# Patient Record
Sex: Male | Born: 1955 | Race: Black or African American | Hispanic: No | Marital: Married | Smoking: Former smoker
Health system: Southern US, Community
[De-identification: ages and names within clinical notes are randomized; demographics above are authoritative.]

## PROBLEM LIST (undated history)

## (undated) DIAGNOSIS — I2510 Atherosclerotic heart disease of native coronary artery without angina pectoris: Secondary | ICD-10-CM

## (undated) DIAGNOSIS — E7800 Pure hypercholesterolemia, unspecified: Secondary | ICD-10-CM

## (undated) DIAGNOSIS — K219 Gastro-esophageal reflux disease without esophagitis: Secondary | ICD-10-CM

## (undated) DIAGNOSIS — L02213 Cutaneous abscess of chest wall: Secondary | ICD-10-CM

## (undated) HISTORY — DX: Cutaneous abscess of chest wall: L02.213

---

## 2005-05-17 HISTORY — PX: COLONOSCOPY: SHX174

## 2010-05-17 DIAGNOSIS — L02213 Cutaneous abscess of chest wall: Secondary | ICD-10-CM

## 2010-05-17 HISTORY — DX: Cutaneous abscess of chest wall: L02.213

## 2010-11-11 ENCOUNTER — Encounter (INDEPENDENT_AMBULATORY_CARE_PROVIDER_SITE_OTHER): Payer: Self-pay | Admitting: General Surgery

## 2010-11-12 ENCOUNTER — Ambulatory Visit (INDEPENDENT_AMBULATORY_CARE_PROVIDER_SITE_OTHER): Payer: BC Managed Care – PPO | Admitting: General Surgery

## 2010-11-12 ENCOUNTER — Encounter (INDEPENDENT_AMBULATORY_CARE_PROVIDER_SITE_OTHER): Payer: Self-pay | Admitting: General Surgery

## 2010-11-12 VITALS — Temp 97.1°F

## 2010-11-12 DIAGNOSIS — L02213 Cutaneous abscess of chest wall: Secondary | ICD-10-CM

## 2010-11-12 DIAGNOSIS — L02219 Cutaneous abscess of trunk, unspecified: Secondary | ICD-10-CM

## 2010-11-12 NOTE — Patient Instructions (Signed)
Return if knot reforms at site.

## 2010-11-12 NOTE — Progress Notes (Signed)
Subjective:     Patient ID: Jeffrey [name], male   DOB: 12/12/1955, 55 y.o.   MRN: [id number]    Temp(Src) 97.1 F (36.2 C) (Temporal)    HPI He is here for followup status post incision and drainage of his chest wall abscess. He states the wound has healed.  Review of Systems     Objective:   Physical Exam Chest: Well healed small left chest wall scar with no nodules.   Assessment:     Left chest wall abscess status post incision and drainage. Wound is healed. This most likely was an infected sebaceous cyst.   Plan:  Return to see me if a nodule reoccurs.

## 2015-11-14 DIAGNOSIS — Z125 Encounter for screening for malignant neoplasm of prostate: Secondary | ICD-10-CM | POA: Diagnosis not present

## 2015-11-14 DIAGNOSIS — Z1389 Encounter for screening for other disorder: Secondary | ICD-10-CM | POA: Diagnosis not present

## 2015-11-14 DIAGNOSIS — Z0000 Encounter for general adult medical examination without abnormal findings: Secondary | ICD-10-CM | POA: Diagnosis not present

## 2015-12-11 ENCOUNTER — Other Ambulatory Visit: Payer: Self-pay | Admitting: Gastroenterology

## 2015-12-25 DIAGNOSIS — E782 Mixed hyperlipidemia: Secondary | ICD-10-CM | POA: Diagnosis not present

## 2016-01-21 DIAGNOSIS — R079 Chest pain, unspecified: Secondary | ICD-10-CM | POA: Diagnosis not present

## 2016-01-21 DIAGNOSIS — K219 Gastro-esophageal reflux disease without esophagitis: Secondary | ICD-10-CM | POA: Diagnosis not present

## 2016-02-03 ENCOUNTER — Encounter (HOSPITAL_COMMUNITY): Payer: Self-pay | Admitting: *Deleted

## 2016-02-10 ENCOUNTER — Encounter (HOSPITAL_COMMUNITY): Payer: Self-pay | Admitting: *Deleted

## 2016-02-10 ENCOUNTER — Encounter (HOSPITAL_COMMUNITY)
Admission: RE | Disposition: A | Discharge status: Home / Self Care | Payer: Self-pay | Source: Ambulatory Visit | Attending: Gastroenterology

## 2016-02-10 ENCOUNTER — Ambulatory Visit (HOSPITAL_COMMUNITY): Payer: BLUE CROSS/BLUE SHIELD | Admitting: Anesthesiology

## 2016-02-10 ENCOUNTER — Ambulatory Visit (HOSPITAL_COMMUNITY)
Admission: RE | Admit: 2016-02-10 | Discharge: 2016-02-10 | Disposition: A | Discharge status: Home / Self Care | Payer: BLUE CROSS/BLUE SHIELD | Source: Ambulatory Visit | Attending: Gastroenterology | Admitting: Gastroenterology

## 2016-02-10 DIAGNOSIS — K219 Gastro-esophageal reflux disease without esophagitis: Secondary | ICD-10-CM | POA: Diagnosis not present

## 2016-02-10 DIAGNOSIS — E7800 Pure hypercholesterolemia, unspecified: Secondary | ICD-10-CM | POA: Diagnosis not present

## 2016-02-10 DIAGNOSIS — D120 Benign neoplasm of cecum: Secondary | ICD-10-CM | POA: Diagnosis not present

## 2016-02-10 DIAGNOSIS — K635 Polyp of colon: Secondary | ICD-10-CM | POA: Insufficient documentation

## 2016-02-10 DIAGNOSIS — Z6834 Body mass index (BMI) 34.0-34.9, adult: Secondary | ICD-10-CM | POA: Diagnosis not present

## 2016-02-10 DIAGNOSIS — E669 Obesity, unspecified: Secondary | ICD-10-CM | POA: Diagnosis not present

## 2016-02-10 DIAGNOSIS — Z1211 Encounter for screening for malignant neoplasm of colon: Secondary | ICD-10-CM | POA: Insufficient documentation

## 2016-02-10 DIAGNOSIS — E783 Hyperchylomicronemia: Secondary | ICD-10-CM | POA: Insufficient documentation

## 2016-02-10 DIAGNOSIS — D122 Benign neoplasm of ascending colon: Secondary | ICD-10-CM | POA: Diagnosis not present

## 2016-02-10 DIAGNOSIS — R9431 Abnormal electrocardiogram [ECG] [EKG]: Secondary | ICD-10-CM | POA: Insufficient documentation

## 2016-02-10 HISTORY — PX: COLONOSCOPY WITH PROPOFOL: SHX5780

## 2016-02-10 HISTORY — DX: Gastro-esophageal reflux disease without esophagitis: K21.9

## 2016-02-10 HISTORY — DX: Pure hypercholesterolemia, unspecified: E78.00

## 2016-02-10 SURGERY — COLONOSCOPY WITH PROPOFOL
Anesthesia: Monitor Anesthesia Care

## 2016-02-10 MED ORDER — LIDOCAINE 2% (20 MG/ML) 5 ML SYRINGE
INTRAMUSCULAR | Status: AC
  Filled 2016-02-10: qty 5

## 2016-02-10 MED ORDER — LIDOCAINE 2% (20 MG/ML) 5 ML SYRINGE
INTRAMUSCULAR | Status: DC | PRN
  Administered 2016-02-10: 50 mg via INTRAVENOUS

## 2016-02-10 MED ORDER — PROPOFOL 10 MG/ML IV BOLUS
INTRAVENOUS | Status: AC
  Filled 2016-02-10: qty 60

## 2016-02-10 MED ORDER — PROPOFOL 500 MG/50ML IV EMUL
INTRAVENOUS | Status: DC | PRN
  Administered 2016-02-10: 125 ug/kg/min via INTRAVENOUS

## 2016-02-10 MED ORDER — LACTATED RINGERS IV SOLN
INTRAVENOUS | Status: DC
  Administered 2016-02-10: 09:00:00 via INTRAVENOUS

## 2016-02-10 MED ORDER — SODIUM CHLORIDE 0.9 % IV SOLN: INTRAVENOUS | Status: DC

## 2016-02-10 MED ORDER — PROPOFOL 10 MG/ML IV BOLUS
INTRAVENOUS | Status: DC | PRN
  Administered 2016-02-10: 20 mg via INTRAVENOUS
  Administered 2016-02-10: 40 mg via INTRAVENOUS

## 2016-02-10 SURGICAL SUPPLY — 21 items

## 2016-02-10 NOTE — Transfer of Care (Signed)
Immediate Anesthesia Transfer of Care Note  Patient: Jeffrey [name]  Procedure(s) Performed: Procedure(s): COLONOSCOPY WITH PROPOFOL (N/A)  Patient Location: PACU and Endoscopy Unit  Anesthesia Type:MAC  Level of Consciousness: sedated and patient cooperative  Airway & Oxygen Therapy: Patient Spontanous Breathing and Patient connected to face mask oxygen  Post-op Assessment: Report given to RN and Post -op Vital signs reviewed and stable  Post vital signs: Reviewed and stable  Last Vitals:  Vitals:   02/10/16 0820  BP: 126/73  Pulse: 61  Resp: (!) 21  Temp: 36.4 C    Last Pain:  Vitals:   02/10/16 0905  TempSrc: Oral         Complications: No apparent anesthesia complications

## 2016-02-10 NOTE — Anesthesia Postprocedure Evaluation (Signed)
Anesthesia Post Note  Patient: Jeffrey [name]  Procedure(s) Performed: Procedure(s) (LRB): COLONOSCOPY WITH PROPOFOL (N/A)  Patient location during evaluation: PACU Anesthesia Type: MAC Level of consciousness: awake and alert Pain management: pain level controlled Vital Signs Assessment: post-procedure vital signs reviewed and stable Respiratory status: spontaneous breathing, nonlabored ventilation, respiratory function stable and patient connected to nasal cannula oxygen Cardiovascular status: stable and blood pressure returned to baseline Anesthetic complications: no    Last Vitals:  Vitals:   02/10/16 1000 02/10/16 1010  BP: (!) 145/92 124/79  Pulse: (!) 57 (!) 55  Resp: 18 16  Temp:      Last Pain:  Vitals:   02/10/16 0948  TempSrc: Oral                 [name]

## 2016-02-10 NOTE — Op Note (Signed)
[facility] Patient Name: Jeffrey [name] Procedure Date: 02/10/2016 MRN: [id number] Attending MD: Garlan Fair , MD Date of Birth: 11-19-1955 CSN: [id number] Age: 60 Admit Type: Outpatient Procedure:                Colonoscopy Indications:              Screening for colorectal malignant neoplasm Providers:                Garlan Fair, MD, Laverta Baltimore RN, RN, Alfonso Patten, Technician, Dione Booze, CRNA Referring MD:              Medicines:                Propofol per Anesthesia Complications:            No immediate complications. Estimated Blood Loss:     Estimated blood loss: none. Procedure:                Pre-Anesthesia Assessment:                           - Prior to the procedure, a History and Physical                            was performed, and patient medications and                            allergies were reviewed. The patient's tolerance of                            previous anesthesia was also reviewed. The risks                            and benefits of the procedure and the sedation                            options and risks were discussed with the patient.                            All questions were answered, and informed consent                            was obtained. Prior Anticoagulants: The patient has                            taken aspirin, last dose was 7 days prior to                            procedure. ASA Grade Assessment: II - A patient                            with mild systemic disease. After reviewing the  risks and benefits, the patient was deemed in                            satisfactory condition to undergo the procedure.                           After obtaining informed consent, the colonoscope                            was passed under direct vision. Throughout the                            procedure, the patient's blood pressure, pulse, and                     oxygen saturations were monitored continuously. The                            [id number] scope was introduced through                            the anus and advanced to the the cecum, identified                            by appendiceal orifice and ileocecal valve. The                            colonoscopy was somewhat difficult due to                            significant looping. The patient tolerated the                            procedure well. The quality of the bowel                            preparation was good. The terminal ileum, the                            ileocecal valve, the appendiceal orifice and the                            rectum were photographed. Scope In: 9:10:58 AM Scope Out: 9:42:56 AM Scope Withdrawal Time: 0 hours 17 minutes 13 seconds  Total Procedure Duration: 0 hours 31 minutes 58 seconds  Findings:      The perianal and digital rectal examinations were normal.      A 7 mm polyp was found in the cecum. The polyp was sessile. The polyp       was removed with a cold snare. Resection and retrieval were complete.      A 7 mm polyp was found in the cecum. The polyp was sessile. The polyp       was removed with a hot snare. Resection and retrieval were complete.      A 4 mm polyp was found in the ascending colon. The  polyp was sessile.       The polyp was removed with a cold snare. Resection and retrieval were       complete.      The exam was otherwise without abnormality. Impression:               - One 7 mm polyp in the cecum, removed with a cold                            snare. Resected and retrieved.                           - One 7 mm polyp in the cecum, removed with a hot                            snare. Resected and retrieved.                           - One 4 mm polyp in the ascending colon, removed                            with a cold snare. Resected and retrieved.                           - The examination was  otherwise normal. Moderate Sedation:      N/A- Per Anesthesia Care Recommendation:           - Patient has a contact number available for                            emergencies. The signs and symptoms of potential                            delayed complications were discussed with the                            patient. Return to normal activities tomorrow.                            Written discharge instructions were provided to the                            patient.                           - Repeat colonoscopy date to be determined after                            pending pathology results are reviewed for                            surveillance.                           - Resume previous diet.                           -  Continue present medications. Procedure Code(s):        --- Professional ---                           [phone], Colonoscopy, flexible; with removal of                            tumor(s), polyp(s), or other lesion(s) by snare                            technique Diagnosis Code(s):        --- Professional ---                           Z12.11, Encounter for screening for malignant                            neoplasm of colon                           D12.0, Benign neoplasm of cecum                           D12.2, Benign neoplasm of ascending colon CPT copyright 2016 American [employer] Association. All rights reserved. The codes documented in this report are preliminary and upon coder review may  be revised to meet current compliance requirements. Earle Gell, MD Garlan Fair, MD 02/10/2016 9:51:06 AM This report has been signed electronically. Number of Addenda: 0

## 2016-02-10 NOTE — Anesthesia Preprocedure Evaluation (Signed)
Anesthesia Evaluation  Patient identified by MRN, date of birth, ID band Patient awake    Reviewed: Allergy & Precautions, NPO status , Patient's Chart, lab work & pertinent test results  Airway Mallampati: II  TM Distance: >3 FB Neck ROM: Full    Dental no notable dental hx.    Pulmonary neg pulmonary ROS,    Pulmonary exam normal breath sounds clear to auscultation       Cardiovascular negative cardio ROS Normal cardiovascular exam Rhythm:Regular Rate:Normal     Neuro/Psych negative neurological ROS  negative psych ROS   GI/Hepatic negative GI ROS, Neg liver ROS, GERD  Medicated,  Endo/Other  negative endocrine ROS  Renal/GU negative Renal ROS  negative genitourinary   Musculoskeletal negative musculoskeletal ROS (+)   Abdominal (+) + obese,   Peds negative pediatric ROS (+)  Hematology negative hematology ROS (+)   Anesthesia Other Findings   Reproductive/Obstetrics negative OB ROS                             Anesthesia Physical Anesthesia Plan  ASA: II  Anesthesia Plan: MAC   Post-op Pain Management:    Induction: Intravenous  Airway Management Planned: Natural Airway  Additional Equipment:   Intra-op Plan:   Post-operative Plan:   Informed Consent: I have reviewed the patients History and Physical, chart, labs and discussed the procedure including the risks, benefits and alternatives for the proposed anesthesia with the patient or authorized representative who has indicated his/her understanding and acceptance.   Dental advisory given  Plan Discussed with: CRNA  Anesthesia Plan Comments:         Anesthesia Quick Evaluation

## 2016-02-10 NOTE — Discharge Instructions (Signed)

## 2016-02-10 NOTE — H&P (Signed)
Procedure: Screening colonoscopy. Normal baseline screening colonoscopy was performed on 09/29/2005  History: The patient is a 60 year old male born 02/08/1956. He is scheduled to undergo a repeat screening colonoscopy today.  Medication allergies: None  Past medical history: Hypercholesterolemia. Gastroesophageal reflux.  Family history: Negative for colon cancer  Exam: The patient is alert and lying comfortably on the endoscopy stretcher. Abdomen is soft and nontender to palpation. Lungs are clear to auscultation. Cardiac exam reveals a regular rhythm.  Plan: Proceed with screening colonoscopy

## 2016-02-10 NOTE — Anesthesia Procedure Notes (Signed)
Procedure Name: MAC Date/Time: 02/10/2016 9:06 AM Performed by: Dione Booze Pre-anesthesia Checklist: Patient identified, Emergency Drugs available, Suction available and Patient being monitored Patient Re-evaluated:Patient Re-evaluated prior to inductionOxygen Delivery Method: Simple face mask Placement Confirmation: positive ETCO2

## 2016-02-11 ENCOUNTER — Encounter: Payer: Self-pay | Admitting: Interventional Cardiology

## 2016-02-11 ENCOUNTER — Ambulatory Visit (INDEPENDENT_AMBULATORY_CARE_PROVIDER_SITE_OTHER): Payer: BLUE CROSS/BLUE SHIELD | Admitting: Interventional Cardiology

## 2016-02-11 VITALS — BP 152/90 | HR 66 | Ht 71.0 in | Wt 257.0 lb

## 2016-02-11 DIAGNOSIS — R0789 Other chest pain: Secondary | ICD-10-CM | POA: Diagnosis not present

## 2016-02-11 DIAGNOSIS — R079 Chest pain, unspecified: Secondary | ICD-10-CM | POA: Insufficient documentation

## 2016-02-11 DIAGNOSIS — R9431 Abnormal electrocardiogram [ECG] [EKG]: Secondary | ICD-10-CM | POA: Diagnosis not present

## 2016-02-11 DIAGNOSIS — E785 Hyperlipidemia, unspecified: Secondary | ICD-10-CM

## 2016-02-11 NOTE — Progress Notes (Addendum)
Cardiology Office Note    Date:  02/11/2016   ID:  Jeffrey [name], DOB [birth year], MRN [id number]  PCP:  Wenda Low, MD  Cardiologist: Sinclair Grooms, MD   Chief Complaint  Patient presents with  . Coronary Artery Disease  . Abnormal ECG    History of Present Illness:  Jeffrey [name] is a 60 y.o. male here for chest pain and abnormal EKG evaluation  60 year old who is an avid golfer. Over the past 3 months is noted recurring postprandial midsternal discomfort. One episode lasted nearly 3 hours. He feels that the discomfort improves with Tums. He only has it at night. He never has it playing golf. He is a good golfer and doesn't do that much walking when he plays because he is "in the [place]". No prior history of heart disease. He denies leg fatigue and discomfort with activity  He spoke to his physician, Dr. Deforest Hoyles about the discomfort. An EKG was done and was different than his prior tracing 17 years previous. Mrs. Kellner also feels that there is a correlation between when he began simvastatin and the onset of the chest discomfort.  Past Medical History:  Diagnosis Date  . Chest wall abscess 2012  . Elevated cholesterol   . GERD (gastroesophageal reflux disease)     Past Surgical History:  Procedure Laterality Date  . colonscopy      Current Medications: Outpatient Medications Prior to Visit  Medication Sig Dispense Refill  . aspirin 81 MG tablet Take 81 mg by mouth daily.      . famotidine (PEPCID) 10 MG tablet Take 10 mg by mouth daily as needed for heartburn or indigestion.    . simvastatin (ZOCOR) 10 MG tablet Take 10 mg by mouth every morning.     No facility-administered medications prior to visit.      Allergies:   Review of patient's allergies indicates no known allergies.   Social History   Social History  . Marital status: Married    Spouse name: N/A  . Number of children: N/A  . Years of education: N/A   Social History Main Topics  . Smoking  status: Passive Smoke Exposure - Never Smoker    Types: Cigars  . Smokeless tobacco: Never Used     Comment: occ cigars  . Alcohol use Yes     Comment: socially  . Drug use: No  . Sexual activity: Not Asked   Other Topics Concern  . None   Social History Narrative  . None     Family History:  The patient's family history includes Hypertension in his sister; Stroke in his brother.   ROS:   Please see the history of present illness.    Possible prior history of reflux or response to an acid therapy. History of sciatica. Erectile dysfunction. Hyperlipidemia.  All other systems reviewed and are negative.   PHYSICAL EXAM:   VS:  BP (!) 152/90   Pulse 66   Ht 5\' 11"  (1.803 m)   Wt 257 lb (116.6 kg)   BMI 35.84 kg/m    GEN: Well nourished, well developed, in no acute distress  HEENT: normal  Neck: no JVD, carotid bruits, or masses Cardiac: RRR; no murmurs, rubs, or gallops,no edema  Respiratory:  clear to auscultation bilaterally, normal work of breathing GI: soft, nontender, nondistended, + BS MS: no deformity or atrophy  Skin: warm and dry, no rash Neuro:  Alert and Oriented x 3, Strength and sensation are intact Psych: euthymic  mood, full affect  Wt Readings from Last 3 Encounters:  02/11/16 257 lb (116.6 kg)  02/10/16 245 lb (111.1 kg)      Studies/Labs Reviewed:   EKG:  EKG  Normal sinus rhythm, left axis deviation, ischemic T-wave abnormality in II, III, aVF, and V5 through V6. Small Q waves are noted.  Recent Labs: No results found for requested labs within last 8760 hours.   Lipid Panel No results found for: CHOL, TRIG, HDL, CHOLHDL, VLDL, LDLCALC, LDLDIRECT  Additional studies/ records that were reviewed today include:  Reviewed the EKG from [facility] from 2000, it was totally normal. The above changes are new.    ASSESSMENT:    1. Abnormal ECG   2. Chest discomfort   3. Hyperlipidemia      PLAN:  In order of problems listed above:  1. Could  represent ischemic injury to the heart. No ongoing discomfort. Definite change in appearance compared to 17 years ago. Ischemia is a concern. 2. The discomfort is only postprandial. It is not exertion related. Under the circumstances however, I am concerned about coronary artery disease. He needs a stress myocardial perfusion study done to exclude ischemia. If abnormal he would need to have coronary angiography. 3. Continue statin therapy. LDL target is 70. 81 mg aspirin daily is recommended.   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 Ejection fraction 43%. No significant areas of ischemia. Because of low EF and perfusion abnormality of the study is at least moderate risk.  No chest discomfort since the initial office visit noted above. Abnormality on nuclear perfusion imaging is consistent with the abnormality noted on EKG which suggested inferolateral infarction.  The patient was counseled to undergo left heart catheterization, coronary angiography, and possible percutaneous coronary intervention with stent implantation. The procedural risks and benefits were discussed in detail. The risks discussed included death, stroke, myocardial infarction, life-threatening bleeding, limb ischemia, kidney injury, allergy, and possible emergency cardiac surgery. The risk of these significant complications were estimated to occur less than 1% of the time. After discussion, the patient has agreed to proceed.   Medication Adjustments/Labs and Tests Ordered: Current medicines are reviewed at length with the patient today.  Concerns regarding medicines are outlined above.  Medication changes, Labs and Tests ordered today are listed in the Patient Instructions below. Patient Instructions  Medication Instructions:  Your physician recommends that you continue on your current medications as directed. Please refer to the Current Medication list given to  you today.  Please resume Aspirin 81mg  daily  Labwork: None ordered  Testing/Procedures: Your physician has requested that you have en exercise stress myoview. For further information please visit [email or web address]. Please follow instruction sheet, as given. (Please schedule asap)    Follow-Up: Your physician recommends that you schedule a follow-up appointment as needed pending test results   Any Other Special Instructions Will Be Listed Below (If Applicable).     If you need a refill on your cardiac medications before your next appointment, please call your pharmacy.      Signed, Sinclair Grooms, MD  02/11/2016 10:28 AM    [address], [place], [place]  [address] Phone: [phone]; Fax: [phone]

## 2016-02-11 NOTE — Patient Instructions (Signed)
Medication Instructions:  Your physician recommends that you continue on your current medications as directed. Please refer to the Current Medication list given to you today.  Please resume Aspirin 81mg  daily  Labwork: None ordered  Testing/Procedures: Your physician has requested that you have en exercise stress myoview. For further information please visit [email or web address]. Please follow instruction sheet, as given. (Please schedule asap)    Follow-Up: Your physician recommends that you schedule a follow-up appointment as needed pending test results   Any Other Special Instructions Will Be Listed Below (If Applicable).     If you need a refill on your cardiac medications before your next appointment, please call your pharmacy.

## 2016-02-12 DIAGNOSIS — Z23 Encounter for immunization: Secondary | ICD-10-CM | POA: Diagnosis not present

## 2016-02-12 DIAGNOSIS — K219 Gastro-esophageal reflux disease without esophagitis: Secondary | ICD-10-CM | POA: Diagnosis not present

## 2016-02-16 ENCOUNTER — Telehealth (HOSPITAL_COMMUNITY): Payer: Self-pay | Admitting: *Deleted

## 2016-02-16 NOTE — Telephone Encounter (Signed)
Left message on voicemail per DPR in reference to upcoming appointment scheduled on  02/19/16 with detailed instructions given per Myocardial Perfusion Study Information Sheet for the test. LM to arrive 15 minutes early, and that it is imperative to arrive on time for appointment to keep from having the test rescheduled. If you need to cancel or reschedule your appointment, please call the office within 24 hours of your appointment. Failure to do so may result in a cancellation of your appointment, and a $50 no show fee. Phone number given for call back for any questions.   [place], Lucindia Lemley Jacqueline    

## 2016-02-19 ENCOUNTER — Other Ambulatory Visit: Payer: Self-pay | Admitting: Interventional Cardiology

## 2016-02-19 ENCOUNTER — Encounter: Payer: Self-pay | Admitting: *Deleted

## 2016-02-19 ENCOUNTER — Other Ambulatory Visit: Payer: Self-pay | Admitting: *Deleted

## 2016-02-19 ENCOUNTER — Ambulatory Visit (HOSPITAL_COMMUNITY): Payer: BLUE CROSS/BLUE SHIELD | Attending: Cardiology

## 2016-02-19 ENCOUNTER — Other Ambulatory Visit: Payer: BLUE CROSS/BLUE SHIELD | Admitting: *Deleted

## 2016-02-19 DIAGNOSIS — R9431 Abnormal electrocardiogram [ECG] [EKG]: Secondary | ICD-10-CM

## 2016-02-19 DIAGNOSIS — I501 Left ventricular failure: Secondary | ICD-10-CM | POA: Insufficient documentation

## 2016-02-19 DIAGNOSIS — I517 Cardiomegaly: Secondary | ICD-10-CM | POA: Insufficient documentation

## 2016-02-19 DIAGNOSIS — Z01812 Encounter for preprocedural laboratory examination: Secondary | ICD-10-CM

## 2016-02-19 DIAGNOSIS — I252 Old myocardial infarction: Secondary | ICD-10-CM | POA: Diagnosis not present

## 2016-02-19 DIAGNOSIS — R9439 Abnormal result of other cardiovascular function study: Secondary | ICD-10-CM

## 2016-02-19 LAB — BASIC METABOLIC PANEL
BUN: 13 mg/dL (ref 7–25)
CALCIUM: 9.6 mg/dL (ref 8.6–10.3)
CHLORIDE: 102 mmol/L (ref 98–110)
CO2: 27 mmol/L (ref 20–31)
CREATININE: 1.3 mg/dL — AB (ref 0.70–1.25)
Glucose, Bld: 98 mg/dL (ref 65–99)
Potassium: 4.1 mmol/L (ref 3.5–5.3)
Sodium: 138 mmol/L (ref 135–146)

## 2016-02-19 LAB — MYOCARDIAL PERFUSION IMAGING
CHL CUP MPHR: 158 {beats}/min
CHL CUP NUCLEAR SDS: 2
CSEPED: 6 min
CSEPEW: 7 METS
Exercise duration (sec): 0 s
LHR: 0.38
LV dias vol: 170 mL (ref 62–150)
LV sys vol: 99 mL
Peak HR: 141 {beats}/min
Percent HR: 89 %
Rest HR: 51 {beats}/min
SRS: 23
SSS: 25
TID: 1.01

## 2016-02-19 LAB — PROTIME-INR
INR: 1
Prothrombin Time: 11.1 s (ref 9.0–11.5)

## 2016-02-19 LAB — CBC
HCT: 41.9 % (ref 38.5–50.0)
Hemoglobin: 14 g/dL (ref 13.2–17.1)
MCH: 31.8 pg (ref 27.0–33.0)
MCHC: 33.4 g/dL (ref 32.0–36.0)
MCV: 95.2 fL (ref 80.0–100.0)
MPV: 11.2 fL (ref 7.5–12.5)
PLATELETS: 241 10*3/uL (ref 140–400)
RBC: 4.4 MIL/uL (ref 4.20–5.80)
RDW: 13.4 % (ref 11.0–15.0)
WBC: 6.8 10*3/uL (ref 3.8–10.8)

## 2016-02-19 MED ORDER — ATORVASTATIN CALCIUM 40 MG PO TABS: 40.0000 mg | ORAL_TABLET | Freq: Every day | ORAL | 3 refills | Status: DC

## 2016-02-19 MED ORDER — TECHNETIUM TC 99M TETROFOSMIN IV KIT
32.8000 | PACK | Freq: Once | INTRAVENOUS | Status: AC | PRN
  Administered 2016-02-19: 32.8 via INTRAVENOUS
  Filled 2016-02-19: qty 33

## 2016-02-19 MED ORDER — TECHNETIUM TC 99M TETROFOSMIN IV KIT
10.6000 | PACK | Freq: Once | INTRAVENOUS | Status: AC | PRN
  Administered 2016-02-19: 11 via INTRAVENOUS
  Filled 2016-02-19: qty 11

## 2016-02-26 ENCOUNTER — Ambulatory Visit (HOSPITAL_COMMUNITY)
Admission: RE | Admit: 2016-02-26 | Discharge: 2016-02-27 | Disposition: A | Discharge status: Home / Self Care | Payer: BLUE CROSS/BLUE SHIELD | Source: Ambulatory Visit | Attending: Interventional Cardiology | Admitting: Interventional Cardiology

## 2016-02-26 ENCOUNTER — Other Ambulatory Visit: Payer: Self-pay

## 2016-02-26 ENCOUNTER — Encounter (HOSPITAL_COMMUNITY)
Admission: RE | Disposition: A | Discharge status: Home / Self Care | Payer: Self-pay | Source: Ambulatory Visit | Attending: Interventional Cardiology

## 2016-02-26 ENCOUNTER — Encounter (HOSPITAL_COMMUNITY): Payer: Self-pay | Admitting: General Practice

## 2016-02-26 DIAGNOSIS — R079 Chest pain, unspecified: Secondary | ICD-10-CM | POA: Diagnosis present

## 2016-02-26 DIAGNOSIS — I252 Old myocardial infarction: Secondary | ICD-10-CM | POA: Diagnosis not present

## 2016-02-26 DIAGNOSIS — Z955 Presence of coronary angioplasty implant and graft: Secondary | ICD-10-CM

## 2016-02-26 DIAGNOSIS — Z7982 Long term (current) use of aspirin: Secondary | ICD-10-CM | POA: Insufficient documentation

## 2016-02-26 DIAGNOSIS — E785 Hyperlipidemia, unspecified: Secondary | ICD-10-CM | POA: Diagnosis present

## 2016-02-26 DIAGNOSIS — I25119 Atherosclerotic heart disease of native coronary artery with unspecified angina pectoris: Secondary | ICD-10-CM

## 2016-02-26 DIAGNOSIS — Z79899 Other long term (current) drug therapy: Secondary | ICD-10-CM | POA: Insufficient documentation

## 2016-02-26 DIAGNOSIS — R9439 Abnormal result of other cardiovascular function study: Secondary | ICD-10-CM | POA: Diagnosis present

## 2016-02-26 DIAGNOSIS — R9431 Abnormal electrocardiogram [ECG] [EKG]: Secondary | ICD-10-CM | POA: Diagnosis present

## 2016-02-26 DIAGNOSIS — I2510 Atherosclerotic heart disease of native coronary artery without angina pectoris: Secondary | ICD-10-CM | POA: Diagnosis not present

## 2016-02-26 HISTORY — PX: CARDIAC CATHETERIZATION: SHX172

## 2016-02-26 HISTORY — PX: CORONARY ANGIOPLASTY WITH STENT PLACEMENT: SHX49

## 2016-02-26 LAB — CREATININE, SERUM: Creatinine, Ser: 1.24 mg/dL (ref 0.61–1.24)

## 2016-02-26 LAB — CBC
HEMATOCRIT: 40.6 % (ref 39.0–52.0)
HEMOGLOBIN: 13.5 g/dL (ref 13.0–17.0)
MCH: 32.3 pg (ref 26.0–34.0)
MCHC: 33.3 g/dL (ref 30.0–36.0)
MCV: 97.1 fL (ref 78.0–100.0)
Platelets: 209 10*3/uL (ref 150–400)
RBC: 4.18 MIL/uL — ABNORMAL LOW (ref 4.22–5.81)
RDW: 13.3 % (ref 11.5–15.5)
WBC: 6 10*3/uL (ref 4.0–10.5)

## 2016-02-26 LAB — POCT ACTIVATED CLOTTING TIME
Activated Clotting Time: 263 seconds
Activated Clotting Time: 351 seconds

## 2016-02-26 SURGERY — LEFT HEART CATH AND CORONARY ANGIOGRAPHY
Anesthesia: LOCAL

## 2016-02-26 MED ORDER — HEPARIN SODIUM (PORCINE) 1000 UNIT/ML IJ SOLN
INTRAMUSCULAR | Status: AC
  Filled 2016-02-26: qty 1

## 2016-02-26 MED ORDER — SODIUM CHLORIDE 0.9% FLUSH
3.0000 mL | Freq: Two times a day (BID) | INTRAVENOUS | Status: DC
  Administered 2016-02-26 – 2016-02-27 (×2): 3 mL via INTRAVENOUS

## 2016-02-26 MED ORDER — LIDOCAINE HCL (PF) 1 % IJ SOLN
INTRAMUSCULAR | Status: DC | PRN
  Administered 2016-02-26: 2 mL

## 2016-02-26 MED ORDER — IOPAMIDOL (ISOVUE-370) INJECTION 76%
INTRAVENOUS | Status: AC
  Filled 2016-02-26: qty 100

## 2016-02-26 MED ORDER — FENTANYL CITRATE (PF) 100 MCG/2ML IJ SOLN
INTRAMUSCULAR | Status: AC
  Filled 2016-02-26: qty 2

## 2016-02-26 MED ORDER — MIDAZOLAM HCL 2 MG/2ML IJ SOLN
INTRAMUSCULAR | Status: AC
Start: 2016-02-26 — End: 2016-02-26
  Filled 2016-02-26: qty 2

## 2016-02-26 MED ORDER — VERAPAMIL HCL 2.5 MG/ML IV SOLN
INTRAVENOUS | Status: DC | PRN
  Administered 2016-02-26: 10 mL via INTRA_ARTERIAL

## 2016-02-26 MED ORDER — TICAGRELOR 90 MG PO TABS
ORAL_TABLET | ORAL | Status: AC
  Filled 2016-02-26: qty 2

## 2016-02-26 MED ORDER — ASPIRIN 81 MG PO CHEW
81.0000 mg | CHEWABLE_TABLET | Freq: Every day | ORAL | Status: DC
  Administered 2016-02-27: 08:00:00 81 mg via ORAL
  Filled 2016-02-26: qty 1

## 2016-02-26 MED ORDER — NITROGLYCERIN IN D5W 200-5 MCG/ML-% IV SOLN
INTRAVENOUS | Status: DC | PRN
  Administered 2016-02-26: 10 ug/min via INTRAVENOUS

## 2016-02-26 MED ORDER — NITROGLYCERIN IN D5W 200-5 MCG/ML-% IV SOLN: 10.0000 ug/min | INTRAVENOUS | Status: DC

## 2016-02-26 MED ORDER — IOPAMIDOL (ISOVUE-370) INJECTION 76%
INTRAVENOUS | Status: AC
  Filled 2016-02-26: qty 50

## 2016-02-26 MED ORDER — SODIUM CHLORIDE 0.9 % WEIGHT BASED INFUSION: 1.0000 mL/kg/h | INTRAVENOUS | Status: DC

## 2016-02-26 MED ORDER — TICAGRELOR 90 MG PO TABS
90.0000 mg | ORAL_TABLET | Freq: Two times a day (BID) | ORAL | Status: DC
  Administered 2016-02-27 (×2): 90 mg via ORAL
  Filled 2016-02-26 (×2): qty 1

## 2016-02-26 MED ORDER — FENTANYL CITRATE (PF) 100 MCG/2ML IJ SOLN
INTRAMUSCULAR | Status: DC | PRN
  Administered 2016-02-26: 25 ug via INTRAVENOUS
  Administered 2016-02-26: 50 ug via INTRAVENOUS

## 2016-02-26 MED ORDER — BIVALIRUDIN 250 MG IV SOLR
INTRAVENOUS | Status: AC
  Filled 2016-02-26: qty 250

## 2016-02-26 MED ORDER — IOPAMIDOL (ISOVUE-370) INJECTION 76%
INTRAVENOUS | Status: DC | PRN
  Administered 2016-02-26: 230 mL via INTRA_ARTERIAL

## 2016-02-26 MED ORDER — VERAPAMIL HCL 2.5 MG/ML IV SOLN
INTRAVENOUS | Status: AC
  Filled 2016-02-26: qty 2

## 2016-02-26 MED ORDER — TIROFIBAN HCL IN NACL 5-0.9 MG/100ML-% IV SOLN
INTRAVENOUS | Status: DC | PRN
  Administered 2016-02-26: 0.15 ug/kg/min via INTRAVENOUS

## 2016-02-26 MED ORDER — SODIUM CHLORIDE 0.9% FLUSH: 3.0000 mL | INTRAVENOUS | Status: DC | PRN

## 2016-02-26 MED ORDER — HEPARIN SODIUM (PORCINE) 1000 UNIT/ML IJ SOLN
INTRAMUSCULAR | Status: DC | PRN
  Administered 2016-02-26: 5000 [IU] via INTRAVENOUS

## 2016-02-26 MED ORDER — SODIUM CHLORIDE 0.9 % IV SOLN
250.0000 mL | INTRAVENOUS | Status: DC | PRN
Start: 2016-02-26 — End: 2016-02-27

## 2016-02-26 MED ORDER — TIROFIBAN (AGGRASTAT) BOLUS VIA INFUSION
INTRAVENOUS | Status: DC | PRN
  Administered 2016-02-26: 2892.5 ug via INTRAVENOUS

## 2016-02-26 MED ORDER — LIDOCAINE HCL (PF) 1 % IJ SOLN
INTRAMUSCULAR | Status: AC
  Filled 2016-02-26: qty 30

## 2016-02-26 MED ORDER — NITROGLYCERIN IN D5W 200-5 MCG/ML-% IV SOLN: 5.0000 ug/min | INTRAVENOUS | Status: DC

## 2016-02-26 MED ORDER — SODIUM CHLORIDE 0.9 % IV SOLN
INTRAVENOUS | Status: DC | PRN
  Administered 2016-02-26: 1.75 mg/kg/h via INTRAVENOUS

## 2016-02-26 MED ORDER — BIVALIRUDIN BOLUS VIA INFUSION - CUPID
INTRAVENOUS | Status: DC | PRN
  Administered 2016-02-26: 86.775 mg via INTRAVENOUS

## 2016-02-26 MED ORDER — MIDAZOLAM HCL 2 MG/2ML IJ SOLN
INTRAMUSCULAR | Status: DC | PRN
  Administered 2016-02-26 (×3): 1 mg via INTRAVENOUS

## 2016-02-26 MED ORDER — NITROGLYCERIN 1 MG/10 ML FOR IR/CATH LAB
INTRA_ARTERIAL | Status: DC | PRN
  Administered 2016-02-26 (×2): 200 ug via INTRACORONARY

## 2016-02-26 MED ORDER — ACETAMINOPHEN 325 MG PO TABS: 650.0000 mg | ORAL_TABLET | ORAL | Status: DC | PRN

## 2016-02-26 MED ORDER — NITROGLYCERIN IN D5W 200-5 MCG/ML-% IV SOLN
INTRAVENOUS | Status: AC
  Filled 2016-02-26: qty 250

## 2016-02-26 MED ORDER — MIDAZOLAM HCL 2 MG/2ML IJ SOLN
INTRAMUSCULAR | Status: AC
  Filled 2016-02-26: qty 2

## 2016-02-26 MED ORDER — AMLODIPINE BESYLATE 5 MG PO TABS
5.0000 mg | ORAL_TABLET | Freq: Every day | ORAL | Status: DC
Start: 2016-02-26 — End: 2016-02-27
  Administered 2016-02-26 – 2016-02-27 (×2): 5 mg via ORAL
  Filled 2016-02-26 (×2): qty 1

## 2016-02-26 MED ORDER — OXYCODONE-ACETAMINOPHEN 5-325 MG PO TABS: 1.0000 | ORAL_TABLET | ORAL | Status: DC | PRN

## 2016-02-26 MED ORDER — ASPIRIN 81 MG PO CHEW: 81.0000 mg | CHEWABLE_TABLET | ORAL | Status: DC

## 2016-02-26 MED ORDER — ATORVASTATIN CALCIUM 80 MG PO TABS
80.0000 mg | ORAL_TABLET | Freq: Every day | ORAL | Status: DC
  Administered 2016-02-26: 19:00:00 80 mg via ORAL
  Filled 2016-02-26: qty 1

## 2016-02-26 MED ORDER — HEPARIN (PORCINE) IN NACL 2-0.9 UNIT/ML-% IJ SOLN
INTRAMUSCULAR | Status: DC | PRN
  Administered 2016-02-26: 1000 mL

## 2016-02-26 MED ORDER — TIROFIBAN HCL IN NACL 5-0.9 MG/100ML-% IV SOLN
INTRAVENOUS | Status: AC
  Filled 2016-02-26: qty 100

## 2016-02-26 MED ORDER — HEPARIN SODIUM (PORCINE) 5000 UNIT/ML IJ SOLN
5000.0000 [IU] | Freq: Three times a day (TID) | INTRAMUSCULAR | Status: DC
  Administered 2016-02-26 – 2016-02-27 (×2): 5000 [IU] via SUBCUTANEOUS
  Filled 2016-02-26 (×2): qty 1

## 2016-02-26 MED ORDER — ANGIOPLASTY BOOK
Freq: Once | Status: AC
  Administered 2016-02-26: 20:00:00
  Filled 2016-02-26: qty 1

## 2016-02-26 MED ORDER — HEPARIN (PORCINE) IN NACL 2-0.9 UNIT/ML-% IJ SOLN
INTRAMUSCULAR | Status: AC
  Filled 2016-02-26: qty 1000

## 2016-02-26 MED ORDER — SODIUM CHLORIDE 0.9 % WEIGHT BASED INFUSION: 1.0000 mL/kg/h | INTRAVENOUS | Status: AC

## 2016-02-26 MED ORDER — SODIUM CHLORIDE 0.9 % WEIGHT BASED INFUSION
3.0000 mL/kg/h | INTRAVENOUS | Status: DC
  Administered 2016-02-26: 3 mL/kg/h via INTRAVENOUS

## 2016-02-26 MED ORDER — SODIUM CHLORIDE 0.9 % IV SOLN
250.0000 mL | INTRAVENOUS | Status: DC | PRN
Start: 2016-02-26 — End: 2016-02-26

## 2016-02-26 MED ORDER — TIROFIBAN HCL IN NACL 5-0.9 MG/100ML-% IV SOLN
0.1500 ug/kg/min | INTRAVENOUS | Status: AC
  Filled 2016-02-26 (×2): qty 100

## 2016-02-26 MED ORDER — TICAGRELOR 90 MG PO TABS
ORAL_TABLET | ORAL | Status: DC | PRN
Start: 2016-02-26 — End: 2016-02-26
  Administered 2016-02-26: 180 mg via ORAL

## 2016-02-26 MED ORDER — ONDANSETRON HCL 4 MG/2ML IJ SOLN: 4.0000 mg | Freq: Four times a day (QID) | INTRAMUSCULAR | Status: DC | PRN

## 2016-02-26 MED ORDER — SODIUM CHLORIDE 0.9% FLUSH: 3.0000 mL | Freq: Two times a day (BID) | INTRAVENOUS | Status: DC

## 2016-02-26 SURGICAL SUPPLY — 18 items
BALLN EMERGE MR 3.0X15 (BALLOONS) ×2
BALLN ~~LOC~~ EUPHORA RX 3.75X15 (BALLOONS) ×2
BALLOON EMERGE MR 3.0X15 (BALLOONS) ×1 IMPLANT
BALLOON ~~LOC~~ EUPHORA RX 3.75X15 (BALLOONS) ×1 IMPLANT
CATH 5FR JL3.5 JR4 ANG PIG MP (CATHETERS) ×2 IMPLANT
CATH VISTA GUIDE 6FR XBLAD3.5 (CATHETERS) ×2 IMPLANT
DEVICE RAD COMP TR BAND LRG (VASCULAR PRODUCTS) ×2 IMPLANT
GLIDESHEATH SLEND A-KIT 6F 22G (SHEATH) ×2 IMPLANT
KIT ENCORE 26 ADVANTAGE (KITS) ×2 IMPLANT
KIT HEART LEFT (KITS) ×2 IMPLANT
PACK CARDIAC CATHETERIZATION (CUSTOM PROCEDURE TRAY) ×2 IMPLANT
SHEATH PINNACLE 6F 10CM (SHEATH) IMPLANT
STENT SYNERGY DES 3.5X24 (Permanent Stent) ×2 IMPLANT
TRANSDUCER W/STOPCOCK (MISCELLANEOUS) ×2 IMPLANT
TUBING CIL FLEX 10 FLL-RA (TUBING) ×2 IMPLANT
WIRE ASAHI PROWATER 180CM (WIRE) ×4 IMPLANT
WIRE HI TORQ VERSACORE-J 145CM (WIRE) ×2 IMPLANT
WIRE SAFE-T 1.5MM-J .035X260CM (WIRE) ×2 IMPLANT

## 2016-02-26 NOTE — Interval H&P Note (Signed)
Cath Lab Visit (complete for each Cath Lab visit)  Clinical Evaluation Leading to the Procedure:   ACS: No.  Non-ACS:    Anginal Classification: CCS [name]  Anti-ischemic medical therapy: Minimal Therapy (1 class of medications)  Non-Invasive Test Results: Intermediate-risk stress test findings: cardiac mortality 1-3%/year  Prior CABG: No previous CABG      History and Physical Interval Note:  02/26/2016 12:04 PM  Jeffrey [name]  has presented today for surgery, with the diagnosis of abnormal stress test  The various methods of treatment have been discussed with the patient and family. After consideration of risks, benefits and other options for treatment, the patient has consented to  Procedure(s): Left Heart Cath and Coronary Angiography (N/A) as a surgical intervention .  The patient's history has been reviewed, patient examined, no change in status, stable for surgery.  I have reviewed the patient's chart and labs.  Questions were answered to the patient's satisfaction.     Jeffrey [name]

## 2016-02-26 NOTE — Care Management Note (Signed)
Case Management Note  Patient Details  Name: Jeffrey [name] MRN: [id number] Date of Birth: 1955/09/19  Subjective/Objective:   S/p intervention, NCM awaiting benefit check for brilinta.                   Action/Plan:   Expected Discharge Date:                  Expected Discharge Plan:  Home/Self Care  In-House Referral:     Discharge planning Services  CM Consult  Post Acute Care Choice:    Choice offered to:     DME Arranged:    DME Agency:     HH Arranged:    HH Agency:     Status of Service:  In process, will continue to follow  If discussed at Long Length of Stay Meetings, dates discussed:    Additional Comments:  Zenon Mayo, RN 02/26/2016, 4:42 PM

## 2016-02-26 NOTE — H&P (View-Only) (Signed)
Cardiology Office Note    Date:  02/11/2016   ID:  Jeffrey [name], DOB 03/10/1956, MRN [id number]  PCP:  Jeffrey Low, MD  Cardiologist: Jeffrey Grooms, MD   Chief Complaint  Patient presents with  . Coronary Artery Disease  . Abnormal ECG    History of Present Illness:  Jeffrey [name] is a 60 y.o. male here for chest pain and abnormal EKG evaluation  60 year old who is an avid golfer. Over the past 3 months is noted recurring postprandial midsternal discomfort. One episode lasted nearly 3 hours. He feels that the discomfort improves with Tums. He only has it at night. He never has it playing golf. He is a good golfer and doesn't do that much walking when he plays because he is "in the [place]". No prior history of heart disease. He denies leg fatigue and discomfort with activity  He spoke to his physician, Jeffrey [name] about the discomfort. An EKG was done and was different than his prior tracing 17 years previous. Jeffrey [name] also feels that there is a correlation between when he began simvastatin and the onset of the chest discomfort.  Past Medical History:  Diagnosis Date  . Chest wall abscess 2012  . Elevated cholesterol   . GERD (gastroesophageal reflux disease)     Past Surgical History:  Procedure Laterality Date  . colonscopy      Current Medications: Outpatient Medications Prior to Visit  Medication Sig Dispense Refill  . aspirin 81 MG tablet Take 81 mg by mouth daily.      . famotidine (PEPCID) 10 MG tablet Take 10 mg by mouth daily as needed for heartburn or indigestion.    . simvastatin (ZOCOR) 10 MG tablet Take 10 mg by mouth every morning.     No facility-administered medications prior to visit.      Allergies:   Review of patient's allergies indicates no known allergies.   Social History   Social History  . Marital status: Married    Spouse name: N/A  . Number of children: N/A  . Years of education: N/A   Social History Main Topics  . Smoking  status: Passive Smoke Exposure - Never Smoker    Types: Cigars  . Smokeless tobacco: Never Used     Comment: occ cigars  . Alcohol use Yes     Comment: socially  . Drug use: No  . Sexual activity: Not Asked   Other Topics Concern  . None   Social History Narrative  . None     Family History:  The patient's family history includes Hypertension in his sister; Stroke in his brother.   ROS:   Please see the history of present illness.    Possible prior history of reflux or response to an acid therapy. History of sciatica. Erectile dysfunction. Hyperlipidemia.  All other systems reviewed and are negative.   PHYSICAL EXAM:   VS:  BP (!) 152/90   Pulse 66   Ht 5\' 11"  (1.803 m)   Wt 257 lb (116.6 kg)   BMI 35.84 kg/m    GEN: Well nourished, well developed, in no acute distress  HEENT: normal  Neck: no JVD, carotid bruits, or masses Cardiac: RRR; no murmurs, rubs, or gallops,no edema  Respiratory:  clear to auscultation bilaterally, normal work of breathing GI: soft, nontender, nondistended, + BS MS: no deformity or atrophy  Skin: warm and dry, no rash Neuro:  Alert and Oriented x 3, Strength and sensation are intact Psych: euthymic  mood, full affect  Wt Readings from Last 3 Encounters:  02/11/16 257 lb (116.6 kg)  02/10/16 245 lb (111.1 kg)      Studies/Labs Reviewed:   EKG:  EKG  Normal sinus rhythm, left axis deviation, ischemic T-wave abnormality in II, III, aVF, and V5 through V6. Small Q waves are noted.  Recent Labs: No results found for requested labs within last 8760 hours.   Lipid Panel No results found for: CHOL, TRIG, HDL, CHOLHDL, VLDL, LDLCALC, LDLDIRECT  Additional studies/ records that were reviewed today include:  Reviewed the EKG from Jeffrey [name] from 2000, it was totally normal. The above changes are new.    ASSESSMENT:    1. Abnormal ECG   2. Chest discomfort   3. Hyperlipidemia      PLAN:  In order of problems listed above:  1. Could  represent ischemic injury to the heart. No ongoing discomfort. Definite change in appearance compared to 17 years ago. Ischemia is a concern. 2. The discomfort is only postprandial. It is not exertion related. Under the circumstances however, I am concerned about coronary artery disease. He needs a stress myocardial perfusion study done to exclude ischemia. If abnormal he would need to have coronary angiography. 3. Continue statin therapy. LDL target is 70. 81 mg aspirin daily is recommended.   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 Ejection fraction 43%. No significant areas of ischemia. Because of [name] EF and perfusion abnormality of the study is at least moderate risk.  No chest discomfort since the initial office visit noted above. Abnormality on nuclear perfusion imaging is consistent with the abnormality noted on EKG which suggested inferolateral infarction.  The patient was counseled to undergo left heart catheterization, coronary angiography, and possible percutaneous coronary intervention with stent implantation. The procedural risks and benefits were discussed in detail. The risks discussed included death, stroke, myocardial infarction, life-threatening bleeding, limb ischemia, kidney injury, allergy, and possible emergency cardiac surgery. The risk of these significant complications were estimated to occur less than 1% of the time. After discussion, the patient has agreed to proceed.   Medication Adjustments/Labs and Tests Ordered: Current medicines are reviewed at length with the patient today.  Concerns regarding medicines are outlined above.  Medication changes, Labs and Tests ordered today are listed in the Patient Instructions below. Patient Instructions  Medication Instructions:  Your physician recommends that you continue on your current medications as directed. Please refer to the Current Medication list given to  you today.  Please resume Aspirin 81mg  daily  Labwork: None ordered  Testing/Procedures: Your physician has requested that you have en exercise stress myoview. For further information please visit [email or web address]. Please follow instruction sheet, as given. (Please schedule asap)    Follow-Up: Your physician recommends that you schedule a follow-up appointment as needed pending test results   Any Other Special Instructions Will Be Listed Below (If Applicable).     If you need a refill on your cardiac medications before your next appointment, please call your pharmacy.      Signed, Jeffrey Grooms, MD  02/11/2016 10:28 AM    [address], [place], [place]  [address] Phone: [phone]; Fax: [phone]

## 2016-02-27 ENCOUNTER — Encounter (HOSPITAL_COMMUNITY): Payer: Self-pay | Admitting: Interventional Cardiology

## 2016-02-27 DIAGNOSIS — R9439 Abnormal result of other cardiovascular function study: Secondary | ICD-10-CM | POA: Diagnosis not present

## 2016-02-27 DIAGNOSIS — R079 Chest pain, unspecified: Secondary | ICD-10-CM

## 2016-02-27 DIAGNOSIS — I252 Old myocardial infarction: Secondary | ICD-10-CM | POA: Diagnosis not present

## 2016-02-27 DIAGNOSIS — E785 Hyperlipidemia, unspecified: Secondary | ICD-10-CM | POA: Diagnosis not present

## 2016-02-27 DIAGNOSIS — I10 Essential (primary) hypertension: Secondary | ICD-10-CM

## 2016-02-27 DIAGNOSIS — Z79899 Other long term (current) drug therapy: Secondary | ICD-10-CM | POA: Diagnosis not present

## 2016-02-27 DIAGNOSIS — Z7982 Long term (current) use of aspirin: Secondary | ICD-10-CM | POA: Diagnosis not present

## 2016-02-27 DIAGNOSIS — Z9861 Coronary angioplasty status: Secondary | ICD-10-CM

## 2016-02-27 DIAGNOSIS — I2510 Atherosclerotic heart disease of native coronary artery without angina pectoris: Secondary | ICD-10-CM | POA: Diagnosis not present

## 2016-02-27 LAB — CBC
HCT: 40.6 % (ref 39.0–52.0)
HEMOGLOBIN: 13.1 g/dL (ref 13.0–17.0)
MCH: 31 pg (ref 26.0–34.0)
MCHC: 32.3 g/dL (ref 30.0–36.0)
MCV: 96.2 fL (ref 78.0–100.0)
PLATELETS: 190 10*3/uL (ref 150–400)
RBC: 4.22 MIL/uL (ref 4.22–5.81)
RDW: 13.4 % (ref 11.5–15.5)
WBC: 8.1 10*3/uL (ref 4.0–10.5)

## 2016-02-27 LAB — BASIC METABOLIC PANEL
ANION GAP: 5 (ref 5–15)
BUN: 7 mg/dL (ref 6–20)
CALCIUM: 8.9 mg/dL (ref 8.9–10.3)
CO2: 26 mmol/L (ref 22–32)
CREATININE: 1.3 mg/dL — AB (ref 0.61–1.24)
Chloride: 106 mmol/L (ref 101–111)
GFR, EST NON AFRICAN AMERICAN: 57 mL/min — AB (ref >60)
Glucose, Bld: 97 mg/dL (ref 65–99)
Potassium: 3.6 mmol/L (ref 3.5–5.1)
SODIUM: 137 mmol/L (ref 135–145)

## 2016-02-27 MED ORDER — ACETAMINOPHEN 325 MG PO TABS: 650.0000 mg | ORAL_TABLET | ORAL | Status: DC | PRN

## 2016-02-27 MED ORDER — NITROGLYCERIN 0.4 MG SL SUBL: 0.4000 mg | SUBLINGUAL_TABLET | SUBLINGUAL | Status: DC | PRN

## 2016-02-27 MED ORDER — LISINOPRIL 5 MG PO TABS
5.0000 mg | ORAL_TABLET | Freq: Every day | ORAL | Status: DC
  Administered 2016-02-27: 5 mg via ORAL
  Filled 2016-02-27: qty 1

## 2016-02-27 MED ORDER — NITROGLYCERIN 0.4 MG SL SUBL: 0.4000 mg | SUBLINGUAL_TABLET | SUBLINGUAL | 2 refills | Status: DC | PRN

## 2016-02-27 MED ORDER — PRASUGREL HCL 10 MG PO TABS: 10.0000 mg | ORAL_TABLET | Freq: Every day | ORAL | 3 refills | Status: DC

## 2016-02-27 MED ORDER — PRASUGREL HCL 10 MG PO TABS: 60.0000 mg | ORAL_TABLET | Freq: Once | ORAL | Status: DC

## 2016-02-27 MED ORDER — TICAGRELOR 90 MG PO TABS: 90.0000 mg | ORAL_TABLET | Freq: Two times a day (BID) | ORAL | 3 refills | Status: DC

## 2016-02-27 MED ORDER — ATORVASTATIN CALCIUM 80 MG PO TABS: 80.0000 mg | ORAL_TABLET | Freq: Every day | ORAL | 3 refills | Status: DC

## 2016-02-27 MED ORDER — AMLODIPINE BESYLATE 5 MG PO TABS: 5.0000 mg | ORAL_TABLET | Freq: Every day | ORAL | 3 refills | Status: DC

## 2016-02-27 MED ORDER — LISINOPRIL 5 MG PO TABS: 5.0000 mg | ORAL_TABLET | Freq: Every day | ORAL | 3 refills | Status: DC

## 2016-02-27 MED ORDER — PRASUGREL HCL 10 MG PO TABS: 60.0000 mg | ORAL_TABLET | Freq: Once | ORAL | 0 refills | Status: AC

## 2016-02-27 MED ORDER — PRASUGREL HCL 10 MG PO TABS: 10.0000 mg | ORAL_TABLET | Freq: Every day | ORAL | Status: DC

## 2016-02-27 NOTE — Progress Notes (Signed)
    Brilinta 90mg . twice a day $338.16. For 30 day supply.  No-pre-auth required  Pharmacies are [facility] and [facility]

## 2016-02-27 NOTE — Discharge Instructions (Signed)
Coronary Angiogram With Stent, Care After °Refer to this sheet in the next few weeks. These instructions provide you with information about caring for yourself after your procedure. Your health care provider may also give you more specific instructions. Your treatment has been planned according to current medical practices, but problems sometimes occur. Call your health care provider if you have any problems or questions after your procedure. °WHAT TO EXPECT AFTER THE PROCEDURE  °After your procedure, it is typical to have the following: °· Bruising at the catheter insertion site that usually fades within 1-2 weeks. °· Blood collecting in the tissue (hematoma) that may be painful to the touch. It should usually decrease in size and tenderness within 1-2 weeks. °HOME CARE INSTRUCTIONS °· Take medicines only as directed by your health care provider. Blood thinners may be prescribed after your procedure to improve blood flow through the stent. °· You may shower 24-48 hours after the procedure or as directed by your health care provider. Remove the bandage (dressing) and gently wash the catheter insertion site with plain soap and water. Pat the area dry with a clean towel. Do not rub the site, because this may cause bleeding. °· Do not take baths, swim, or use a hot tub until your health care provider approves. °· Check your catheter insertion site every day for redness, swelling, or drainage. °· Do not apply powder or lotion to the site. °· Do not lift over 10 lb (4.5 kg) for 5 days after your procedure or as directed by your health care provider. °· Ask your health care provider when it is okay to: °¨ Return to work or school. °¨ Resume usual physical activities or sports. °¨ Resume sexual activity. °· Eat a heart-healthy diet. This should include plenty of fresh fruits and vegetables. Meat should be lean cuts. Avoid the following types of food: °¨ Food that is high in salt. °¨ Canned or highly processed food. °¨ Food  that is high in saturated fat or sugar. °¨ Fried food. °· Make any other lifestyle changes as recommended by your health care provider. These may include: °¨ Not using any tobacco products, including cigarettes, chewing tobacco, or electronic cigarettes. If you need help quitting, ask your health care provider. °¨ Managing your weight. °¨ Getting regular exercise. °¨ Managing your blood pressure. °¨ Limiting your alcohol intake. °¨ Managing other health problems, such as diabetes. °· If you need an MRI after your heart stent has been placed, be sure to tell the health care provider who orders the MRI that you have a heart stent. °· Keep all follow-up visits as directed by your health care provider. This is important. °SEEK MEDICAL CARE IF: °· You have a fever. °· You have chills. °· You have increased bleeding from the catheter insertion site. Hold pressure on the site. °SEEK IMMEDIATE MEDICAL CARE IF: °· You develop chest pain or shortness of breath, feel faint, or pass out. °· You have unusual pain at the catheter insertion site. °· You have redness, warmth, or swelling at the catheter insertion site. °· You have drainage (other than a small amount of blood on the dressing) from the catheter insertion site. °· The catheter insertion site is bleeding, and the bleeding does not stop after 30 minutes of holding steady pressure on the site. °· You develop bleeding from any other place, such as from your rectum. There may be bright red blood in your urine or stool, or it may appear as black, tarry stool. °  °  This information is not intended to replace advice given to you by your health care provider. Make sure you discuss any questions you have with your health care provider. °  °Document Released: 11/20/2004 Document Revised: 05/24/2014 Document Reviewed: 09/25/2012 °Elsevier Interactive Patient Education ©2016 Elsevier Inc. ° °

## 2016-02-27 NOTE — Progress Notes (Signed)
DAILY PROGRESS NOTE  Subjective:  No events overnight. No chest pain. Successful PCI with DES to the mid-LCx. Plan for aspirin and Brillinta for 12 months, except he has noted dyspnea with Brillinta, which has improved with caffeine. Blood pressure improved overnight. Bradycardia has been noted.  Objective:  Temp:  [97 F (36.1 C)-98.2 F (36.8 C)] 97 F (36.1 C) (10/13 0429) Pulse Rate:  [0-62] 51 (10/13 0429) Resp:  [8-27] 16 (10/13 0429) BP: (118-155)/(70-96) 131/70 (10/13 0429) SpO2:  [0 %-100 %] 98 % (10/13 0429) Weight:  [255 lb (115.7 kg)-260 lb 5.8 oz (118.1 kg)] 260 lb 5.8 oz (118.1 kg) (10/13 0429) Weight change:   Intake/Output from previous day: 10/12 0701 - 10/13 0700 In: 122.6 [I.V.:122.6] Out: 2600 [Urine:2600]  Intake/Output from this shift: No intake/output data recorded.  Medications: No current facility-administered medications on file prior to encounter.    Current Outpatient Prescriptions on File Prior to Encounter  Medication Sig Dispense Refill  . aspirin 81 MG tablet Take 81 mg by mouth daily.      Marland Kitchen atorvastatin (LIPITOR) 40 MG tablet Take 1 tablet (40 mg total) by mouth daily. 90 tablet 3  . famotidine (PEPCID) 10 MG tablet Take 10 mg by mouth daily as needed for heartburn or indigestion.      Physical Exam: General appearance: alert and no distress Lungs: clear to auscultation bilaterally Heart: regular rate and rhythm, S1, S2 normal, no murmur, click, rub or gallop Extremities: extremities normal, atraumatic, no cyanosis or edema Neurologic: Grossly normal  Lab Results: Results for orders placed or performed during the hospital encounter of 02/26/16 (from the past 48 hour(s))  POCT Activated clotting time     Status: None   Collection Time: 02/26/16 12:43 PM  Result Value Ref Range   Activated Clotting Time 263 seconds  POCT Activated clotting time     Status: None   Collection Time: 02/26/16 12:57 PM  Result Value Ref Range   Activated Clotting Time 351 seconds  CBC     Status: Abnormal   Collection Time: 02/26/16  4:46 PM  Result Value Ref Range   WBC 6.0 4.0 - 10.5 K/uL   RBC 4.18 (L) 4.22 - 5.81 MIL/uL   Hemoglobin 13.5 13.0 - 17.0 g/dL   HCT 40.6 39.0 - 52.0 %   MCV 97.1 78.0 - 100.0 fL   MCH 32.3 26.0 - 34.0 pg   MCHC 33.3 30.0 - 36.0 g/dL   RDW 13.3 11.5 - 15.5 %   Platelets 209 150 - 400 K/uL  Creatinine, serum     Status: None   Collection Time: 02/26/16  4:46 PM  Result Value Ref Range   Creatinine, Ser 1.24 0.61 - 1.24 mg/dL   GFR calc non Af Amer >60 >60 mL/min   GFR calc Af Amer >60 >60 mL/min    Comment: (NOTE) The eGFR has been calculated using the CKD EPI equation. This calculation has not been validated in all clinical situations. eGFR's persistently <60 mL/min signify possible Chronic Kidney Disease.   Basic metabolic panel     Status: Abnormal   Collection Time: 02/27/16  4:14 AM  Result Value Ref Range   Sodium 137 135 - 145 mmol/L   Potassium 3.6 3.5 - 5.1 mmol/L   Chloride 106 101 - 111 mmol/L   CO2 26 22 - 32 mmol/L   Glucose, Bld 97 65 - 99 mg/dL   BUN 7 6 - 20 mg/dL   Creatinine, Ser  1.30 (H) 0.61 - 1.24 mg/dL   Calcium 8.9 8.9 - 10.3 mg/dL   GFR calc non Af Amer 57 (L) >60 mL/min   GFR calc Af Amer >60 >60 mL/min    Comment: (NOTE) The eGFR has been calculated using the CKD EPI equation. This calculation has not been validated in all clinical situations. eGFR's persistently <60 mL/min signify possible Chronic Kidney Disease.    Anion gap 5 5 - 15  CBC     Status: None   Collection Time: 02/27/16  4:14 AM  Result Value Ref Range   WBC 8.1 4.0 - 10.5 K/uL   RBC 4.22 4.22 - 5.81 MIL/uL   Hemoglobin 13.1 13.0 - 17.0 g/dL   HCT 40.6 39.0 - 52.0 %   MCV 96.2 78.0 - 100.0 fL   MCH 31.0 26.0 - 34.0 pg   MCHC 32.3 30.0 - 36.0 g/dL   RDW 13.4 11.5 - 15.5 %   Platelets 190 150 - 400 K/uL    Imaging: No results found.  Assessment:  1. Principal Problem: 2.    Abnormal nuclear stress test 3. Active Problems: 4.   Abnormal ECG 5.   Chest pain with moderate risk of acute coronary syndrome 6.   CAD -S/P PCI 02/26/16 7.   Dyslipidemia 8.   Plan:  1. Jeffrey [name] is doing well today without chest pain. He is dyspneic, but it seems to be related to [place]. Will switch to Effient - reload 60 mg tonight (6 pills), the 10 mg daily thereafter starting tomorrow morning. Add lisinopril 5 mg daily to regimen for cardiac and hypertensive benefit. Continue aspirin, lipitor and amlodipine. [place] for d/c home today. Follow-up with [facility] in 7-10 days and eventually Dr. Tamala Julian.  Time Spent Directly with Patient:  15 minutes  Length of Stay:  LOS: 0 days   Pixie Casino, MD, [facility] Attending Cardiologist [place] 02/27/2016, 8:30 AM

## 2016-02-27 NOTE — Progress Notes (Signed)
Discharge instructions, RX's and follow up appts explained and provided to patient verbalized understanding. Patient left floor via wheelchair accompanied by volunteers. No c/o pain or shortness of breath at discharge.  Elizabella Nolet, Tivis Ringer, RN

## 2016-02-27 NOTE — Progress Notes (Signed)
CARDIAC REHAB PHASE I   PRE:  Rate/Rhythm:71 SR  BP:  Sitting: 161/90        SaO2: 97 RA  MODE:  Ambulation: 800 ft   POST:  Rate/Rhythm: 65 SR  BP:  Sitting: 162/62         SaO2: 98 RA  Pt ambulated 800 ft on RA, handheld assist, steady gait, tolerated well with no complaints. Pt did report some shortness of breath after taking brilinta, MD aware. Completed PCI/stent education.  Reviewed risk factors, anti-platelet therapy, stent card, activity restrictions, ntg, exercise, heart healthy diet, and phase 2 cardiac rehab. Pt verbalized understanding, very receptive to education. Pt agrees to phase 2 cardiac rehab referral, will send to [facility] per pt request. Pt to recliner after walk, call bell within reach.  [phone] Lenna Sciara, RN, BSN  02/27/2016 9:16 AM

## 2016-02-27 NOTE — Care Management Note (Addendum)
Case Management Note  Patient Details  Name: Jeffrey [name] MRN: [id number] Date of Birth: Jun 17, 1955  Subjective/Objective:   NCM spoke with patient gave hime the co pay for Brilinta  338.16, also co pay for effient is 465.83, generic effient prasugrel is 347.57.  [facility] to see if they could still honor the [place] card for patient they said yes.  [facility] they state the card would not let them run it through because of the expiration date and then they said they don't have any in stock.  NCM spoke with patient informed him to go to [facility] to get a weeks worth and then make apt with his cardiologist. NCM also called the [place] care to see if they have any samples but could not get an answer, they may have been at lunch. NCM called patient back, he said he went to [facility] and they have the effient in stock and they need PA to call back in at [phone] for scripts because he had canceled them earlier.  NCM paged [facility].  Awaiting call back. Luke called , NCM gave him the number to call  At [facility] for the effient.     Co-pay amount for effient 10mg . $465.83 no PA required.  Co-pay for Trasugrel 10mg . $347.57  Co-pay for Clopidogrel $3.77                      Action/Plan:   Expected Discharge Date:                  Expected Discharge Plan:  Home/Self Care  In-House Referral:     Discharge planning Services  CM Consult  Post Acute Care Choice:    Choice offered to:     DME Arranged:    DME Agency:     HH Arranged:    HH Agency:     Status of Service:  Completed, signed off  If discussed at [employer] of Stay Meetings, dates discussed:    Additional Comments:  Zenon Mayo, RN 02/27/2016, 1:55 PM

## 2016-02-27 NOTE — Discharge Summary (Addendum)
Patient ID: Jeffrey [name],  MRN: [id number], DOB/AGE: 1955-09-13 60 y.o.  Admit date: 02/26/2016 Discharge date: 02/27/2016  Primary Care Provider: Wenda Low, MD Primary Cardiologist: Dr Tamala Julian  Discharge Diagnoses Principal Problem:   Abnormal nuclear stress test Active Problems:   Abnormal ECG   Chest pain with moderate risk of acute coronary syndrome   CAD -S/P PCI 02/26/16   Dyslipidemia    Procedures: Coronary angiogram and CFX PCI/ DES 02/26/16   Hospital Course:  60 year old who is an avid golfer. Over the past 3 months is noted recurring postprandial midsternal discomfort. One episode lasted nearly 3 hours. He spoke to his physician, Dr. Deforest Hoyles about the discomfort. an EKG was done and was different than his prior tracing 17 years previous. The pt saw Dr Tamala Julian in the office 02/11/16 and was set up for a Myoview. This was done 02/19/16 and demonstrated a large region of inferolateral infarction. Ejection fraction 43%. No significant areas of ischemia. Because of low EF and perfusion abnormality of the study is at least moderate risk. He was admitted for diagnostic cath 02/26/16. This revealed a 99% CFX which was treated with a DES> He had residual 85% OM1, 50% LAD, 50% RCA and normal LVF at cath. He was seen the morning of 10/13 by Dr Debara Pickett. The py complained of dyspnea which he felt was secondary to [place]. This was changed to Effient, starting with a 60 mg loading dose on the PM of discharge.    Discharge Vitals:  Blood pressure (!) 161/90, pulse 70, temperature 97 F (36.1 C), temperature source Oral, resp. rate 14, height 5\' 11"  (1.803 m), weight 260 lb 5.8 oz (118.1 kg), SpO2 98 %.    Labs: Results for orders placed or performed during the hospital encounter of 02/26/16 (from the past 24 hour(s))  POCT Activated clotting time     Status: None   Collection Time: 02/26/16 12:43 PM  Result Value Ref Range   Activated Clotting Time 263 seconds  POCT Activated  clotting time     Status: None   Collection Time: 02/26/16 12:57 PM  Result Value Ref Range   Activated Clotting Time 351 seconds  CBC     Status: Abnormal   Collection Time: 02/26/16  4:46 PM  Result Value Ref Range   WBC 6.0 4.0 - 10.5 K/uL   RBC 4.18 (L) 4.22 - 5.81 MIL/uL   Hemoglobin 13.5 13.0 - 17.0 g/dL   HCT 40.6 39.0 - 52.0 %   MCV 97.1 78.0 - 100.0 fL   MCH 32.3 26.0 - 34.0 pg   MCHC 33.3 30.0 - 36.0 g/dL   RDW 13.3 11.5 - 15.5 %   Platelets 209 150 - 400 K/uL  Creatinine, serum     Status: None   Collection Time: 02/26/16  4:46 PM  Result Value Ref Range   Creatinine, Ser 1.24 0.61 - 1.24 mg/dL   GFR calc non Af Amer >60 >60 mL/min   GFR calc Af Amer >60 >60 mL/min  Basic metabolic panel     Status: Abnormal   Collection Time: 02/27/16  4:14 AM  Result Value Ref Range   Sodium 137 135 - 145 mmol/L   Potassium 3.6 3.5 - 5.1 mmol/L   Chloride 106 101 - 111 mmol/L   CO2 26 22 - 32 mmol/L   Glucose, Bld 97 65 - 99 mg/dL   BUN 7 6 - 20 mg/dL   Creatinine, Ser 1.30 (H) 0.61 - 1.24 mg/dL  Calcium 8.9 8.9 - 10.3 mg/dL   GFR calc non Af Amer 57 (L) >60 mL/min   GFR calc Af Amer >60 >60 mL/min   Anion gap 5 5 - 15  CBC     Status: None   Collection Time: 02/27/16  4:14 AM  Result Value Ref Range   WBC 8.1 4.0 - 10.5 K/uL   RBC 4.22 4.22 - 5.81 MIL/uL   Hemoglobin 13.1 13.0 - 17.0 g/dL   HCT 40.6 39.0 - 52.0 %   MCV 96.2 78.0 - 100.0 fL   MCH 31.0 26.0 - 34.0 pg   MCHC 32.3 30.0 - 36.0 g/dL   RDW 13.4 11.5 - 15.5 %   Platelets 190 150 - 400 K/uL    Disposition:  Follow-up Information    Sinclair Grooms, MD .   Specialty:  Cardiology Why:  office will contact you Contact information: [address] [place] [address] [phone]           Discharge Medications:    Medication List    STOP taking these medications   sildenafil 25 MG tablet Commonly known as:  VIAGRA     TAKE these medications   acetaminophen 325 MG  tablet Commonly known as:  TYLENOL Take 2 tablets (650 mg total) by mouth every 4 (four) hours as needed for headache or mild pain.   amLODipine 5 MG tablet Commonly known as:  NORVASC Take 1 tablet (5 mg total) by mouth daily.   aspirin 81 MG tablet Take 81 mg by mouth daily.   atorvastatin 80 MG tablet Commonly known as:  LIPITOR Take 1 tablet (80 mg total) by mouth daily at 6 PM. What changed:  medication strength  how much to take  when to take this   famotidine 10 MG tablet Commonly known as:  PEPCID Take 10 mg by mouth daily as needed for heartburn or indigestion.   lisinopril 5 MG tablet Commonly known as:  PRINIVIL,ZESTRIL Take 1 tablet (5 mg total) by mouth daily.   multivitamin with minerals Tabs tablet Take 1 tablet by mouth daily.   nitroGLYCERIN 0.4 MG SL tablet Commonly known as:  NITROSTAT Place 1 tablet (0.4 mg total) under the tongue every 5 (five) minutes as needed for chest pain.   prasugrel 10 MG Tabs tablet Commonly known as:  EFFIENT Take 6 tablets (60 mg total) by mouth once.   prasugrel 10 MG Tabs tablet Commonly known as:  EFFIENT Take 1 tablet (10 mg total) by mouth daily. Start taking on:  02/28/2016        Duration of Discharge Encounter: Greater than 30 minutes including physician time.  Signed, Kerin Ransom PA-C 02/27/2016 9:35 AM    Note; Pt initially was to go home on Brilinta but had dyspnea. This was changed to Effient but when he found ou the cost would be hundreds of dollars out of pocket he decided to go with Brilinta. In the meantime the Care manager arranged for the pt to get [facility] but when the [facility] got there they turned him away saying they didn't even have Effient in stock. I spoke with the pharmacist at [facility]. The will supply pt with loading dose of Effient and a 30 day Rx. We will need to discuss possibly changing to Plavix after 30 days  With Dr Tamala Julian.  Kerin Ransom  PA-C 02/27/2016 2:39 PM

## 2016-03-16 ENCOUNTER — Encounter: Payer: BLUE CROSS/BLUE SHIELD | Admitting: Cardiology

## 2016-03-18 ENCOUNTER — Ambulatory Visit (INDEPENDENT_AMBULATORY_CARE_PROVIDER_SITE_OTHER): Payer: BLUE CROSS/BLUE SHIELD | Admitting: Cardiology

## 2016-03-18 ENCOUNTER — Encounter: Payer: Self-pay | Admitting: Cardiology

## 2016-03-18 ENCOUNTER — Encounter (INDEPENDENT_AMBULATORY_CARE_PROVIDER_SITE_OTHER): Payer: Self-pay

## 2016-03-18 VITALS — BP 120/70 | HR 65 | Ht 70.5 in | Wt 247.0 lb

## 2016-03-18 DIAGNOSIS — N529 Male erectile dysfunction, unspecified: Secondary | ICD-10-CM

## 2016-03-18 DIAGNOSIS — R9431 Abnormal electrocardiogram [ECG] [EKG]: Secondary | ICD-10-CM | POA: Diagnosis not present

## 2016-03-18 DIAGNOSIS — R0789 Other chest pain: Secondary | ICD-10-CM | POA: Diagnosis not present

## 2016-03-18 DIAGNOSIS — I2510 Atherosclerotic heart disease of native coronary artery without angina pectoris: Secondary | ICD-10-CM

## 2016-03-18 DIAGNOSIS — Z09 Encounter for follow-up examination after completed treatment for conditions other than malignant neoplasm: Secondary | ICD-10-CM

## 2016-03-18 DIAGNOSIS — E785 Hyperlipidemia, unspecified: Secondary | ICD-10-CM

## 2016-03-18 DIAGNOSIS — Z9861 Coronary angioplasty status: Secondary | ICD-10-CM

## 2016-03-18 MED ORDER — PRASUGREL HCL 10 MG PO TABS: 10.0000 mg | ORAL_TABLET | Freq: Every day | ORAL | 0 refills | Status: DC

## 2016-03-18 NOTE — Progress Notes (Signed)
Cardiology Office Note   Date:  03/18/2016   ID:  Jeffrey [name], DOB [birth year], MRN [id number]  PCP:  Wenda Low, MD  Cardiologist:  Dr. Tamala Julian    Chief Complaint  Patient presents with  . Follow-up  . Shortness of Breath    just periodically      History of Present Illness: Jeffrey [name] is a 60 y.o. male who presents for post hospitalization after admit for abnormal stress test and undergoing PCI 02/26/16 to LCX.  He had residual 85% OM1, 50% LAD, 50% RCA and normal LVF at cath.  He is now on ACE.  He feels great and no further chest pain or SOB.  He is walking on treadmill and would like to attend cardiac rehab. He no longer smokes his cigar. He has stopped eating fried foods.. No problems with the Effient.  He did not tolerate Brilinta. VS are good today.  Taking his meds consistently.  Not on BB due to bradycardia/  Pt is requesting to use Viagra.           Past Medical History:  Diagnosis Date  . Chest wall abscess 2012   "lanced it & it's gone"  . Elevated cholesterol   . GERD (gastroesophageal reflux disease)     Past Surgical History:  Procedure Laterality Date  . CARDIAC CATHETERIZATION N/A 02/26/2016   Procedure: Left Heart Cath and Coronary Angiography;  Surgeon: Belva Crome, MD;  Location: [facility];  Service: Cardiovascular;  Laterality: N/A;  . CARDIAC CATHETERIZATION N/A 02/26/2016   Procedure: Coronary Stent Intervention;  Surgeon: Belva Crome, MD;  Location: [facility];  Service: Cardiovascular;  Laterality: N/A;  . COLONOSCOPY  2007  . COLONOSCOPY WITH PROPOFOL N/A 02/10/2016   Procedure: COLONOSCOPY WITH PROPOFOL;  Surgeon: Garlan Fair, MD;  [facility];  Service: Endoscopy;  Laterality: N/A;  . CORONARY ANGIOPLASTY WITH STENT PLACEMENT  02/26/2016     Current Outpatient Prescriptions  Medication Sig Dispense Refill  . amLODipine (NORVASC) 5 MG tablet Take 1 tablet (5 mg total) by mouth daily. 90 tablet 3  .  aspirin 81 MG tablet Take 81 mg by mouth daily.      Marland Kitchen atorvastatin (LIPITOR) 80 MG tablet Take 1 tablet (80 mg total) by mouth daily at 6 PM. 90 tablet 3  . lisinopril (PRINIVIL,ZESTRIL) 5 MG tablet Take 1 tablet (5 mg total) by mouth daily. 90 tablet 3  . Multiple Vitamin (MULTIVITAMIN WITH MINERALS) TABS tablet Take 1 tablet by mouth daily.    . nitroGLYCERIN (NITROSTAT) 0.4 MG SL tablet Place 1 tablet (0.4 mg total) under the tongue every 5 (five) minutes as needed for chest pain. 25 tablet 2  . prasugrel (EFFIENT) 10 MG TABS tablet Take 1 tablet (10 mg total) by mouth daily. 90 tablet 3   No current facility-administered medications for this visit.     Allergies:   Review of patient's allergies indicates no known allergies.    Social History:  The patient  reports that he has been smoking Cigars.  He has never used smokeless tobacco. He reports that he drinks about 10.8 oz of alcohol per week . He reports that he does not use drugs.   Family History:  The patient's family history includes Hypertension in his sister; Stroke in his brother.    ROS:  General:no colds or fevers, + weight loss Skin:no rashes or ulcers HEENT:no blurred vision, no congestion CV:see HPI PUL:see HPI GI:no diarrhea  constipation or melena, no indigestion GU:no hematuria, no dysuria MS:no joint pain, no claudication Neuro:no syncope, no lightheadedness Endo:no diabetes, no thyroid disease  Wt Readings from Last 3 Encounters:  03/18/16 247 lb (112 kg)  02/27/16 260 lb 5.8 oz (118.1 kg)  02/19/16 257 lb (116.6 kg)     PHYSICAL EXAM: VS:  BP 120/70   Pulse 65   Ht 5' 10.5" (1.791 m)   Wt 247 lb (112 kg)   SpO2 97%   BMI 34.94 kg/m  , BMI Body mass index is 34.94 kg/m. General:Pleasant affect, NAD Skin:Warm and dry, brisk capillary refill HEENT:normocephalic, sclera clear, mucus membranes moist Neck:supple, no JVD, no bruits  Heart:S1S2 RRR without murmur, gallup, rub or click Lungs:clear  without rales, rhonchi, or wheezes [id number], non tender, + BS, do not palpate liver spleen or masses Ext:no lower ext edema, 2+ pedal pulses, 2+ radial pulses, cath site without hematoma. Neuro:alert and oriented, MAE, follows commands, + facial symmetry    EKG:  EKG is NOT ordered today.    Recent Labs: 02/27/2016: BUN 7; Creatinine, Ser 1.30; Hemoglobin 13.1; Platelets 190; Potassium 3.6; Sodium 137    Lipid Panel No results found for: CHOL, TRIG, HDL, CHOLHDL, VLDL, LDLCALC, LDLDIRECT     Other studies Reviewed: Additional studies/ records that were reviewed today include: . Cardiac cath   Prox RCA lesion, 50 %stenosed.  Mid RCA to Dist RCA lesion, 30 %stenosed.  1st Mrg lesion, 65 %stenosed.  Prox LAD to Dist LAD lesion, 30 %stenosed.  Dist LAD lesion, 60 %stenosed.  Ost LAD to Prox LAD lesion, 50 %stenosed.  The left ventricular systolic function is normal.  A STENT SYNERGY DES 3.5X24 drug eluting stent was successfully placed, and does not overlap previously placed stent.  Mid Cx to Dist Cx lesion, 99 %stenosed.  Post intervention, there is a 15% residual stenosis.  Ost 1st Mrg to 1st Mrg lesion, 85 %stenosed.    Moderate to moderately severe diffuse coronary artery disease involving the entire right coronary artery, and the left anterior descending with blockages up to 50% in multiple areas.  Severe involvement of the circumflex artery with 99% mid vessel stenosis, thrombus, and TIMI grade 2 flow. The first obtuse marginal contains ostial 70% stenosis.  Inferobasal hypokinesis. EF 55%.  Successful PCI with angioplasty and stenting of the mid circumflex with a 24 x 3.5 mm Synergy drug-eluting stent postdilated to 3.75 mm in diameter. Less than 20% stenosis was noted throughout the stent.   The proximal margin of the stent slightly overlap the ostium of the first obtuse marginal had 85% stenosis was noted post stent deployment. This branch was not  treated.  CONCLUSIONS:   Aspirin and Brilinta 12 months  Aggressive risk factor modification  Aggressive blood pressure control to include low-dose beta blocker therapy if heart rate allows.  ASSESSMENT AND PLAN:  1.  CAD with recent PCI to LCX and residual disease.  Continue Effient for 12 months.continue ASA.  encoraged cardiac rehab.  Follow up with Dr. Tamala Julian 4-5 months.  2. HTN controlled  3. hyperlipidemia continue high dose statin, recheck CMP and Lipid in 6 weeks.   4. Erectile dysfunction - will check with Dr. Tamala Julian to resume prn Viagra we discussed not taking Viagra and NTG within 72 hours of each other or syncope or worse may occur, pt understands.        Current medicines are reviewed with the patient today.  The patient Has no concerns regarding medicines.  The following changes have been made:  See above Labs/ tests ordered today include:see above  Disposition:   FU:  see above  Signed, Cecilie Kicks, NP  03/18/2016 1:30 PM    [facility] [place], [place], [place], [place] Phone: [phone]; Fax: [phone]

## 2016-03-18 NOTE — Patient Instructions (Addendum)
Medication Instructions:  Your physician recommends that you continue on your current medications as directed. Please refer to the Current Medication list given to you today.  Labwork: 6 WEEKS:  CMET & HEPATIC   Testing/Procedures: None ordered   Follow-Up: Your physician wants you to follow-up in: 4 MONTHS WITH DR. Gaspar Bidding will receive a reminder letter in the mail two months in advance. If you don't receive a letter, please call our office to schedule the follow-up appointment.    Any Other Special Instructions Will Be Listed Below (If Applicable).     If you need a refill on your cardiac medications before your next appointment, please call your pharmacy.

## 2016-03-19 ENCOUNTER — Telehealth: Payer: Self-pay | Admitting: *Deleted

## 2016-03-19 NOTE — Telephone Encounter (Signed)
Called pt to let him that Dr. Tamala Julian was ok for him to use Viagra as long as pt knows he cannot use within 72 hours of NTG, or vise versa.  Pt verbalized appreciation and understanding.

## 2016-04-29 ENCOUNTER — Other Ambulatory Visit: Payer: BLUE CROSS/BLUE SHIELD | Admitting: *Deleted

## 2016-04-29 DIAGNOSIS — Z9861 Coronary angioplasty status: Secondary | ICD-10-CM

## 2016-04-29 DIAGNOSIS — I2510 Atherosclerotic heart disease of native coronary artery without angina pectoris: Secondary | ICD-10-CM

## 2016-04-29 DIAGNOSIS — E785 Hyperlipidemia, unspecified: Secondary | ICD-10-CM | POA: Diagnosis not present

## 2016-04-29 LAB — COMPREHENSIVE METABOLIC PANEL
ALK PHOS: 56 U/L (ref 40–115)
ALT: 32 U/L (ref 9–46)
AST: 28 U/L (ref 10–35)
Albumin: 3.9 g/dL (ref 3.6–5.1)
BUN: 12 mg/dL (ref 7–25)
CALCIUM: 9.5 mg/dL (ref 8.6–10.3)
CO2: 27 mmol/L (ref 20–31)
Chloride: 104 mmol/L (ref 98–110)
Creat: 1.25 mg/dL (ref 0.70–1.25)
Glucose, Bld: 94 mg/dL (ref 65–99)
POTASSIUM: 3.8 mmol/L (ref 3.5–5.3)
Sodium: 138 mmol/L (ref 135–146)
TOTAL PROTEIN: 7 g/dL (ref 6.1–8.1)
Total Bilirubin: 1 mg/dL (ref 0.2–1.2)

## 2016-04-29 LAB — HEPATIC FUNCTION PANEL
ALK PHOS: 56 U/L (ref 40–115)
ALT: 32 U/L (ref 9–46)
AST: 28 U/L (ref 10–35)
Albumin: 3.9 g/dL (ref 3.6–5.1)
BILIRUBIN DIRECT: 0.2 mg/dL (ref <=0.2)
BILIRUBIN INDIRECT: 0.8 mg/dL (ref 0.2–1.2)
BILIRUBIN TOTAL: 1 mg/dL (ref 0.2–1.2)
Total Protein: 7 g/dL (ref 6.1–8.1)

## 2016-06-01 DIAGNOSIS — N529 Male erectile dysfunction, unspecified: Secondary | ICD-10-CM | POA: Diagnosis not present

## 2016-06-01 DIAGNOSIS — I10 Essential (primary) hypertension: Secondary | ICD-10-CM | POA: Diagnosis not present

## 2016-06-01 DIAGNOSIS — I2510 Atherosclerotic heart disease of native coronary artery without angina pectoris: Secondary | ICD-10-CM | POA: Diagnosis not present

## 2016-06-01 DIAGNOSIS — E7800 Pure hypercholesterolemia, unspecified: Secondary | ICD-10-CM | POA: Diagnosis not present

## 2016-06-23 ENCOUNTER — Other Ambulatory Visit: Payer: Self-pay | Admitting: Interventional Cardiology

## 2016-06-23 MED ORDER — PRASUGREL HCL 10 MG PO TABS: 10.0000 mg | ORAL_TABLET | Freq: Every day | ORAL | 2 refills | Status: DC

## 2016-07-06 ENCOUNTER — Encounter (HOSPITAL_COMMUNITY): Payer: Self-pay | Admitting: Internal Medicine

## 2016-07-06 NOTE — Progress Notes (Signed)
Mailed patient letter with information about Cardiac Rehab program. MW °

## 2016-07-08 ENCOUNTER — Encounter: Payer: Self-pay | Admitting: Interventional Cardiology

## 2016-07-18 DIAGNOSIS — I10 Essential (primary) hypertension: Secondary | ICD-10-CM | POA: Insufficient documentation

## 2016-07-18 NOTE — Progress Notes (Signed)
Cardiology Office Note    Date:  07/19/2016   ID:  Jeffrey [name], DOB 1955-09-29, MRN [id number]  PCP:  Wenda Low, MD  Cardiologist: Sinclair Grooms, MD   Chief Complaint  Patient presents with  . Coronary Artery Disease    History of Present Illness:  Daymon Buchholtz is a 61 y.o. male who presents for follow-up after undergoing PCI 02/26/16 to LCX.  He has residual 85% OM1, 50% LAD, 50% RCA and normal LVF at cath. Since stent / NSTEMI, he has been asymptomatic.   Feels well without cardiac concerns. Has changed his diet. Trying to lose weight. Maintaining a decent level of aerobic physical activity. No episodes of syncope. No medication side effects.   Past Medical History:  Diagnosis Date  . Chest wall abscess 2012   "lanced it & it's gone"  . Elevated cholesterol   . GERD (gastroesophageal reflux disease)     Past Surgical History:  Procedure Laterality Date  . CARDIAC CATHETERIZATION N/A 02/26/2016   Procedure: Left Heart Cath and Coronary Angiography;  Surgeon: Belva Crome, MD;  Location: [facility];  Service: Cardiovascular;  Laterality: N/A;  . CARDIAC CATHETERIZATION N/A 02/26/2016   Procedure: Coronary Stent Intervention;  Surgeon: Belva Crome, MD;  Location: [facility];  Service: Cardiovascular;  Laterality: N/A;  . COLONOSCOPY  2007  . COLONOSCOPY WITH PROPOFOL N/A 02/10/2016   Procedure: COLONOSCOPY WITH PROPOFOL;  Surgeon: Garlan Fair, MD;  [facility];  Service: Endoscopy;  Laterality: N/A;  . CORONARY ANGIOPLASTY WITH STENT PLACEMENT  02/26/2016    Current Medications: Outpatient Medications Prior to Visit  Medication Sig Dispense Refill  . amLODipine (NORVASC) 5 MG tablet Take 1 tablet (5 mg total) by mouth daily. 90 tablet 3  . aspirin 81 MG tablet Take 81 mg by mouth daily.      Marland Kitchen atorvastatin (LIPITOR) 80 MG tablet Take 1 tablet (80 mg total) by mouth daily at 6 PM. 90 tablet 3  . lisinopril (PRINIVIL,ZESTRIL) 5 MG  tablet Take 1 tablet (5 mg total) by mouth daily. 90 tablet 3  . Multiple Vitamin (MULTIVITAMIN WITH MINERALS) TABS tablet Take 1 tablet by mouth daily.    . nitroGLYCERIN (NITROSTAT) 0.4 MG SL tablet Place 1 tablet (0.4 mg total) under the tongue every 5 (five) minutes as needed for chest pain. 25 tablet 2  . prasugrel (EFFIENT) 10 MG TABS tablet Take 1 tablet (10 mg total) by mouth daily. 90 tablet 2   No facility-administered medications prior to visit.      Allergies:   Brilinta [ticagrelor]   Social History   Social History  . Marital status: Married    Spouse name: N/A  . Number of children: N/A  . Years of education: N/A   Social History Main Topics  . Smoking status: Former Smoker    Types: Cigars    Quit date: 01/16/2016  . Smokeless tobacco: Never Used     Comment: 02/26/2016 "might smoke 1/month"  . Alcohol use 10.8 oz/week    18 Cans of beer per week  . Drug use: No  . Sexual activity: Not Asked   Other Topics Concern  . None   Social History Narrative  . None     Family History:  The patient's family history includes Hypertension in his sister; Stroke in his brother.   ROS:   Please see the history of present illness.    Has upcoming oral surgery and  will need guidance.  All other systems reviewed and are negative.   PHYSICAL EXAM:   VS:  BP 130/74 (BP Location: Left Arm)   Pulse (!) 55   Ht 5' 10.5" (1.791 m)   Wt 240 lb 6.4 oz (109 kg)   BMI 34.01 kg/m    GEN: Well nourished, well developed, in no acute distress  HEENT: normal  Neck: no JVD, carotid bruits, or masses Cardiac: RRR; no murmurs, rubs, or gallops,no edema  Respiratory:  clear to auscultation bilaterally, normal work of breathing GI: soft, nontender, nondistended, + BS MS: no deformity or atrophy  Skin: warm and dry, no rash. Neuro:  Alert and Oriented x 3, Strength and sensation are intact. Psych: euthymic mood, full affect.  Wt Readings from Last 3 Encounters:  07/19/16 240 lb  6.4 oz (109 kg)  03/18/16 247 lb (112 kg)  02/27/16 260 lb 5.8 oz (118.1 kg)      Studies/Labs Reviewed:   EKG:  EKG  No new study.  Recent Labs: 02/27/2016: Hemoglobin 13.1; Platelets 190 04/29/2016: ALT 32; ALT 32; BUN 12; Creat 1.25; Potassium 3.8; Sodium 138   Lipid Panel No results found for: CHOL, TRIG, HDL, CHOLHDL, VLDL, LDLCALC, LDLDIRECT  Additional studies/ records that were reviewed today include:   Cardiac Cath 02/26/2016  Diagnostic Diagram     Post-Intervention Diagram        ASSESSMENT:    1. CAD -S/P PCI 02/26/16   2. Dyslipidemia   3. Essential hypertension      PLAN:  In order of problems listed above:  1. Personally reviewed cath findings, treatment and residual disease with the patient.switch to Plavix 6 months after the procedure and discontinue Effient. 2. No recent lipid panel. Needs to have lipid panel soon. Recent liver panel without lipid. Target LDL < 70 mg/dl. 3. Well controlled BP. Goal/target 140/90 mmHg or less.  Plan aerobic activity, weight loss, guideline based therapy to achieve targets, and continuation of dual antiplatelet therapy. Plan to switch over to Plavix 6 months after procedure.should not have the dental procedure until at least 6 months out and preferably 12 months out. Clinical follow-up in 4-6 months.  Medication Adjustments/Labs and Tests Ordered: Current medicines are reviewed at length with the patient today.  Concerns regarding medicines are outlined above.  Medication changes, Labs and Tests ordered today are listed in the Patient Instructions below. Patient Instructions  Medication Instructions:  1) DISCONTINUE Effient once you use up your remaining supply. 2) After you complete Effient, START Plavix 75mg  once daily  Labwork: None  Testing/Procedures: None  Follow-Up: Your physician recommends that you schedule a follow-up appointment in: 3-4 months with Dr. Tamala Julian.    Any Other Special Instructions  Will Be Listed Below (If Applicable).     If you need a refill on your cardiac medications before your next appointment, please call your pharmacy.      Signed, Sinclair Grooms, MD  07/19/2016 12:07 PM    [facility] [place], [place], [place]  [address] Phone: [phone]; Fax: [phone]

## 2016-07-19 ENCOUNTER — Encounter: Payer: Self-pay | Admitting: Interventional Cardiology

## 2016-07-19 ENCOUNTER — Ambulatory Visit (INDEPENDENT_AMBULATORY_CARE_PROVIDER_SITE_OTHER): Payer: BLUE CROSS/BLUE SHIELD | Admitting: Interventional Cardiology

## 2016-07-19 ENCOUNTER — Encounter (INDEPENDENT_AMBULATORY_CARE_PROVIDER_SITE_OTHER): Payer: Self-pay

## 2016-07-19 VITALS — BP 130/74 | HR 55 | Ht 70.5 in | Wt 240.4 lb

## 2016-07-19 DIAGNOSIS — E785 Hyperlipidemia, unspecified: Secondary | ICD-10-CM | POA: Diagnosis not present

## 2016-07-19 DIAGNOSIS — I2510 Atherosclerotic heart disease of native coronary artery without angina pectoris: Secondary | ICD-10-CM

## 2016-07-19 DIAGNOSIS — Z9861 Coronary angioplasty status: Secondary | ICD-10-CM | POA: Diagnosis not present

## 2016-07-19 DIAGNOSIS — I10 Essential (primary) hypertension: Secondary | ICD-10-CM

## 2016-07-19 MED ORDER — NITROGLYCERIN 0.4 MG SL SUBL: 0.4000 mg | SUBLINGUAL_TABLET | SUBLINGUAL | 2 refills | Status: DC | PRN

## 2016-07-19 MED ORDER — CLOPIDOGREL BISULFATE 75 MG PO TABS: 75.0000 mg | ORAL_TABLET | Freq: Every day | ORAL | 3 refills | Status: DC

## 2016-07-19 NOTE — Addendum Note (Signed)
Addended by: Loren Racer on: 07/19/2016 05:03 PM   Modules accepted: Orders

## 2016-07-19 NOTE — Patient Instructions (Signed)
Medication Instructions:  1) DISCONTINUE Effient once you use up your remaining supply. 2) After you complete Effient, START Plavix 75mg  once daily  Labwork: None  Testing/Procedures: None  Follow-Up: Your physician recommends that you schedule a follow-up appointment in: 3-4 months with Dr. Tamala Julian.    Any Other Special Instructions Will Be Listed Below (If Applicable).     If you need a refill on your cardiac medications before your next appointment, please call your pharmacy.

## 2016-07-20 ENCOUNTER — Other Ambulatory Visit (INDEPENDENT_AMBULATORY_CARE_PROVIDER_SITE_OTHER): Payer: BLUE CROSS/BLUE SHIELD

## 2016-07-20 DIAGNOSIS — Z9861 Coronary angioplasty status: Secondary | ICD-10-CM | POA: Diagnosis not present

## 2016-07-20 DIAGNOSIS — I2510 Atherosclerotic heart disease of native coronary artery without angina pectoris: Secondary | ICD-10-CM

## 2016-07-20 LAB — LIPID PANEL
CHOLESTEROL TOTAL: 109 mg/dL (ref 100–199)
Chol/HDL Ratio: 2.7 ratio units (ref 0.0–5.0)
HDL: 41 mg/dL (ref >39)
LDL CALC: 59 mg/dL (ref 0–99)
Triglycerides: 47 mg/dL (ref 0–149)
VLDL Cholesterol Cal: 9 mg/dL (ref 5–40)

## 2016-07-21 ENCOUNTER — Telehealth: Payer: Self-pay | Admitting: Interventional Cardiology

## 2016-07-21 NOTE — Telephone Encounter (Signed)
Pt returning call thinks it is for his lab results

## 2016-07-21 NOTE — Telephone Encounter (Signed)
Informed pt of lab results. Pt verbalized understanding. 

## 2016-10-13 ENCOUNTER — Telehealth: Payer: Self-pay | Admitting: Interventional Cardiology

## 2016-10-13 NOTE — Telephone Encounter (Signed)
New message    1. What dental office are you calling from? [facility]   2. What is your office phone and fax number? [phone]  3. What type of procedure is the patient having performed? Extraction with bone replacement  4. What date is procedure scheduled? July  5. What is your question (ex. Antibiotics prior to procedure, holding medication-we need to know how long dentist wants pt to hold med)? They need to find out how long blood thinner should be held.

## 2016-10-13 NOTE — Telephone Encounter (Signed)
Will route to Dr. Tamala Julian for review and advisement.  Pt on Plavix. S/P stent 02/26/16.

## 2016-10-14 NOTE — Telephone Encounter (Signed)
Faxed to requesting office. 

## 2016-10-14 NOTE — Telephone Encounter (Signed)
Reviewed cath images. Okay to proceed. Hold Plavix 5-7 weeks. Resume as soon as possible after procedure.

## 2016-11-09 NOTE — Progress Notes (Signed)
Cardiology Office Note    Date:  11/11/2016   ID:  Jeffrey [name], DOB 05-22-1955, MRN [id number]  PCP:  Wenda Low, MD  Cardiologist: Sinclair Grooms, MD   Chief Complaint  Patient presents with  . Coronary Artery Disease    History of Present Illness:  Jeffrey [name] is a 61 y.o. male who presents for follow-up after undergoing PCI 02/26/16 to LCX. He hasresidual 85% OM1, 50% LAD, 50% RCA and normal LVF at cath. Since stent / NSTEMI, he has been asymptomatic.  Echo is doing well. He has had no recurrence of angina. He underwent PCI with subtotally occluded circumflex in October 2017 after having several days of "indigestion". Residual disease as noted above. In absence of angina we will continue aggressive risk factor modification aiming especially at the lipid panel. We will LDL cholesterol less than 70. He is tolerating his medications without side effects.  Past Medical History:  Diagnosis Date  . Chest wall abscess 2012   "lanced it & it's gone"  . Elevated cholesterol   . GERD (gastroesophageal reflux disease)     Past Surgical History:  Procedure Laterality Date  . CARDIAC CATHETERIZATION N/A 02/26/2016   Procedure: Left Heart Cath and Coronary Angiography;  Surgeon: Belva Crome, MD;  Location: [facility];  Service: Cardiovascular;  Laterality: N/A;  . CARDIAC CATHETERIZATION N/A 02/26/2016   Procedure: Coronary Stent Intervention;  Surgeon: Belva Crome, MD;  Location: [facility];  Service: Cardiovascular;  Laterality: N/A;  . COLONOSCOPY  2007  . COLONOSCOPY WITH PROPOFOL N/A 02/10/2016   Procedure: COLONOSCOPY WITH PROPOFOL;  Surgeon: Garlan Fair, MD;  [facility];  Service: Endoscopy;  Laterality: N/A;  . CORONARY ANGIOPLASTY WITH STENT PLACEMENT  02/26/2016    Current Medications: Outpatient Medications Prior to Visit  Medication Sig Dispense Refill  . amLODipine (NORVASC) 5 MG tablet Take 1 tablet (5 mg total) by mouth  daily. 90 tablet 3  . aspirin 81 MG tablet Take 81 mg by mouth daily.      Marland Kitchen atorvastatin (LIPITOR) 80 MG tablet Take 1 tablet (80 mg total) by mouth daily at 6 PM. 90 tablet 3  . clopidogrel (PLAVIX) 75 MG tablet Take 1 tablet (75 mg total) by mouth daily. 90 tablet 3  . Multiple Vitamin (MULTIVITAMIN WITH MINERALS) TABS tablet Take 1 tablet by mouth daily.    . nitroGLYCERIN (NITROSTAT) 0.4 MG SL tablet Place 1 tablet (0.4 mg total) under the tongue every 5 (five) minutes as needed for chest pain. 25 tablet 2  . lisinopril (PRINIVIL,ZESTRIL) 5 MG tablet Take 1 tablet (5 mg total) by mouth daily. 90 tablet 3   No facility-administered medications prior to visit.      Allergies:   Brilinta [ticagrelor]   Social History   Social History  . Marital status: Married    Spouse name: N/A  . Number of children: N/A  . Years of education: N/A   Social History Main Topics  . Smoking status: Former Smoker    Types: Cigars    Quit date: 01/16/2016  . Smokeless tobacco: Never Used     Comment: 02/26/2016 "might smoke 1/month"  . Alcohol use 10.8 oz/week    18 Cans of beer per week  . Drug use: No  . Sexual activity: Not Asked   Other Topics Concern  . None   Social History Narrative  . None     Family History:  The patient's  family history includes Hypertension in his sister; Stroke in his brother.   ROS:   Please see the history of present illness.    There are no side effects. There are no chronic medical complaints.  All other systems reviewed and are negative.   PHYSICAL EXAM:   VS:  BP 132/76 (BP Location: Left Arm)   Pulse (!) 101   Ht 5\' 11"  (1.803 m)   Wt 239 lb 6.4 oz (108.6 kg)   BMI 33.39 kg/m    GEN: Well nourished, well developed, in no acute distress  HEENT: normal  Neck: no JVD, carotid bruits, or masses Cardiac: RRR; no murmurs, rubs, or gallops,no edema  Respiratory:  clear to auscultation bilaterally, normal work of breathing GI: soft, nontender,  nondistended, + BS MS: no deformity or atrophy  Skin: warm and dry, no rash Neuro:  Alert and Oriented x 3, Strength and sensation are intact Psych: euthymic mood, full affect  Wt Readings from Last 3 Encounters:  11/11/16 239 lb 6.4 oz (108.6 kg)  07/19/16 240 lb 6.4 oz (109 kg)  03/18/16 247 lb (112 kg)      Studies/Labs Reviewed:   EKG:  EKG  none  Recent Labs: 02/27/2016: Hemoglobin 13.1; Platelets 190 04/29/2016: ALT 32; ALT 32; BUN 12; Creat 1.25; Potassium 3.8; Sodium 138   Lipid Panel    Component Value Date/Time   CHOL 109 07/20/2016 1030   TRIG 47 07/20/2016 1030   HDL 41 07/20/2016 1030   CHOLHDL 2.7 07/20/2016 1030   LDLCALC 59 07/20/2016 1030    Additional studies/ records that were reviewed today include:  No new functional or imaging data    ASSESSMENT:    1. Coronary artery disease involving native coronary artery of native heart with angina pectoris ([place])   2. Essential hypertension   3. Dyslipidemia      PLAN:  In order of problems listed above:  1. Aerobic activity, notify if shortness of breath or chest discomfort, LDL less than 70, blood pressure less than 130/90 mmHg. 2. Good blood pressure control. Current regimen includes Norvasc and very low-dose ACE inhibitor therapy. We will discontinue the ACE inhibitor. Monitor blood pressure. 3. LDL target less than 70. Continue high-dose statin therapy. Check statin panel in March.  Stable with out angina. Lisinopril will be discontinued as it seems superfluous at this point especially at the very low intensity dose currently being use. Monitor blood pressure and goal is to keep less than 130/90 mmHg. Aerobic activity. Weight control. Notify if cardiopulmonary symptoms. One-year follow-up.  Medication Adjustments/Labs and Tests Ordered: Current medicines are reviewed at length with the patient today.  Concerns regarding medicines are outlined above.  Medication changes, Labs and Tests ordered today  are listed in the Patient Instructions below. Patient Instructions  Medication Instructions:  1) STOP Lisinopril  Labwork: Lipid and liver in March 2019  Testing/Procedures: None  Follow-Up: Your physician wants you to follow-up in: 1 year with Dr. Tamala Julian.  You will receive a reminder letter in the mail two months in advance. If you don't receive a letter, please call our office to schedule the follow-up appointment.   Any Other Special Instructions Will Be Listed Below (If Applicable).     If you need a refill on your cardiac medications before your next appointment, please call your pharmacy.      Signed, Sinclair Grooms, MD  11/11/2016 10:02 AM    [place],  , [place]  [address] Phone: [phone]; Fax: [phone]  

## 2016-11-11 ENCOUNTER — Encounter (INDEPENDENT_AMBULATORY_CARE_PROVIDER_SITE_OTHER): Payer: Self-pay

## 2016-11-11 ENCOUNTER — Ambulatory Visit (INDEPENDENT_AMBULATORY_CARE_PROVIDER_SITE_OTHER): Payer: BLUE CROSS/BLUE SHIELD | Admitting: Interventional Cardiology

## 2016-11-11 ENCOUNTER — Encounter: Payer: Self-pay | Admitting: Interventional Cardiology

## 2016-11-11 VITALS — BP 132/76 | HR 101 | Ht 71.0 in | Wt 239.4 lb

## 2016-11-11 DIAGNOSIS — E785 Hyperlipidemia, unspecified: Secondary | ICD-10-CM

## 2016-11-11 DIAGNOSIS — I25119 Atherosclerotic heart disease of native coronary artery with unspecified angina pectoris: Secondary | ICD-10-CM | POA: Diagnosis not present

## 2016-11-11 DIAGNOSIS — I10 Essential (primary) hypertension: Secondary | ICD-10-CM

## 2016-11-11 NOTE — Patient Instructions (Signed)
Medication Instructions:  1) STOP Lisinopril  Labwork: Lipid and liver in March 2019  Testing/Procedures: None  Follow-Up: Your physician wants you to follow-up in: 1 year with Dr. Tamala Julian.  You will receive a reminder letter in the mail two months in advance. If you don't receive a letter, please call our office to schedule the follow-up appointment.   Any Other Special Instructions Will Be Listed Below (If Applicable).     If you need a refill on your cardiac medications before your next appointment, please call your pharmacy.

## 2016-11-24 ENCOUNTER — Ambulatory Visit
Admission: RE | Admit: 2016-11-24 | Discharge: 2016-11-24 | Disposition: A | Discharge status: Home / Self Care | Payer: BLUE CROSS/BLUE SHIELD | Source: Ambulatory Visit | Attending: Internal Medicine | Admitting: Internal Medicine

## 2016-11-24 ENCOUNTER — Other Ambulatory Visit: Payer: Self-pay | Admitting: Internal Medicine

## 2016-11-24 DIAGNOSIS — M25552 Pain in left hip: Secondary | ICD-10-CM

## 2016-11-24 DIAGNOSIS — E7800 Pure hypercholesterolemia, unspecified: Secondary | ICD-10-CM | POA: Diagnosis not present

## 2016-11-24 DIAGNOSIS — Z1159 Encounter for screening for other viral diseases: Secondary | ICD-10-CM | POA: Diagnosis not present

## 2016-11-24 DIAGNOSIS — Z125 Encounter for screening for malignant neoplasm of prostate: Secondary | ICD-10-CM | POA: Diagnosis not present

## 2016-11-24 DIAGNOSIS — Z23 Encounter for immunization: Secondary | ICD-10-CM | POA: Diagnosis not present

## 2016-11-24 DIAGNOSIS — Z1389 Encounter for screening for other disorder: Secondary | ICD-10-CM | POA: Diagnosis not present

## 2016-11-24 DIAGNOSIS — Z0000 Encounter for general adult medical examination without abnormal findings: Secondary | ICD-10-CM | POA: Diagnosis not present

## 2016-11-24 DIAGNOSIS — I2510 Atherosclerotic heart disease of native coronary artery without angina pectoris: Secondary | ICD-10-CM | POA: Diagnosis not present

## 2016-11-24 DIAGNOSIS — N182 Chronic kidney disease, stage 2 (mild): Secondary | ICD-10-CM | POA: Diagnosis not present

## 2016-12-22 DIAGNOSIS — H11153 Pinguecula, bilateral: Secondary | ICD-10-CM | POA: Diagnosis not present

## 2016-12-22 DIAGNOSIS — H524 Presbyopia: Secondary | ICD-10-CM | POA: Diagnosis not present

## 2016-12-22 DIAGNOSIS — H43393 Other vitreous opacities, bilateral: Secondary | ICD-10-CM | POA: Diagnosis not present

## 2017-02-21 ENCOUNTER — Other Ambulatory Visit: Payer: Self-pay | Admitting: Cardiology

## 2017-03-04 DIAGNOSIS — N182 Chronic kidney disease, stage 2 (mild): Secondary | ICD-10-CM | POA: Diagnosis not present

## 2017-03-04 DIAGNOSIS — E7800 Pure hypercholesterolemia, unspecified: Secondary | ICD-10-CM | POA: Diagnosis not present

## 2017-03-04 DIAGNOSIS — Z23 Encounter for immunization: Secondary | ICD-10-CM | POA: Diagnosis not present

## 2017-03-04 DIAGNOSIS — I10 Essential (primary) hypertension: Secondary | ICD-10-CM | POA: Diagnosis not present

## 2017-05-31 DIAGNOSIS — E7800 Pure hypercholesterolemia, unspecified: Secondary | ICD-10-CM | POA: Diagnosis not present

## 2017-05-31 DIAGNOSIS — I2510 Atherosclerotic heart disease of native coronary artery without angina pectoris: Secondary | ICD-10-CM | POA: Diagnosis not present

## 2017-05-31 DIAGNOSIS — N529 Male erectile dysfunction, unspecified: Secondary | ICD-10-CM | POA: Diagnosis not present

## 2017-05-31 DIAGNOSIS — I10 Essential (primary) hypertension: Secondary | ICD-10-CM | POA: Diagnosis not present

## 2017-06-07 ENCOUNTER — Telehealth: Payer: Self-pay

## 2017-06-07 NOTE — Telephone Encounter (Signed)
   [facility] Pre-operative Risk Assessment    Request for surgical clearance:  1. What type of surgery is being performed? Extraction (simple or surgical) / Implant Therapy (possible)  2. When is this surgery scheduled? Pending clearance   3. Are there any medications that need to be held prior to surgery and how long? Please advise   4. Practice name and name of physician performing surgery? [facility]  What is your office phone and fax number? Phone: [phone] Fax: N/A  5. Anesthesia type (None, local, MAC, general) ? Local anesthetic (with epinephrine)   Jeffrey [name] 06/07/2017, 2:02 PM  _________________________________________________________________   (provider comments below)

## 2017-06-07 NOTE — Telephone Encounter (Signed)
   Primary Cardiologist:Henry Nicholes Stairs III, MD  Chart reviewed as part of pre-operative protocol coverage. Because of Jeffrey [name]'s past medical history and time since last visit, he/she will require a follow-up visit in order to better assess preoperative cardiovascular risk.  Pre-op covering staff: - Please schedule appointment and call patient to inform them. - Please contact requesting surgeon's office via preferred method (i.e, phone, fax) to inform them of need for appointment prior to surgery.  Rosaria Ferries, PA-C  06/07/2017, 8:50 PM

## 2017-06-09 NOTE — Telephone Encounter (Signed)
Attempt to call requesting office to notify-office is closed and will reopen tomorrow at 7am. No fax # available.     Sent to scheduling to arrange follow up appt.

## 2017-07-08 ENCOUNTER — Telehealth: Payer: Self-pay | Admitting: Interventional Cardiology

## 2017-07-08 DIAGNOSIS — E785 Hyperlipidemia, unspecified: Secondary | ICD-10-CM

## 2017-07-08 NOTE — Telephone Encounter (Signed)
New message ° ° ° °Patient returning call to nurse °

## 2017-07-08 NOTE — Telephone Encounter (Signed)
Pt due for lipid and liver.  Scheduled pt to come in for labs on 3/27.  Pt in agreement with plan.

## 2017-07-22 ENCOUNTER — Other Ambulatory Visit: Payer: Self-pay | Admitting: Interventional Cardiology

## 2017-08-10 ENCOUNTER — Other Ambulatory Visit: Payer: BLUE CROSS/BLUE SHIELD

## 2017-08-17 ENCOUNTER — Other Ambulatory Visit: Payer: BLUE CROSS/BLUE SHIELD | Admitting: *Deleted

## 2017-08-17 DIAGNOSIS — E785 Hyperlipidemia, unspecified: Secondary | ICD-10-CM | POA: Diagnosis not present

## 2017-08-17 LAB — HEPATIC FUNCTION PANEL
ALT: 36 IU/L (ref 0–44)
AST: 28 IU/L (ref 0–40)
Albumin: 3.9 g/dL (ref 3.6–4.8)
Alkaline Phosphatase: 57 IU/L (ref 39–117)
Bilirubin Total: 0.6 mg/dL (ref 0.0–1.2)
Bilirubin, Direct: 0.19 mg/dL (ref 0.00–0.40)
Total Protein: 6.8 g/dL (ref 6.0–8.5)

## 2017-08-17 LAB — LIPID PANEL
CHOLESTEROL TOTAL: 113 mg/dL (ref 100–199)
Chol/HDL Ratio: 2.6 ratio (ref 0.0–5.0)
HDL: 43 mg/dL (ref >39)
LDL Calculated: 61 mg/dL (ref 0–99)
Triglycerides: 47 mg/dL (ref 0–149)
VLDL CHOLESTEROL CAL: 9 mg/dL (ref 5–40)

## 2017-09-08 ENCOUNTER — Other Ambulatory Visit: Payer: Self-pay | Admitting: Interventional Cardiology

## 2017-09-30 ENCOUNTER — Encounter: Payer: Self-pay | Admitting: Interventional Cardiology

## 2017-10-20 NOTE — Progress Notes (Signed)
Cardiology Office Note    Date:  10/21/2017   ID:  Jeffrey [name], DOB [birth year], MRN [id number]  PCP:  Wenda Low, MD  Cardiologist: Sinclair Grooms, MD   Chief Complaint  Patient presents with  . Coronary Artery Disease    History of Present Illness:  Jeffrey [name] is a 62 y.o. male who presents for follow-up afterundergoing PCI 02/26/16 to LCX. He hasresidual 85% OM1, 50% LAD, 50% RCA and normal LVF at cath. Since stent / NSTEMI, he has been asymptomatic.  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  He denies angina.  He is active and playing golf on a regular basis.  He has not needed nitroglycerin.  He denies dyspnea.  He has not had palpitations or syncope.  He voices no complaints and has no medication side effects.  Has not been as deliberate with a aerobic activity as previous.   Past Medical History:  Diagnosis Date  . Chest wall abscess 2012   "lanced it & it's gone"  . Elevated cholesterol   . GERD (gastroesophageal reflux disease)     Past Surgical History:  Procedure Laterality Date  . CARDIAC CATHETERIZATION N/A 02/26/2016   Procedure: Left Heart Cath and Coronary Angiography;  Surgeon: Belva Crome, MD;  Location: [facility];  Service: Cardiovascular;  Laterality: N/A;  . CARDIAC CATHETERIZATION N/A 02/26/2016   Procedure: Coronary Stent Intervention;  Surgeon: Belva Crome, MD;  Location: [facility];  Service: Cardiovascular;  Laterality: N/A;  . COLONOSCOPY  2007  . COLONOSCOPY WITH PROPOFOL N/A 02/10/2016   Procedure: COLONOSCOPY WITH PROPOFOL;  Surgeon: Garlan Fair, MD;  Location: [facility];  Service: Endoscopy;  Laterality: N/A;  . CORONARY ANGIOPLASTY WITH STENT PLACEMENT  02/26/2016    Current Medications: Outpatient Medications Prior to Visit  Medication Sig Dispense Refill  . aspirin 81 MG tablet Take 81 mg by mouth daily.      . Multiple Vitamin (MULTIVITAMIN WITH MINERALS) TABS tablet Take 1 tablet by mouth daily.    Marland Kitchen amLODipine  (NORVASC) 5 MG tablet TAKE ONE TABLET BY MOUTH DAILY. 90 tablet 3  . atorvastatin (LIPITOR) 80 MG tablet TAKE ONE TABLET BY MOUTH DAILY AT 6 PM. 90 tablet 3  . clopidogrel (PLAVIX) 75 MG tablet TAKE 1 TABLET BY MOUTH ONCE DAILY 90 tablet 1  . nitroGLYCERIN (NITROSTAT) 0.4 MG SL tablet DISSOLVE ONE TABLET UNDER THE TONGUE EVERY 5 MINUTES AS NEEDED FOR CHEST PAIN. 25 tablet 0   No facility-administered medications prior to visit.      Allergies:   Brilinta [ticagrelor]   Social History   Socioeconomic History  . Marital status: Married    Spouse name: Not on file  . Number of children: Not on file  . Years of education: Not on file  . Highest education level: Not on file  Occupational History  . Not on file  Social Needs  . Financial resource strain: Not on file  . Food insecurity:    Worry: Not on file    Inability: Not on file  . Transportation needs:    Medical: Not on file    Non-medical: Not on file  Tobacco Use  . Smoking status: Former Smoker    Types: Cigars    Last attempt to quit: 01/16/2016    Years since quitting: 1.7  . Smokeless tobacco: Never Used  . Tobacco comment: 02/26/2016 "might smoke 1/month"  Substance and Sexual Activity  . Alcohol use: Yes  Alcohol/week: 10.8 oz    Types: 18 Cans of beer per week  . Drug use: No  . Sexual activity: Not on file  Lifestyle  . Physical activity:    Days per week: Not on file    Minutes per session: Not on file  . Stress: Not on file  Relationships  . Social connections:    Talks on phone: Not on file    Gets together: Not on file    Attends religious service: Not on file    Active member of club or organization: Not on file    Attends meetings of clubs or organizations: Not on file    Relationship status: Not on file  Other Topics Concern  . Not on file  Social History Narrative  . Not on file     Family History:  The patient's family history includes Hypertension in his sister; Stroke in his brother.    ROS:   Please see the history of present illness.    None All other systems reviewed and are negative.   PHYSICAL EXAM:   VS:  BP 136/84   Pulse (!) 51   Ht 5\' 11"  (1.803 m)   Wt 252 lb 12.8 oz (114.7 kg)   BMI 35.26 kg/m    GEN: Well nourished, well developed, in no acute distress  HEENT: normal  Neck: no JVD, carotid bruits, or masses Cardiac: RRR; no murmurs, rubs, or gallops,no edema  Respiratory:  clear to auscultation bilaterally, normal work of breathing GI: soft, nontender, nondistended, + BS MS: no deformity or atrophy  Skin: warm and dry, no rash Neuro:  Alert and Oriented x 3, Strength and sensation are intact Psych: euthymic mood, full affect  Wt Readings from Last 3 Encounters:  10/21/17 252 lb 12.8 oz (114.7 kg)  11/11/16 239 lb 6.4 oz (108.6 kg)  07/19/16 240 lb 6.4 oz (109 kg)      Studies/Labs Reviewed:   EKG:  EKG left anterior hemiblock, sinus bradycardia, small lateral Q waves I, aVL, and V6.  When compared to prior tracing from 2017, lateral T wave abnormality has resolved.  Recent Labs: 08/17/2017: ALT 36   Lipid Panel    Component Value Date/Time   CHOL 113 08/17/2017 1015   TRIG 47 08/17/2017 1015   HDL 43 08/17/2017 1015   CHOLHDL 2.6 08/17/2017 1015   LDLCALC 61 08/17/2017 1015    Additional studies/ records that were reviewed today include:  No imaging since cardiac catheterization in 2017.    ASSESSMENT:    1. Coronary artery disease involving native coronary artery of native heart with angina pectoris ([place])   2. Dyslipidemia   3. Essential hypertension   4. Abnormal ECG      PLAN:  In order of problems listed above:  1. Doing well.  No angina.  Has chronic stable coronary disease without angina stable on medical therapy including amlodipine and nitrates. 2. LDL target less than 70.  Most recently 17 in May 2019.  Continue same therapy. 3. Target blood pressure 130/80 mmHg.  Salt restriction and aerobic exercise were  encouraged. 4. Improved in appearance.  I encouraged aerobic activity.  He needs 150 minutes of moderate aerobic activity per week.  Clinical follow-up in 1 year.  He should call if any chest discomfort or symptoms concerning for myocardial ischemia.  Medication Adjustments/Labs and Tests Ordered: Current medicines are reviewed at length with the patient today.  Concerns regarding medicines are outlined above.  Medication changes, Labs  and Tests ordered today are listed in the Patient Instructions below. Patient Instructions  Medication Instructions:  Your physician recommends that you continue on your current medications as directed. Please refer to the Current Medication list given to you today.   Labwork: None  Testing/Procedures: None  Follow-Up: Your physician wants you to follow-up in: 1 year with Dr. Tamala Julian.  You will receive a reminder letter in the mail two months in advance. If you don't receive a letter, please call our office to schedule the follow-up appointment.   Any Other Special Instructions Will Be Listed Below (If Applicable).     If you need a refill on your cardiac medications before your next appointment, please call your pharmacy.      Signed, Sinclair Grooms, MD  10/21/2017 11:01 AM    [address], [place], [place]  [address] Phone: [phone]; Fax: [phone]

## 2017-10-21 ENCOUNTER — Ambulatory Visit (INDEPENDENT_AMBULATORY_CARE_PROVIDER_SITE_OTHER): Payer: BLUE CROSS/BLUE SHIELD | Admitting: Interventional Cardiology

## 2017-10-21 ENCOUNTER — Encounter: Payer: Self-pay | Admitting: Interventional Cardiology

## 2017-10-21 VITALS — BP 136/84 | HR 51 | Ht 71.0 in | Wt 252.8 lb

## 2017-10-21 DIAGNOSIS — E785 Hyperlipidemia, unspecified: Secondary | ICD-10-CM

## 2017-10-21 DIAGNOSIS — R9431 Abnormal electrocardiogram [ECG] [EKG]: Secondary | ICD-10-CM | POA: Diagnosis not present

## 2017-10-21 DIAGNOSIS — I25119 Atherosclerotic heart disease of native coronary artery with unspecified angina pectoris: Secondary | ICD-10-CM

## 2017-10-21 DIAGNOSIS — I10 Essential (primary) hypertension: Secondary | ICD-10-CM

## 2017-10-21 MED ORDER — NITROGLYCERIN 0.4 MG SL SUBL: SUBLINGUAL_TABLET | SUBLINGUAL | 3 refills | Status: DC

## 2017-10-21 MED ORDER — ATORVASTATIN CALCIUM 80 MG PO TABS: ORAL_TABLET | ORAL | 3 refills | Status: DC

## 2017-10-21 MED ORDER — AMLODIPINE BESYLATE 5 MG PO TABS: 5.0000 mg | ORAL_TABLET | Freq: Every day | ORAL | 3 refills | Status: DC

## 2017-10-21 MED ORDER — CLOPIDOGREL BISULFATE 75 MG PO TABS: 75.0000 mg | ORAL_TABLET | Freq: Every day | ORAL | 1 refills | Status: DC

## 2017-10-21 NOTE — Patient Instructions (Signed)
Medication Instructions:  Your physician recommends that you continue on your current medications as directed. Please refer to the Current Medication list given to you today.   Labwork: None   Testing/Procedures: None   Follow-Up: Your physician wants you to follow-up in 1 year with Dr. Saxby. You will receive a reminder letter in the mail two months in advance. If you don't receive a letter, please call our office to schedule the follow-up appointment.   Any Other Special Instructions Will Be Listed Below (If Applicable).     If you need a refill on your cardiac medications before your next appointment, please call your pharmacy.   

## 2017-12-07 DIAGNOSIS — Z0000 Encounter for general adult medical examination without abnormal findings: Secondary | ICD-10-CM | POA: Diagnosis not present

## 2017-12-07 DIAGNOSIS — Z1389 Encounter for screening for other disorder: Secondary | ICD-10-CM | POA: Diagnosis not present

## 2017-12-07 DIAGNOSIS — E7800 Pure hypercholesterolemia, unspecified: Secondary | ICD-10-CM | POA: Diagnosis not present

## 2018-03-21 ENCOUNTER — Telehealth: Payer: Self-pay

## 2018-03-21 ENCOUNTER — Telehealth: Payer: Self-pay | Admitting: Interventional Cardiology

## 2018-03-21 NOTE — Telephone Encounter (Signed)
   [facility] Pre-operative Risk Assessment    Request for surgical clearance:  1. What type of surgery is being performed? Extraction   2. When is this surgery scheduled? TBD   3. What type of clearance is required (medical clearance vs. Pharmacy clearance to hold med vs. Both)? Pharmacy  4. Are there any medications that need to be held prior to surgery and how long? Not specified but pt is on Plavix and ASA    5. Practice name and name of physician performing surgery? [facility]... Dr. Voncille Lo  6. What is your office phone number [phone]    7.   What is your office fax number [phone]  8.   Anesthesia type (None, local, MAC, general) ?  Local

## 2018-03-21 NOTE — Telephone Encounter (Signed)
Walk In pt Form-Surgical Clearance dropped off placed in Dr.Stream doc box.

## 2018-03-23 NOTE — Telephone Encounter (Signed)
   Primary Cardiologist: Sinclair Grooms, MD  Chart reviewed as part of pre-operative protocol coverage. Simple dental extractions are considered low risk procedures per guidelines and generally do not require any specific cardiac clearance. It is also generally accepted that for simple extractions and dental cleanings, there is no need to interrupt blood thinner therapy.   SBE prophylaxis is not required for the patient.  I will route this recommendation to the requesting party via Epic fax function and remove from pre-op pool.  Please call with questions.  Charlie Pitter, PA-C 03/23/2018, 3:32 PM

## 2018-05-31 DIAGNOSIS — N182 Chronic kidney disease, stage 2 (mild): Secondary | ICD-10-CM | POA: Diagnosis not present

## 2018-05-31 DIAGNOSIS — I10 Essential (primary) hypertension: Secondary | ICD-10-CM | POA: Diagnosis not present

## 2018-05-31 DIAGNOSIS — E7800 Pure hypercholesterolemia, unspecified: Secondary | ICD-10-CM | POA: Diagnosis not present

## 2018-05-31 DIAGNOSIS — I2510 Atherosclerotic heart disease of native coronary artery without angina pectoris: Secondary | ICD-10-CM | POA: Diagnosis not present

## 2018-06-12 ENCOUNTER — Other Ambulatory Visit: Payer: Self-pay | Admitting: Interventional Cardiology

## 2018-10-10 ENCOUNTER — Telehealth: Payer: Self-pay | Admitting: Interventional Cardiology

## 2018-10-10 NOTE — Telephone Encounter (Signed)
Spoke with pt in regards to appt scheduled with Dr. Tamala Julian on 6/11.  Pt agreeable to change to virtual visit on 5/28.  Reviewed consent.  Pt aware to have vitals available when we call that morning.  Pt verbalized understanding and was in agreement with this plan.     Virtual Visit Pre-Appointment Phone Call  "(Name), I am calling you today to discuss your upcoming appointment. We are currently trying to limit exposure to the virus that causes COVID-19 by seeing patients at home rather than in the office."  1. "What is the BEST phone number to call the day of the visit?" - include this in appointment notes  2. "Do you have or have access to (through a family member/friend) a smartphone with video capability that we can use for your visit?" a. If yes - list this number in appt notes as "cell" (if different from BEST phone #) and list the appointment type as a VIDEO visit in appointment notes b. If no - list the appointment type as a PHONE visit in appointment notes  3. Confirm consent - "In the setting of the current Covid19 crisis, you are scheduled for a (phone or video) visit with your provider on (date) at (time).  Just as we do with many in-office visits, in order for you to participate in this visit, we must obtain consent.  If you'd like, I can send this to your mychart (if signed up) or email for you to review.  Otherwise, I can obtain your verbal consent now.  All virtual visits are billed to your insurance company just like a normal visit would be.  By agreeing to a virtual visit, we'd like you to understand that the technology does not allow for your provider to perform an examination, and thus may limit your provider's ability to fully assess your condition. If your provider identifies any concerns that need to be evaluated in person, we will make arrangements to do so.  Finally, though the technology is pretty good, we cannot assure that it will always work on either your or our end, and in  the setting of a video visit, we may have to convert it to a phone-only visit.  In either situation, we cannot ensure that we have a secure connection.  Are you willing to proceed?" STAFF: Did the patient verbally acknowledge consent to telehealth visit? Document YES/NO here: yes  4. Advise patient to be prepared - "Two hours prior to your appointment, go ahead and check your blood pressure, pulse, oxygen saturation, and your weight (if you have the equipment to check those) and write them all down. When your visit starts, your provider will ask you for this information. If you have an Apple Watch or Kardia device, please plan to have heart rate information ready on the day of your appointment. Please have a pen and paper handy nearby the day of the visit as well."  5. Give patient instructions for MyChart download to smartphone OR Doximity/Doxy.me as below if video visit (depending on what platform provider is using)  6. Inform patient they will receive a phone call 15 minutes prior to their appointment time (may be from unknown caller ID) so they should be prepared to answer    TELEPHONE CALL NOTE  [facility] [place] has been deemed a candidate for a follow-up tele-health visit to limit community exposure during the Covid-19 pandemic. I spoke with the patient via phone to ensure availability of phone/video source, confirm preferred email &  phone number, and discuss instructions and expectations.  I reminded Jeffrey [name] to be prepared with any vital sign and/or heart rhythm information that could potentially be obtained via home monitoring, at the time of his visit. I reminded Antwoin Lackey to expect a phone call prior to his visit.  Terease Marcotte L, LPN 7/90/2409 73:53 AM   INSTRUCTIONS FOR DOWNLOADING THE MYCHART APP TO SMARTPHONE  - The patient must first make sure to have activated MyChart and know their login information - If Apple, go to [employer] and type in MyChart in the search bar and  download the app. If Android, ask patient to go to [employer] and type in [place] in the search bar and download the app. The app is free but as with any other app downloads, their phone may require them to verify saved payment information or Apple/Android password.  - The patient will need to then log into the app with their MyChart username and password, and select [facility] as their healthcare provider to link the account. When it is time for your visit, go to the MyChart app, find appointments, and click Begin Video Visit. Be sure to Select Allow for your device to access the Microphone and Camera for your visit. You will then be connected, and your provider will be with you shortly.  **If they have any issues connecting, or need assistance please contact MyChart service desk [phone])**  **If using a computer, in order to ensure the best quality for their visit they will need to use either of the following Internet Browsers: [employer], or Google Chrome**  IF USING DOXIMITY or DOXY.ME - The patient will receive a link just prior to their visit by text.     FULL LENGTH CONSENT FOR TELE-HEALTH VISIT   I hereby voluntarily request, consent and authorize [facility] and its employed or contracted physicians, physician assistants, nurse practitioners or other licensed health care professionals (the Practitioner), to provide me with telemedicine health care services (the "Services") as deemed necessary by the treating Practitioner. I acknowledge and consent to receive the Services by the Practitioner via telemedicine. I understand that the telemedicine visit will involve communicating with the Practitioner through live audiovisual communication technology and the disclosure of certain medical information by electronic transmission. I acknowledge that I have been given the opportunity to request an in-person assessment or other available alternative prior to the  telemedicine visit and am voluntarily participating in the telemedicine visit.  I understand that I have the right to withhold or withdraw my consent to the use of telemedicine in the course of my care at any time, without affecting my right to future care or treatment, and that the Practitioner or I may terminate the telemedicine visit at any time. I understand that I have the right to inspect all information obtained and/or recorded in the course of the telemedicine visit and may receive copies of available information for a reasonable fee.  I understand that some of the potential risks of receiving the Services via telemedicine include:  Marland Kitchen Delay or interruption in medical evaluation due to technological equipment failure or disruption; . Information transmitted may not be sufficient (e.g. poor resolution of images) to allow for appropriate medical decision making by the Practitioner; and/or  . In rare instances, security protocols could fail, causing a breach of personal health information.  Furthermore, I acknowledge that it is my responsibility to provide information about my medical history, conditions and care that is complete  and accurate to the best of my ability. I acknowledge that Practitioner's advice, recommendations, and/or decision may be based on factors not within their control, such as incomplete or inaccurate data provided by me or distortions of diagnostic images or specimens that may result from electronic transmissions. I understand that the practice of medicine is not an exact science and that Practitioner makes no warranties or guarantees regarding treatment outcomes. I acknowledge that I will receive a copy of this consent concurrently upon execution via email to the email address I last provided but may also request a printed copy by calling the office of [facility].    I understand that my insurance will be billed for this visit.   I have read or had this consent read to me.  . I understand the contents of this consent, which adequately explains the benefits and risks of the Services being provided via telemedicine.  . I have been provided ample opportunity to ask questions regarding this consent and the Services and have had my questions answered to my satisfaction. . I give my informed consent for the services to be provided through the use of telemedicine in my medical care  By participating in this telemedicine visit I agree to the above.

## 2018-10-11 NOTE — Progress Notes (Signed)
Virtual Visit via Video Note   This visit type was conducted due to national recommendations for restrictions regarding the COVID-19 Pandemic (e.g. social distancing) in an effort to limit this patient's exposure and mitigate transmission in our community.  Due to his co-morbid illnesses, this patient is at least at moderate risk for complications without adequate follow up.  This format is felt to be most appropriate for this patient at this time.  All issues noted in this document were discussed and addressed.  A limited physical exam was performed with this format.  Please refer to the patient's chart for his consent to telehealth for [facility].   Date:  10/12/2018   ID:  Jeffrey [name], DOB March 25, 1956, MRN [id number]  Patient Location: Home Provider Location: Office  PCP:  Wenda Low, MD  Cardiologist:  Sinclair Grooms, MD  Electrophysiologist:  None   Evaluation Performed:  Follow-Up Visit  Chief Complaint:  CAD  History of Present Illness:    Jeffrey [name] is a 63 y.o. male with  who presents for follow-up afterundergoing PCI 02/26/16 to LCX. He hasresidual 85% OM1, 50% LAD, 50% RCA and normal LVF at cath. Since stent / NSTEMI, he has been asymptomatic.Comorbidities: obesity, Htn, anf hyperlipidemia.  Elmyra Ricks is doing well and denies anginal symptoms.  He is remaining active.  He has not needed nitroglycerin.  He plays golf several times per week.  He has had to do a lot more walking with the course being wet.  No difficulty with breathing.  Weight continues to be an issue.  He denies dyspnea.  No significant palpitations.  The patient does not have symptoms concerning for COVID-19 infection (fever, chills, cough, or new shortness of breath).    Past Medical History:  Diagnosis Date  . Chest wall abscess 2012   "lanced it & it's gone"  . Elevated cholesterol   . GERD (gastroesophageal reflux disease)    Past Surgical History:  Procedure Laterality Date  . CARDIAC  CATHETERIZATION N/A 02/26/2016   Procedure: Left Heart Cath and Coronary Angiography;  Surgeon: Belva Crome, MD;  Location: [place] [facility];  Service: Cardiovascular;  Laterality: N/A;  . CARDIAC CATHETERIZATION N/A 02/26/2016   Procedure: Coronary Stent Intervention;  Surgeon: Belva Crome, MD;  Location: [place] [facility];  Service: Cardiovascular;  Laterality: N/A;  . COLONOSCOPY  2007  . COLONOSCOPY WITH PROPOFOL N/A 02/10/2016   Procedure: COLONOSCOPY WITH PROPOFOL;  Surgeon: Garlan Fair, MD;  Location: [facility];  Service: Endoscopy;  Laterality: N/A;  . CORONARY ANGIOPLASTY WITH STENT PLACEMENT  02/26/2016     Current Meds  Medication Sig  . amLODipine (NORVASC) 5 MG tablet Take 1 tablet (5 mg total) by mouth daily.  Marland Kitchen aspirin 81 MG tablet Take 81 mg by mouth daily.    Marland Kitchen atorvastatin (LIPITOR) 80 MG tablet TAKE ONE TABLET BY MOUTH DAILY AT 6 PM.  . clopidogrel (PLAVIX) 75 MG tablet Take 1 tablet (75 mg total) by mouth daily. Please make yearly appt with Dr. Tamala Julian for June for future refills. 1st attempt  . Multiple Vitamin (MULTIVITAMIN WITH MINERALS) TABS tablet Take 1 tablet by mouth daily.  . nitroGLYCERIN (NITROSTAT) 0.4 MG SL tablet DISSOLVE ONE TABLET UNDER THE TONGUE EVERY 5 MINUTES AS NEEDED FOR CHEST PAIN.     Allergies:   Brilinta [ticagrelor]   Social History   Tobacco Use  . Smoking status: Former Smoker    Types: Cigars    Last  attempt to quit: 01/16/2016    Years since quitting: 2.7  . Smokeless tobacco: Never Used  . Tobacco comment: 02/26/2016 "might smoke 1/month"  Substance Use Topics  . Alcohol use: Yes    Alcohol/week: 18.0 standard drinks    Types: 18 Cans of beer per week  . Drug use: No     Family Hx: The patient's family history includes Hypertension in his sister; Stroke in his brother.  ROS:   Please see the history of present illness.    Some stress related to the change in overall work and life patterns during the COVID 19  pandemic.  Overall, not having significant anxiety or depression. All other systems reviewed and are negative.   Prior CV studies:   The following studies were reviewed today:  No recent CV studies.  Labs/Other Tests and Data Reviewed:    EKG:  No ECG reviewed.  Recent Labs: No results found for requested labs within last 8760 hours.   Recent Lipid Panel Lab Results  Component Value Date/Time   CHOL 113 08/17/2017 10:15 AM   TRIG 47 08/17/2017 10:15 AM   HDL 43 08/17/2017 10:15 AM   CHOLHDL 2.6 08/17/2017 10:15 AM   LDLCALC 61 08/17/2017 10:15 AM    Wt Readings from Last 3 Encounters:  10/12/18 243 lb 6.4 oz (110.4 kg)  10/21/17 252 lb 12.8 oz (114.7 kg)  11/11/16 239 lb 6.4 oz (108.6 kg)     Objective:    Vital Signs:  BP (!) 142/89   Pulse (!) 55   Ht 5\' 11"  (1.803 m)   Wt 243 lb 6.4 oz (110.4 kg)   BMI 33.95 kg/m    VITAL SIGNS:  reviewed GEN:  Overweight RESPIRATORY:  normal respiratory effort, symmetric expansion CARDIOVASCULAR:  no peripheral edema NEURO:  alert and oriented x 3, no obvious focal deficit  ASSESSMENT & PLAN:    1. Coronary artery disease involving native coronary artery of native heart with angina pectoris ([place])   2. Dyslipidemia   3. Essential hypertension   4. Morbid obesity ([place])   5. Educated About Covid-19 Virus Infection    PLAN:  1. Discussed secondary prevention. 2. When last checked 1 year ago, was below target of 70. 3. We discussed lifestyle changes to impact blood pressure.  Alcohol and nonsteroidal anti-inflammatory agents could be contributing to blood pressure. 4. Decreased carbohydrate diet recommended.  Mimicking a Mediterranean style diet is encouraged.  Overall education and awareness concerning primary/secondary risk prevention was discussed in detail: LDL less than 70, hemoglobin A1c less than 7, blood pressure target less than 130/80 mmHg, >150 minutes of moderate aerobic activity per week, avoidance of smoking,  weight control (via diet and exercise), and continued surveillance/management of/for obstructive sleep apnea.  >>> He has not had blood work this year as he saw primary care before the pandemic.  It is been over 1 year since lipids and liver panel were done.  We will draw his typical blood panel that is done by PCP to cover their upcoming appointment later this summer.  This will include PSA, TSH, comprehensive metabolic panel, CBC, and lipid panel (Karrar Hussain)   Target BP: <130/80 mmHg  Diet and lifestyle measures for BP control were reviewed in detail: Low sodium diet (<2.5 gm daily); alcohol restriction (<3 ounces per day); weight loss (Mediterranean); avoid non-steroidal agents; > 6 hours sleep per day; 150 min moderate exercise per week.   COVID-19 Education: The signs and symptoms of COVID-19 were  discussed with the patient and how to seek care for testing (follow up with PCP or arrange E-visit).  The importance of social distancing was discussed today.  Time:   Today, I have spent 15 minutes with the patient with telehealth technology discussing the above problems.     Medication Adjustments/Labs and Tests Ordered: Current medicines are reviewed at length with the patient today.  Concerns regarding medicines are outlined above.   Tests Ordered: No orders of the defined types were placed in this encounter.   Medication Changes: No orders of the defined types were placed in this encounter.   Disposition:  Follow up in 1 year(s)  Signed, Sinclair Grooms, MD  10/12/2018 11:09 AM    [place]

## 2018-10-12 ENCOUNTER — Other Ambulatory Visit: Payer: Self-pay

## 2018-10-12 ENCOUNTER — Telehealth (INDEPENDENT_AMBULATORY_CARE_PROVIDER_SITE_OTHER): Payer: BLUE CROSS/BLUE SHIELD | Admitting: Interventional Cardiology

## 2018-10-12 ENCOUNTER — Encounter: Payer: Self-pay | Admitting: Interventional Cardiology

## 2018-10-12 VITALS — BP 142/89 | HR 55 | Ht 71.0 in | Wt 243.4 lb

## 2018-10-12 DIAGNOSIS — Z7189 Other specified counseling: Secondary | ICD-10-CM

## 2018-10-12 DIAGNOSIS — E785 Hyperlipidemia, unspecified: Secondary | ICD-10-CM

## 2018-10-12 DIAGNOSIS — I10 Essential (primary) hypertension: Secondary | ICD-10-CM

## 2018-10-12 DIAGNOSIS — I25119 Atherosclerotic heart disease of native coronary artery with unspecified angina pectoris: Secondary | ICD-10-CM

## 2018-10-12 NOTE — Patient Instructions (Signed)
Medication Instructions:  Your physician recommends that you continue on your current medications as directed. Please refer to the Current Medication list given to you today.  If you need a refill on your cardiac medications before your next appointment, please call your pharmacy.   Lab work: You will need to come to the office for labs at your convenience.   If you have labs (blood work) drawn today and your tests are completely normal, you will receive your results only by: Marland Kitchen MyChart Message (if you have MyChart) OR . A paper copy in the mail If you have any lab test that is abnormal or we need to change your treatment, we will call you to review the results.  Testing/Procedures: None  Follow-Up: At [facility], you and your health needs are our priority.  As part of our continuing mission to provide you with exceptional heart care, we have created designated Provider Care Teams.  These Care Teams include your primary Cardiologist (physician) and Advanced Practice Providers (APPs -  Physician Assistants and Nurse Practitioners) who all work together to provide you with the care you need, when you need it. You will need a follow up appointment in 12 months.  Please call our office 2 months in advance to schedule this appointment.  You may see Sinclair Grooms, MD or one of the following Advanced Practice Providers on your designated Care Team:   Truitt Merle, NP Cecilie Kicks, NP . Kathyrn Drown, NP  Any Other Special Instructions Will Be Listed Below (If Applicable).

## 2018-10-15 ENCOUNTER — Other Ambulatory Visit: Payer: Self-pay | Admitting: Interventional Cardiology

## 2018-10-23 ENCOUNTER — Telehealth: Payer: Self-pay | Admitting: *Deleted

## 2018-10-23 NOTE — Telephone Encounter (Signed)
    COVID-19 Pre-Screening Questions:  . In the past 7 to 10 days have you had a cough,  shortness of breath, headache, congestion, fever (100 or greater) body aches, chills, sore throat, or sudden loss of taste or sense of smell? . Have you been around anyone with known Covid 19. . Have you been around anyone who is awaiting Covid 19 test results in the past 7 to 10 days? . Have you been around anyone who has been exposed to Covid 19, or has mentioned symptoms of Covid 19 within the past 7 to 10 days?  If you have any concerns/questions about symptoms patients report during screening (either on the phone or at threshold). Contact the provider seeing the patient or DOD for further guidance.  If neither are available contact a member of the leadership team.           Contacted patient via phone call. Left a massage on his answering service. Awaiting retun call. KB

## 2018-10-23 NOTE — Telephone Encounter (Signed)
    COVID-19 Pre-Screening Questions:  . In the past 7 to 10 days have you had a cough,  shortness of breath, headache, congestion, fever (100 or greater) body aches, chills, sore throat, or sudden loss of taste or sense of smell? . Have you been around anyone with known Covid 19. . Have you been around anyone who is awaiting Covid 19 test results in the past 7 to 10 days? . Have you been around anyone who has been exposed to Covid 19, or has mentioned symptoms of Covid 19 within the past 7 to 10 days?  If you have any concerns/questions about symptoms patients report during screening (either on the phone or at threshold). Contact the provider seeing the patient or DOD for further guidance.  If neither are available contact a member of the leadership team.           Contacted patient via phone call. No to all Covid 19 questions. Has a mask. KB  

## 2018-10-25 ENCOUNTER — Other Ambulatory Visit: Payer: Self-pay

## 2018-10-25 ENCOUNTER — Other Ambulatory Visit: Payer: BC Managed Care – PPO | Admitting: *Deleted

## 2018-10-25 DIAGNOSIS — E785 Hyperlipidemia, unspecified: Secondary | ICD-10-CM | POA: Diagnosis not present

## 2018-10-25 DIAGNOSIS — I10 Essential (primary) hypertension: Secondary | ICD-10-CM | POA: Diagnosis not present

## 2018-10-25 DIAGNOSIS — I25119 Atherosclerotic heart disease of native coronary artery with unspecified angina pectoris: Secondary | ICD-10-CM | POA: Diagnosis not present

## 2018-10-25 LAB — HEPATIC FUNCTION PANEL
ALT: 44 IU/L (ref 0–44)
AST: 37 IU/L (ref 0–40)
Albumin: 4.5 g/dL (ref 3.8–4.8)
Alkaline Phosphatase: 62 IU/L (ref 39–117)
Bilirubin Total: 0.7 mg/dL (ref 0.0–1.2)
Bilirubin, Direct: 0.12 mg/dL (ref 0.00–0.40)
Total Protein: 6.9 g/dL (ref 6.0–8.5)

## 2018-10-25 LAB — LIPID PANEL
Chol/HDL Ratio: 2.8 ratio (ref 0.0–5.0)
Cholesterol, Total: 111 mg/dL (ref 100–199)
HDL: 39 mg/dL — ABNORMAL LOW (ref >39)
LDL Calculated: 54 mg/dL (ref 0–99)
Triglycerides: 91 mg/dL (ref 0–149)
VLDL Cholesterol Cal: 18 mg/dL (ref 5–40)

## 2018-10-25 LAB — BASIC METABOLIC PANEL
BUN/Creatinine Ratio: 15 (ref 10–24)
BUN: 15 mg/dL (ref 8–27)
CO2: 24 mmol/L (ref 20–29)
Calcium: 9.7 mg/dL (ref 8.6–10.2)
Chloride: 102 mmol/L (ref 96–106)
Creatinine, Ser: 1.03 mg/dL (ref 0.76–1.27)
GFR calc Af Amer: 88 mL/min/{1.73_m2} (ref >59)
GFR calc non Af Amer: 76 mL/min/{1.73_m2} (ref >59)
Glucose: 94 mg/dL (ref 65–99)
Potassium: 3.9 mmol/L (ref 3.5–5.2)
Sodium: 137 mmol/L (ref 134–144)

## 2018-10-25 LAB — TSH: TSH: 1.4 u[IU]/mL (ref 0.450–4.500)

## 2018-10-25 LAB — PSA: Prostate Specific Ag, Serum: 0.4 ng/mL (ref 0.0–4.0)

## 2018-10-25 LAB — HEMOGLOBIN A1C
Est. average glucose Bld gHb Est-mCnc: 97 mg/dL
Hgb A1c MFr Bld: 5 % (ref 4.8–5.6)

## 2018-10-25 LAB — CBC
Hematocrit: 42.6 % (ref 37.5–51.0)
Hemoglobin: 14.4 g/dL (ref 13.0–17.7)
MCH: 32.5 pg (ref 26.6–33.0)
MCHC: 33.8 g/dL (ref 31.5–35.7)
MCV: 96 fL (ref 79–97)
Platelets: 229 10*3/uL (ref 150–450)
RBC: 4.43 x10E6/uL (ref 4.14–5.80)
RDW: 12.6 % (ref 11.6–15.4)
WBC: 5.8 10*3/uL (ref 3.4–10.8)

## 2018-10-26 ENCOUNTER — Ambulatory Visit: Payer: BLUE CROSS/BLUE SHIELD | Admitting: Interventional Cardiology

## 2018-11-30 DIAGNOSIS — Z1389 Encounter for screening for other disorder: Secondary | ICD-10-CM | POA: Diagnosis not present

## 2018-11-30 DIAGNOSIS — Z23 Encounter for immunization: Secondary | ICD-10-CM | POA: Diagnosis not present

## 2018-11-30 DIAGNOSIS — Z0000 Encounter for general adult medical examination without abnormal findings: Secondary | ICD-10-CM | POA: Diagnosis not present

## 2018-12-04 ENCOUNTER — Other Ambulatory Visit: Payer: Self-pay | Admitting: Interventional Cardiology

## 2019-01-30 DIAGNOSIS — Z86010 Personal history of colonic polyps: Secondary | ICD-10-CM | POA: Diagnosis not present

## 2019-02-07 ENCOUNTER — Telehealth: Payer: Self-pay | Admitting: *Deleted

## 2019-02-07 NOTE — Telephone Encounter (Signed)
Dr. Tamala Julian, please review, ok with you to hold plavix for 5 days prior to colonoscopy? Last PCI was 02/2016

## 2019-02-07 NOTE — Telephone Encounter (Signed)
   [facility] Pre-operative Risk Assessment    Request for surgical clearance:  1. What type of surgery is being performed? COLONOSCOPY   2. When is this surgery scheduled? 03/23/19   3. What type of clearance is required (medical clearance vs. Pharmacy clearance to hold med vs. Both)? MEDICAL  4. Are there any medications that need to be held prior to surgery and how long? PLAVIX   5. Practice name and name of physician performing surgery? [facility]; DR. Michail Sermon   6. What is your office phone number [phone]    7.   What is your office fax number [phone]  8.   Anesthesia type (None, local, MAC, general) ? NOT LISTED; PROPOFOL ?   Julaine Hua 02/07/2019, 9:31 AM  _________________________________________________________________   (provider comments below)

## 2019-02-08 NOTE — Telephone Encounter (Signed)
Okay to hold plavix. 

## 2019-02-09 NOTE — Telephone Encounter (Signed)
Left message for the patient to call back and speak to the preop APP of the day. 

## 2019-02-12 NOTE — Telephone Encounter (Signed)
Left VM

## 2019-02-12 NOTE — Telephone Encounter (Signed)
Patient returned phone call. °

## 2019-02-13 NOTE — Telephone Encounter (Signed)
   Primary Cardiologist: Sinclair Grooms, MD  Chart reviewed as part of pre-operative protocol coverage. Patient was contacted 02/13/2019 in reference to pre-operative risk assessment for pending surgery as outlined below.  Jeffrey [name] was last seen on 10/12/18 by Dr. Tamala Julian.  Since that day, Jeffrey [name] has done well. He can complete more than 4.0 METS without anginal symptoms.   Per Dr. Tamala Julian OK to hold plavix 5-7 days prior to procedure.   Given his known disease with last intervention in 2017, recommend continuing ASA during the perioperative period.   Therefore, based on ACC/AHA guidelines, the patient would be at acceptable risk for the planned procedure without further cardiovascular testing.   I will route this recommendation to the requesting party via Epic fax function and remove from pre-op pool.  Please call with questions.  We[place], PA 02/13/2019, 10:54 AM

## 2019-03-06 ENCOUNTER — Other Ambulatory Visit: Payer: Self-pay | Admitting: Interventional Cardiology

## 2019-03-06 ENCOUNTER — Other Ambulatory Visit: Payer: Self-pay

## 2019-03-06 MED ORDER — NITROGLYCERIN 0.4 MG SL SUBL: SUBLINGUAL_TABLET | SUBLINGUAL | 4 refills | Status: DC

## 2019-03-06 NOTE — Telephone Encounter (Signed)
Pt's medication was sent to pt's pharmacy as requested. Confirmation received.  °

## 2019-05-20 IMAGING — DX DG HIP (WITH OR WITHOUT PELVIS) 2-3V*L*
2 series · 2 of 2 positions shown · non-contrast
Comparison: None.

CLINICAL DATA: Left hip pain for several weeks without known
injury.

EXAM:
DG HIP (WITH OR WITHOUT PELVIS) 2-3V LEFT

[dg hip unilat w or w/o pelvis 2-3 views  (1 of 2)]
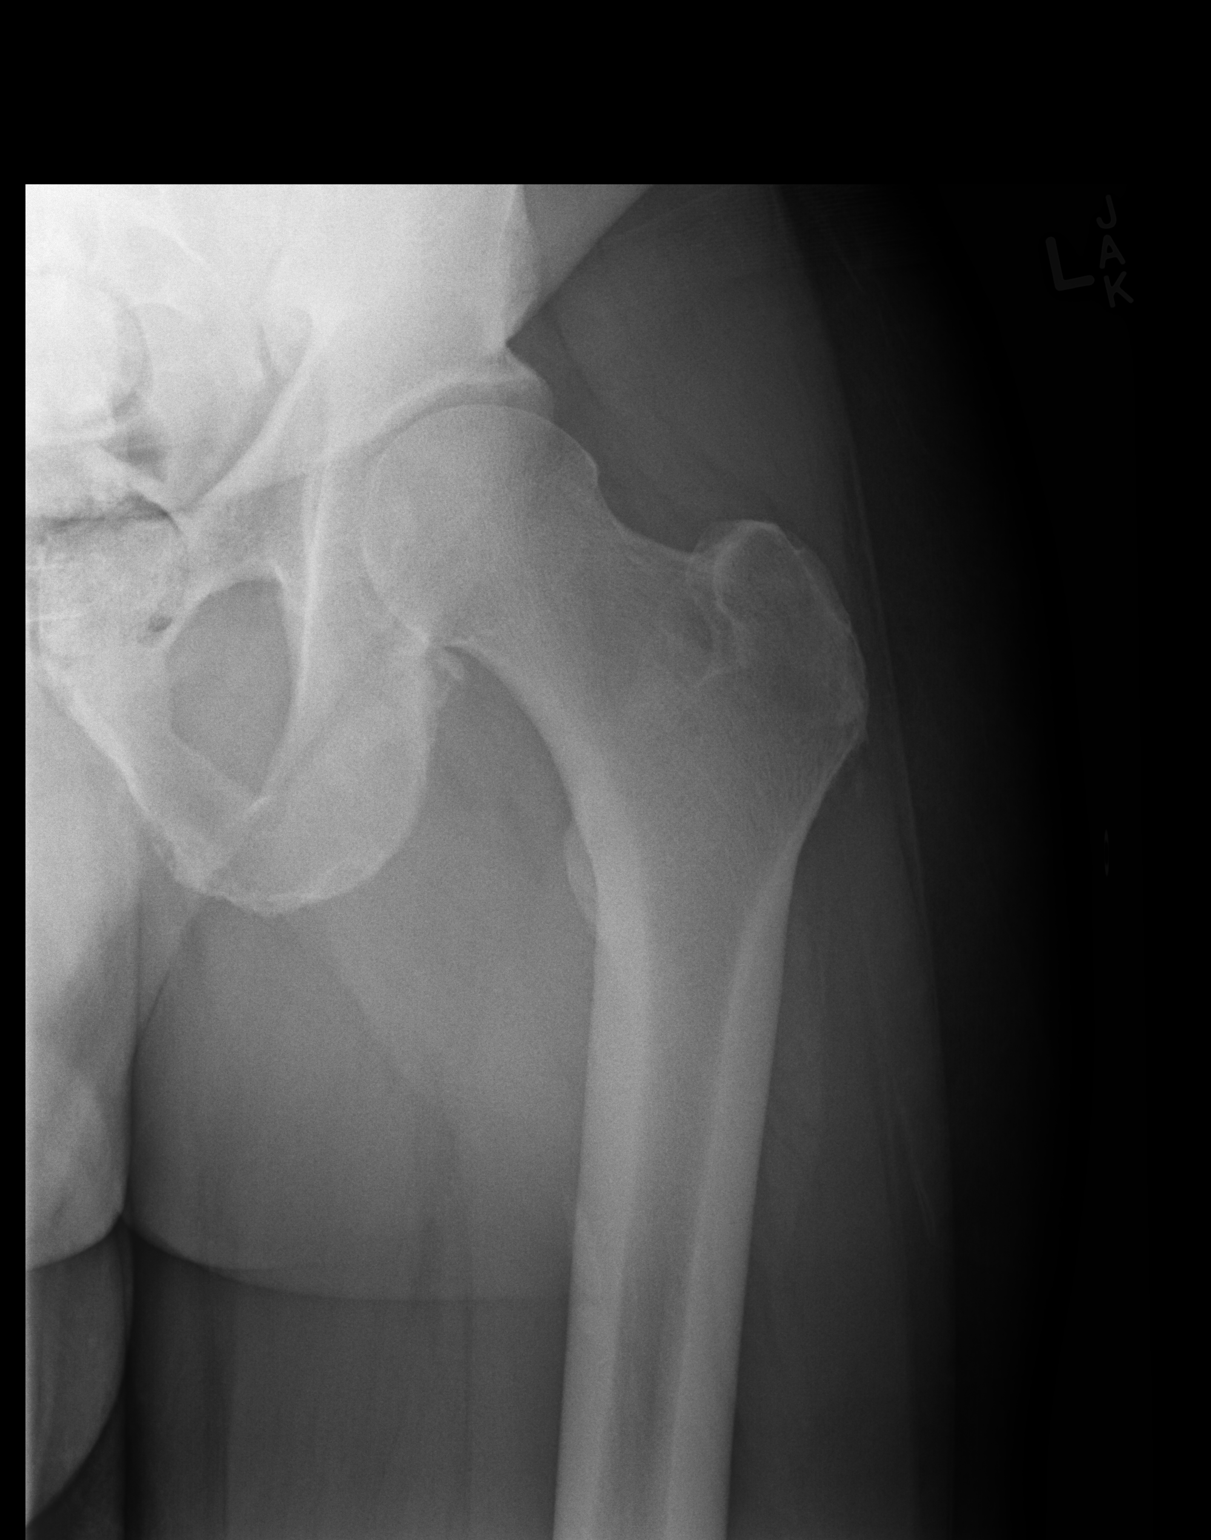

[dg hip unilat w or w/o pelvis 2-3 views  (2 of 2)]
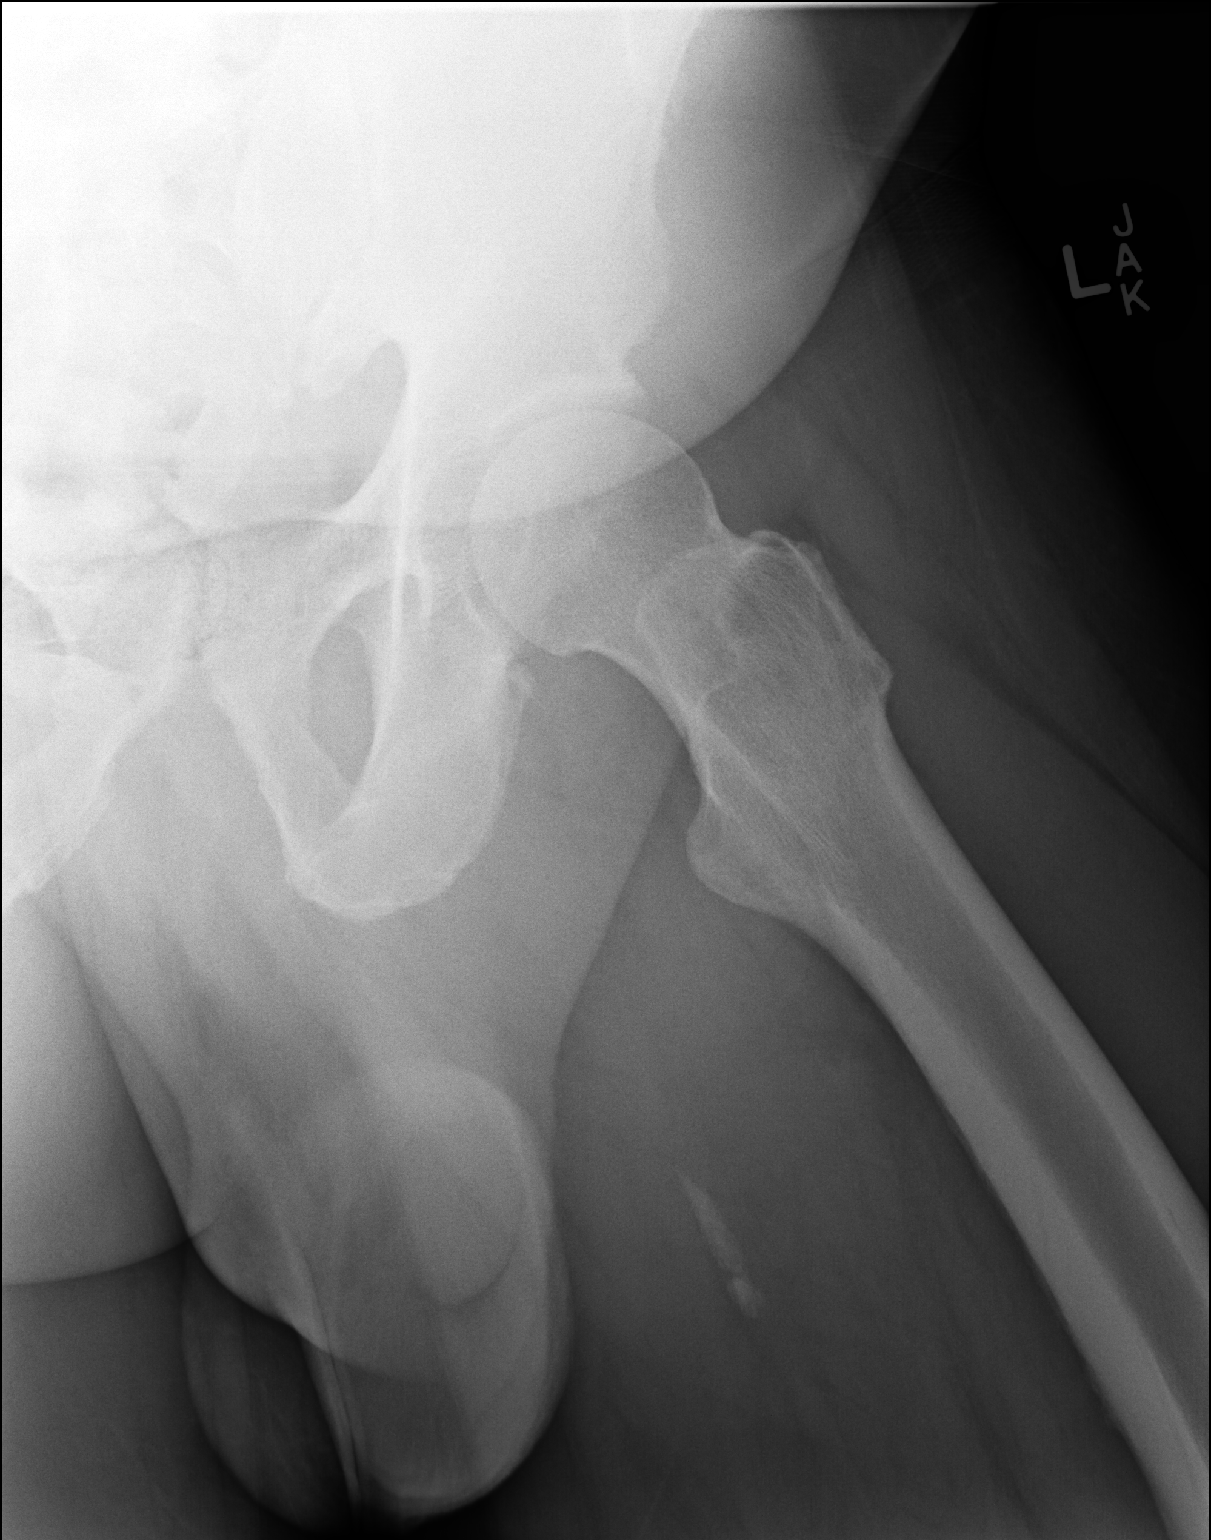

[2 of 2 positions shown; findings below may reference images not displayed]

FINDINGS: There is no evidence of hip fracture or dislocation. There is no
evidence of arthropathy or other focal bone abnormality.
IMPRESSION: Normal left hip.

## 2019-06-12 DIAGNOSIS — Z1159 Encounter for screening for other viral diseases: Secondary | ICD-10-CM | POA: Diagnosis not present

## 2019-06-15 DIAGNOSIS — K640 First degree hemorrhoids: Secondary | ICD-10-CM | POA: Diagnosis not present

## 2019-06-15 DIAGNOSIS — Z86010 Personal history of colonic polyps: Secondary | ICD-10-CM | POA: Diagnosis not present

## 2019-08-24 ENCOUNTER — Other Ambulatory Visit: Payer: Self-pay | Admitting: Interventional Cardiology

## 2019-10-13 ENCOUNTER — Other Ambulatory Visit: Payer: Self-pay | Admitting: Interventional Cardiology

## 2019-11-17 ENCOUNTER — Other Ambulatory Visit: Payer: Self-pay | Admitting: Interventional Cardiology

## 2019-12-05 NOTE — Progress Notes (Signed)
Cardiology Office Note:    Date:  12/06/2019   ID:  Jeffrey [name], DOB [birth year], MRN [id number]  PCP:  Jeffrey Low, MD  Cardiologist:  Sinclair Grooms, MD   Referring MD: Jeffrey Low, MD   Chief Complaint  Patient presents with  . Coronary Artery Disease  . Claudication  . Hypertension  . Hyperlipidemia    History of Present Illness:    Jaecob [name] is a 64 y.o. male with a hx of CAD with NSTEMI leading to PCI 02/26/16 to LCX,  85% OM1, 50% LAD, 50% RCA and normal EF, obesity, Htn, anf hyperlipidemia.  Jeffrey [name] he inquires about memory and statin therapy.  Recently read an article that raised the concern that statins can cause memory loss.  We spent considerable time discussing the pros and cons.  The pros in his case is that all data shows reduction in cardiac events if statin is a years.  I did acknowledge that there is conflicting data about memory in the literature and a relatively recent study that demonstrates decreased hippocampal activity and those on statin therapy.  There was no clinical correlation with lab data.  I will certainly keep him posted on developments or changes in recommendations.  We discussed other methods of lipid control including Is and PCSK9.  He was not interested in injectables.  The clinical data from Is is not as strong as it is for statins.  He remains physically active.  He denies angina.  He plays golf several times per week.  Sometimes he walks.  He denies claudication.  He has not had chest discomfort.  Denies blood in the stool.  We discussed secondary prevention as noted below.  Past Medical History:  Diagnosis Date  . Chest wall abscess 2012   "lanced it & it's gone"  . Elevated cholesterol   . GERD (gastroesophageal reflux disease)     Past Surgical History:  Procedure Laterality Date  . CARDIAC CATHETERIZATION N/A 02/26/2016   Procedure: Left Heart Cath and Coronary Angiography;  Surgeon: Belva Crome, MD;  Location: [place] [facility];  Service: Cardiovascular;  Laterality: N/A;  . CARDIAC CATHETERIZATION N/A 02/26/2016   Procedure: Coronary Stent Intervention;  Surgeon: Belva Crome, MD;  Location: [place] [facility];  Service: Cardiovascular;  Laterality: N/A;  . COLONOSCOPY  2007  . COLONOSCOPY WITH PROPOFOL N/A 02/10/2016   Procedure: COLONOSCOPY WITH PROPOFOL;  Surgeon: Garlan Fair, MD;  Location: [facility];  Service: Endoscopy;  Laterality: N/A;  . CORONARY ANGIOPLASTY WITH STENT PLACEMENT  02/26/2016    Current Medications: Current Meds  Medication Sig  . amLODipine (NORVASC) 5 MG tablet TAKE 1 TABLET BY MOUTH EVERY DAY  . aspirin 81 MG tablet Take 81 mg by mouth daily.    Marland Kitchen atorvastatin (LIPITOR) 80 MG tablet TAKE ONE TABLET BY MOUTH DAILY AT 6 PM.  . clopidogrel (PLAVIX) 75 MG tablet Take 1 tablet (75 mg total) by mouth daily.  . Multiple Vitamin (MULTIVITAMIN WITH MINERALS) TABS tablet Take 1 tablet by mouth daily.  . nitroGLYCERIN (NITROSTAT) 0.4 MG SL tablet DISSOLVE ONE TABLET UNDER THE TONGUE EVERY 5 MINUTES AS NEEDED FOR CHEST PAIN.     Allergies:   Brilinta [ticagrelor]   Social History   Socioeconomic History  . Marital status: Married    Spouse name: Not on file  . Number of children: Not on file  . Years of education: Not on file  . Highest education level: Not on  file  Occupational History  . Not on file  Tobacco Use  . Smoking status: Former Smoker    Types: Cigars    Quit date: 01/16/2016    Years since quitting: 3.8  . Smokeless tobacco: Never Used  . Tobacco comment: 02/26/2016 "might smoke 1/month"  Vaping Use  . Vaping Use: Never used  Substance and Sexual Activity  . Alcohol use: Yes    Alcohol/week: 18.0 standard drinks    Types: 18 Cans of beer per week  . Drug use: No  . Sexual activity: Not on file  Other Topics Concern  . Not on file  Social History Narrative  . Not on file   Social Determinants of Health   Financial Resource Strain:   .  Difficulty of Paying Living Expenses:   Food Insecurity:   . Worried About Charity fundraiser in the Last Year:   . Arboriculturist in the Last Year:   Transportation Needs:   . Film/video editor (Medical):   Marland Kitchen Lack of Transportation (Non-Medical):   Physical Activity:   . Days of Exercise per Week:   . Minutes of Exercise per Session:   Stress:   . Feeling of Stress :   Social Connections:   . Frequency of Communication with Friends and Family:   . Frequency of Social Gatherings with Friends and Family:   . Attends Religious Services:   . Active Member of Clubs or Organizations:   . Attends Archivist Meetings:   Marland Kitchen Marital Status:      Family History: The patient's family history includes Hypertension in his sister; Stroke in his brother.  ROS:   Please see the history of present illness.    He has not lost weight.  Concerned about his memory.  He is having no functional difficulty related to work or ability to care for himself.  The concern about memory is his desire to be proactive and not cause harm.  We will monitor this.  All other systems reviewed and are negative.  EKGs/Labs/Other Studies Reviewed:    The following studies were reviewed today: No new imaging/functional data.  EKG:  EKG sinus bradycardia 49 bpm.  Normal PR interval.  No evidence of infarction.  Left anterior hemiblock.  When compared to prior tracings, the heart rate is slightly still slower but otherwise unchanged from 2019  Recent Labs: No results found for requested labs within last 8760 hours.  Recent Lipid Panel    Component Value Date/Time   CHOL 111 10/25/2018 0833   TRIG 91 10/25/2018 0833   HDL 39 (L) 10/25/2018 0833   CHOLHDL 2.8 10/25/2018 0833   LDLCALC 54 10/25/2018 0833    Physical Exam:    VS:  BP (!) 142/80   Pulse (!) 49   Ht 5\' 11"  (1.803 m)   Wt 241 lb (109.3 kg)   SpO2 94%   BMI 33.61 kg/m     Wt Readings from Last 3 Encounters:  12/06/19 241 lb  (109.3 kg)  10/12/18 243 lb 6.4 oz (110.4 kg)  10/21/17 252 lb 12.8 oz (114.7 kg)     GEN: Moderate obesity.. No acute distress HEENT: Normal NECK: No JVD. LYMPHATICS: No lymphadenopathy CARDIAC:  RRR without murmur, gallop, or edema. VASCULAR:  Normal Pulses. No bruits. RESPIRATORY:  Clear to auscultation without rales, wheezing or rhonchi  ABDOMEN: Soft, non-tender, non-distended, No pulsatile mass, MUSCULOSKELETAL: No deformity  SKIN: Warm and dry NEUROLOGIC:  Alert and oriented  x 3 PSYCHIATRIC:  Normal affect   ASSESSMENT:    1. Coronary artery disease involving native coronary artery of native heart with angina pectoris ([place])   2. Dyslipidemia   3. Essential hypertension   4. Morbid obesity ([place])   5. Educated about COVID-19 virus infection    PLAN:    In order of problems listed above:  1. Continue strong secondary prevention.  I encouraged him to increase physical activity/exercise to help with weight control.  We reviewed sleep, lipid targets, monitoring of glucose tolerance, and blood pressure.  He does not smoke.  Decrease antiplatelet regimen to clopidogrel monotherapy. 2. Continue statin therapy to achieve LDL of 70 or less.  Significant conversation concerning long-term impact of statins.  Now we will continue because of the normal preventive effect and disease with known CAD 3. 130/80 mmHg.  Continue the same medication regimen for control, amlodipine.  Cannot use beta-blocker because of bradycardia. 4. Increase exercise decrease carbohydrates in diet. 5. He has been vaccinated.  He is practicing social distancing.  Overall education and awareness concerning primary/secondary risk prevention was discussed in detail: LDL less than 70, hemoglobin A1c less than 7, blood pressure target less than 130/80 mmHg, >150 minutes of moderate aerobic activity per week, avoidance of smoking, weight control (via diet and exercise), and continued surveillance/management of/for  obstructive sleep apnea.    Medication Adjustments/Labs and Tests Ordered: Current medicines are reviewed at length with the patient today.  Concerns regarding medicines are outlined above.  Orders Placed This Encounter  Procedures  . EKG 12-Lead   No orders of the defined types were placed in this encounter.   Patient Instructions  Medication Instructions:  1) You can stop taking the Aspirin  *If you need a refill on your cardiac medications before your next appointment, please call your pharmacy*   Lab Work: None If you have labs (blood work) drawn today and your tests are completely normal, you will receive your results only by: Marland Kitchen MyChart Message (if you have MyChart) OR . A paper copy in the mail If you have any lab test that is abnormal or we need to change your treatment, we will call you to review the results.   Testing/Procedures: None   Follow-Up: At [facility], you and your health needs are our priority.  As part of our continuing mission to provide you with exceptional heart care, we have created designated Provider Care Teams.  These Care Teams include your primary Cardiologist (physician) and Advanced Practice Providers (APPs -  Physician Assistants and Nurse Practitioners) who all work together to provide you with the care you need, when you need it.  We recommend signing up for the patient portal called "MyChart".  Sign up information is provided on this After Visit Summary.  MyChart is used to connect with patients for Virtual Visits (Telemedicine).  Patients are able to view lab/test results, encounter notes, upcoming appointments, etc.  Non-urgent messages can be sent to your provider as well.   To learn more about what you can do with MyChart, go to [email or web address].    Your next appointment:   1 year(s)  The format for your next appointment:   In Person  Provider:   You may see Sinclair Grooms, MD or one of the following Advanced Practice  Providers on your designated Care Team:    Truitt Merle, NP  Cecilie Kicks, NP  Kathyrn Drown, NP    Other Instructions  Your provider recommends that  you maintain 150 minutes per week of moderate aerobic activity. Your physician discussed the importance of regular exercise and recommended that you start or continue a regular exercise program for good health.     Signed, Sinclair Grooms, MD  12/06/2019 11:44 AM    [place]

## 2019-12-06 ENCOUNTER — Ambulatory Visit (INDEPENDENT_AMBULATORY_CARE_PROVIDER_SITE_OTHER): Payer: BC Managed Care – PPO | Admitting: Interventional Cardiology

## 2019-12-06 ENCOUNTER — Encounter: Payer: Self-pay | Admitting: Interventional Cardiology

## 2019-12-06 ENCOUNTER — Other Ambulatory Visit: Payer: Self-pay

## 2019-12-06 VITALS — BP 142/80 | HR 49 | Ht 71.0 in | Wt 241.0 lb

## 2019-12-06 DIAGNOSIS — E785 Hyperlipidemia, unspecified: Secondary | ICD-10-CM | POA: Diagnosis not present

## 2019-12-06 DIAGNOSIS — I10 Essential (primary) hypertension: Secondary | ICD-10-CM | POA: Diagnosis not present

## 2019-12-06 DIAGNOSIS — Z7189 Other specified counseling: Secondary | ICD-10-CM

## 2019-12-06 DIAGNOSIS — I25119 Atherosclerotic heart disease of native coronary artery with unspecified angina pectoris: Secondary | ICD-10-CM

## 2019-12-06 NOTE — Patient Instructions (Addendum)
Medication Instructions:  1) You can stop taking the Aspirin  *If you need a refill on your cardiac medications before your next appointment, please call your pharmacy*   Lab Work: None If you have labs (blood work) drawn today and your tests are completely normal, you will receive your results only by: [place] [facility] MyChart Message (if you have MyChart) OR . A paper copy in the mail If you have any lab test that is abnormal or we need to change your treatment, we will call you to review the results.   Testing/Procedures: None   Follow-Up: At [facility], you and your health needs are our priority.  As part of our continuing mission to provide you with exceptional heart care, we have created designated Provider Care Teams.  These Care Teams include your primary Cardiologist (physician) and Advanced Practice Providers (APPs -  Physician Assistants and Nurse Practitioners) who all work together to provide you with the care you need, when you need it.  We recommend signing up for the patient portal called "MyChart".  Sign up information is provided on this After Visit Summary.  MyChart is used to connect with patients for Virtual Visits (Telemedicine).  Patients are able to view lab/test results, encounter notes, upcoming appointments, etc.  Non-urgent messages can be sent to your provider as well.   To learn more about what you can do with MyChart, go to [email or web address].    Your next appointment:   1 year(s)  The format for your next appointment:   In Person  Provider:   You may see Sinclair Grooms, MD or one of the following Advanced Practice Providers on your designated Care Team:    Truitt Merle, NP  Cecilie Kicks, NP  Kathyrn Drown, NP    Other Instructions  Your provider recommends that you maintain 150 minutes per week of moderate aerobic activity. Your physician discussed the importance of regular exercise and recommended that you start or continue a regular exercise  program for good health.

## 2019-12-15 ENCOUNTER — Other Ambulatory Visit: Payer: Self-pay | Admitting: Interventional Cardiology

## 2019-12-25 DIAGNOSIS — M5432 Sciatica, left side: Secondary | ICD-10-CM | POA: Diagnosis not present

## 2019-12-25 DIAGNOSIS — M9903 Segmental and somatic dysfunction of lumbar region: Secondary | ICD-10-CM | POA: Diagnosis not present

## 2019-12-26 DIAGNOSIS — M5432 Sciatica, left side: Secondary | ICD-10-CM | POA: Diagnosis not present

## 2019-12-26 DIAGNOSIS — M9903 Segmental and somatic dysfunction of lumbar region: Secondary | ICD-10-CM | POA: Diagnosis not present

## 2019-12-27 DIAGNOSIS — M9903 Segmental and somatic dysfunction of lumbar region: Secondary | ICD-10-CM | POA: Diagnosis not present

## 2019-12-27 DIAGNOSIS — M5432 Sciatica, left side: Secondary | ICD-10-CM | POA: Diagnosis not present

## 2019-12-31 DIAGNOSIS — M9903 Segmental and somatic dysfunction of lumbar region: Secondary | ICD-10-CM | POA: Diagnosis not present

## 2019-12-31 DIAGNOSIS — M5432 Sciatica, left side: Secondary | ICD-10-CM | POA: Diagnosis not present

## 2020-01-01 DIAGNOSIS — M9903 Segmental and somatic dysfunction of lumbar region: Secondary | ICD-10-CM | POA: Diagnosis not present

## 2020-01-01 DIAGNOSIS — M5432 Sciatica, left side: Secondary | ICD-10-CM | POA: Diagnosis not present

## 2020-01-02 DIAGNOSIS — I10 Essential (primary) hypertension: Secondary | ICD-10-CM | POA: Diagnosis not present

## 2020-01-02 DIAGNOSIS — M9903 Segmental and somatic dysfunction of lumbar region: Secondary | ICD-10-CM | POA: Diagnosis not present

## 2020-01-02 DIAGNOSIS — M5432 Sciatica, left side: Secondary | ICD-10-CM | POA: Diagnosis not present

## 2020-01-02 DIAGNOSIS — N182 Chronic kidney disease, stage 2 (mild): Secondary | ICD-10-CM | POA: Diagnosis not present

## 2020-01-02 DIAGNOSIS — I2510 Atherosclerotic heart disease of native coronary artery without angina pectoris: Secondary | ICD-10-CM | POA: Diagnosis not present

## 2020-01-02 DIAGNOSIS — Z131 Encounter for screening for diabetes mellitus: Secondary | ICD-10-CM | POA: Diagnosis not present

## 2020-01-02 DIAGNOSIS — Z0000 Encounter for general adult medical examination without abnormal findings: Secondary | ICD-10-CM | POA: Diagnosis not present

## 2020-01-02 DIAGNOSIS — Z125 Encounter for screening for malignant neoplasm of prostate: Secondary | ICD-10-CM | POA: Diagnosis not present

## 2020-01-02 DIAGNOSIS — E782 Mixed hyperlipidemia: Secondary | ICD-10-CM | POA: Diagnosis not present

## 2020-01-02 DIAGNOSIS — Z23 Encounter for immunization: Secondary | ICD-10-CM | POA: Diagnosis not present

## 2020-01-03 DIAGNOSIS — M5432 Sciatica, left side: Secondary | ICD-10-CM | POA: Diagnosis not present

## 2020-01-03 DIAGNOSIS — M9903 Segmental and somatic dysfunction of lumbar region: Secondary | ICD-10-CM | POA: Diagnosis not present

## 2020-01-04 ENCOUNTER — Telehealth: Payer: Self-pay

## 2020-01-04 NOTE — Telephone Encounter (Signed)
NOTES ON FILE FROM [facility] [phone], SENT REFERRAL TO SCHEDULING 

## 2020-01-04 NOTE — Telephone Encounter (Signed)
NOT NOTE LABS PUT IN DR Richoux'S BOX

## 2020-01-07 DIAGNOSIS — M5432 Sciatica, left side: Secondary | ICD-10-CM | POA: Diagnosis not present

## 2020-01-07 DIAGNOSIS — M9903 Segmental and somatic dysfunction of lumbar region: Secondary | ICD-10-CM | POA: Diagnosis not present

## 2020-01-08 DIAGNOSIS — M5432 Sciatica, left side: Secondary | ICD-10-CM | POA: Diagnosis not present

## 2020-01-08 DIAGNOSIS — M9903 Segmental and somatic dysfunction of lumbar region: Secondary | ICD-10-CM | POA: Diagnosis not present

## 2020-01-10 DIAGNOSIS — M5432 Sciatica, left side: Secondary | ICD-10-CM | POA: Diagnosis not present

## 2020-01-10 DIAGNOSIS — M9903 Segmental and somatic dysfunction of lumbar region: Secondary | ICD-10-CM | POA: Diagnosis not present

## 2020-01-14 DIAGNOSIS — M5432 Sciatica, left side: Secondary | ICD-10-CM | POA: Diagnosis not present

## 2020-01-14 DIAGNOSIS — M9903 Segmental and somatic dysfunction of lumbar region: Secondary | ICD-10-CM | POA: Diagnosis not present

## 2020-01-15 DIAGNOSIS — M9903 Segmental and somatic dysfunction of lumbar region: Secondary | ICD-10-CM | POA: Diagnosis not present

## 2020-01-15 DIAGNOSIS — M5432 Sciatica, left side: Secondary | ICD-10-CM | POA: Diagnosis not present

## 2020-01-17 DIAGNOSIS — M5432 Sciatica, left side: Secondary | ICD-10-CM | POA: Diagnosis not present

## 2020-01-17 DIAGNOSIS — M9903 Segmental and somatic dysfunction of lumbar region: Secondary | ICD-10-CM | POA: Diagnosis not present

## 2020-01-22 DIAGNOSIS — M5432 Sciatica, left side: Secondary | ICD-10-CM | POA: Diagnosis not present

## 2020-01-22 DIAGNOSIS — M9903 Segmental and somatic dysfunction of lumbar region: Secondary | ICD-10-CM | POA: Diagnosis not present

## 2020-01-24 DIAGNOSIS — M9903 Segmental and somatic dysfunction of lumbar region: Secondary | ICD-10-CM | POA: Diagnosis not present

## 2020-01-24 DIAGNOSIS — M5432 Sciatica, left side: Secondary | ICD-10-CM | POA: Diagnosis not present

## 2020-01-29 DIAGNOSIS — M5432 Sciatica, left side: Secondary | ICD-10-CM | POA: Diagnosis not present

## 2020-01-29 DIAGNOSIS — M9903 Segmental and somatic dysfunction of lumbar region: Secondary | ICD-10-CM | POA: Diagnosis not present

## 2020-07-30 DIAGNOSIS — Z23 Encounter for immunization: Secondary | ICD-10-CM | POA: Diagnosis not present

## 2020-07-30 DIAGNOSIS — I2510 Atherosclerotic heart disease of native coronary artery without angina pectoris: Secondary | ICD-10-CM | POA: Diagnosis not present

## 2020-07-30 DIAGNOSIS — I10 Essential (primary) hypertension: Secondary | ICD-10-CM | POA: Diagnosis not present

## 2020-07-30 DIAGNOSIS — E7800 Pure hypercholesterolemia, unspecified: Secondary | ICD-10-CM | POA: Diagnosis not present

## 2020-07-30 DIAGNOSIS — N529 Male erectile dysfunction, unspecified: Secondary | ICD-10-CM | POA: Diagnosis not present

## 2020-08-12 ENCOUNTER — Other Ambulatory Visit: Payer: Self-pay | Admitting: Interventional Cardiology

## 2020-10-09 ENCOUNTER — Other Ambulatory Visit: Payer: Self-pay | Admitting: Interventional Cardiology

## 2020-10-30 ENCOUNTER — Other Ambulatory Visit: Payer: Self-pay | Admitting: Interventional Cardiology

## 2020-10-30 NOTE — Telephone Encounter (Signed)
Called pharmacy and spoke with pharmacist who stated the patient has two refills on file a we do not need to send over any additional refills.  Rx request will be denied.

## 2020-11-11 ENCOUNTER — Other Ambulatory Visit: Payer: Self-pay | Admitting: Interventional Cardiology

## 2020-11-20 DIAGNOSIS — H43813 Vitreous degeneration, bilateral: Secondary | ICD-10-CM | POA: Diagnosis not present

## 2020-11-20 DIAGNOSIS — H1045 Other chronic allergic conjunctivitis: Secondary | ICD-10-CM | POA: Diagnosis not present

## 2020-11-20 DIAGNOSIS — H43393 Other vitreous opacities, bilateral: Secondary | ICD-10-CM | POA: Diagnosis not present

## 2020-11-20 DIAGNOSIS — H25813 Combined forms of age-related cataract, bilateral: Secondary | ICD-10-CM | POA: Diagnosis not present

## 2020-11-21 ENCOUNTER — Other Ambulatory Visit: Payer: Self-pay | Admitting: Interventional Cardiology

## 2021-01-02 DIAGNOSIS — Z0000 Encounter for general adult medical examination without abnormal findings: Secondary | ICD-10-CM | POA: Diagnosis not present

## 2021-01-02 DIAGNOSIS — E782 Mixed hyperlipidemia: Secondary | ICD-10-CM | POA: Diagnosis not present

## 2021-01-02 DIAGNOSIS — Z5181 Encounter for therapeutic drug level monitoring: Secondary | ICD-10-CM | POA: Diagnosis not present

## 2021-01-02 DIAGNOSIS — I10 Essential (primary) hypertension: Secondary | ICD-10-CM | POA: Diagnosis not present

## 2021-01-02 DIAGNOSIS — Z125 Encounter for screening for malignant neoplasm of prostate: Secondary | ICD-10-CM | POA: Diagnosis not present

## 2021-01-26 NOTE — Progress Notes (Signed)
Cardiology Office Note:    Date:  01/27/2021   ID:  Jeffrey [name], DOB [birth year], MRN [id number]  PCP:  Wenda Low, MD  Cardiologist:  Sinclair Grooms, MD   Referring MD: Wenda Low, MD   Chief Complaint  Patient presents with   Coronary Artery Disease   Hypertension     History of Present Illness:    Jeffrey [name] is a 65 y.o. male with a hx of CAD with NSTEMI leading to PCI 02/26/16 to LCX,  85% OM1, 50% LAD, 50% RCA and normal EF, obesity, Htn, anf hyperlipidemia.  Jeffrey [name] is doing well.  He denies angina.  He is playing golf on a regular basis.  He gets Jeffrey [name] plays golf 5 times a week.  He has not been as consistent with longer duration aerobic activities as he was prior.  He denies symptoms of angina or anginal equivalent.  Past Medical History:  Diagnosis Date   Chest wall abscess 2012   "lanced it & it's gone"   Elevated cholesterol    GERD (gastroesophageal reflux disease)     Past Surgical History:  Procedure Laterality Date   CARDIAC CATHETERIZATION N/A 02/26/2016   Procedure: Left Heart Cath and Coronary Angiography;  Surgeon: Belva Crome, MD;  Location: [place] [facility];  Service: Cardiovascular;  Laterality: N/A;   CARDIAC CATHETERIZATION N/A 02/26/2016   Procedure: Coronary Stent Intervention;  Surgeon: Belva Crome, MD;  Location: [place] [facility];  Service: Cardiovascular;  Laterality: N/A;   COLONOSCOPY  2007   COLONOSCOPY WITH PROPOFOL N/A 02/10/2016   Procedure: COLONOSCOPY WITH PROPOFOL;  Surgeon: Garlan Fair, MD;  Location: [facility];  Service: Endoscopy;  Laterality: N/A;   CORONARY ANGIOPLASTY WITH STENT PLACEMENT  02/26/2016    Current Medications: Current Meds  Medication Sig   amLODipine (NORVASC) 5 MG tablet TAKE 1 TABLET BY MOUTH EVERY DAY   atorvastatin (LIPITOR) 80 MG tablet TAKE ONE TABLET BY MOUTH DAILY AT 6 PM. Please keep upcoming appt in September 2022 with Dr. Tamala Julian before anymore refills. Thank you    clopidogrel (PLAVIX) 75 MG tablet TAKE 1 TABLET BY MOUTH EVERY DAY   Multiple Vitamin (MULTIVITAMIN WITH MINERALS) TABS tablet Take 1 tablet by mouth daily.   nitroGLYCERIN (NITROSTAT) 0.4 MG SL tablet DISSOLVE ONE TABLET UNDER THE TONGUE EVERY 5 MINUTES AS NEEDED FOR CHEST PAIN.   sildenafil (REVATIO) 20 MG tablet Take 40 mg by mouth daily as needed.     Allergies:   Brilinta [ticagrelor]   Social History   Socioeconomic History   Marital status: Married    Spouse name: Not on file   Number of children: Not on file   Years of education: Not on file   Highest education level: Not on file  Occupational History   Not on file  Tobacco Use   Smoking status: Former    Types: Cigars    Quit date: 01/16/2016    Years since quitting: 5.0   Smokeless tobacco: Never   Tobacco comments:    02/26/2016 "might smoke 1/month"  Vaping Use   Vaping Use: Never used  Substance and Sexual Activity   Alcohol use: Yes    Alcohol/week: 18.0 standard drinks    Types: 18 Cans of beer per week   Drug use: No   Sexual activity: Not on file  Other Topics Concern   Not on file  Social History Narrative   Not on file   Social Determinants of Health  Financial Resource Strain: Not on file  Food Insecurity: Not on file  Transportation Needs: Not on file  Physical Activity: Not on file  Stress: Not on file  Social Connections: Not on file     Family History: The patient's family history includes Hypertension in his sister; Stroke in his brother.  ROS:   Please see the history of present illness.    Denies claudication and peripheral edema.  Sleeping well.  Denies snoring.  No medication side effects.  All other systems reviewed and are negative.  EKGs/Labs/Other Studies Reviewed:    The following studies were reviewed today: No recent imaging.  He will have exercise nuclear study done and comparison will be made with the study performed in 2017.  EKG:  EKG normal sinus rhythm, left axis  deviation, normal PR interval.  When compared to the prior tracing from 12/06/2019, no significant changes noted except heart rate is now faster than previous..  Recent Labs: No results found for requested labs within last 8760 hours.  Recent Lipid Panel    Component Value Date/Time   CHOL 111 10/25/2018 0833   TRIG 91 10/25/2018 0833   HDL 39 (L) 10/25/2018 0833   CHOLHDL 2.8 10/25/2018 0833   LDLCALC 54 10/25/2018 0833    Physical Exam:    VS:  BP 120/62   Pulse 63   Ht '5\' 11"'$  (1.803 m)   Wt 242 lb 9.6 oz (110 kg)   SpO2 95%   BMI 33.84 kg/m     Wt Readings from Last 3 Encounters:  01/27/21 242 lb 9.6 oz (110 kg)  12/06/19 241 lb (109.3 kg)  10/12/18 243 lb 6.4 oz (110.4 kg)     GEN: Overweight. No acute distress HEENT: Normal NECK: No JVD. LYMPHATICS: No lymphadenopathy CARDIAC: Apical systolic mitral regurgitation murmur. RRR no gallop, or edema. VASCULAR:  Normal Pulses. No bruits. RESPIRATORY:  Clear to auscultation without rales, wheezing or rhonchi  ABDOMEN: Soft, non-tender, non-distended, No pulsatile mass, MUSCULOSKELETAL: No deformity  SKIN: Warm and dry NEUROLOGIC:  Alert and oriented x 3 PSYCHIATRIC:  Normal affect   ASSESSMENT:    1. Coronary artery disease involving native coronary artery of native heart with angina pectoris ([place])   2. Dyslipidemia   3. Essential hypertension   4. Morbid obesity ([place])   5. Systolic murmur    PLAN:    In order of problems listed above:  Needs myocardial perfusion imaging.  A stress myocardial perfusion study will be performed. Continue high intensity Lipitor 80 mg/day. Continue Norvasc 5 mg/day.  Salt restriction, weight loss, and exercise encouraged. We discussed weight reduction Systolic murmur is compatible with mitral regurgitation probably related to his prior inferior wall infarction.  Continue to follow clinically for the time being.  If EF is low on nuclear study, may need to perform an  echocardiogram.   Overall education and awareness concerning primary/secondary risk prevention was discussed in detail: LDL less than 70, hemoglobin A1c less than 7, blood pressure target less than 130/80 mmHg, >150 minutes of moderate aerobic activity per week, avoidance of smoking, weight control (via diet and exercise), and continued surveillance/management of/for obstructive sleep apnea.    Medication Adjustments/Labs and Tests Ordered: Current medicines are reviewed at length with the patient today.  Concerns regarding medicines are outlined above.  Orders Placed This Encounter  Procedures   MYOCARDIAL PERFUSION IMAGING   EKG 12-Lead    No orders of the defined types were placed in this encounter.  Patient Instructions  Medication Instructions:  Your physician recommends that you continue on your current medications as directed. Please refer to the Current Medication list given to you today.  *If you need a refill on your cardiac medications before your next appointment, please call your pharmacy*   Lab Work: None If you have labs (blood work) drawn today and your tests are completely normal, you will receive your results only by: [place] (if you have MyChart) OR A paper copy in the mail If you have any lab test that is abnormal or we need to change your treatment, we will call you to review the results.   Testing/Procedures: Your physician has requested that you have an exercise stress myoview. For further information please visit [email or web address]. Please follow instruction sheet, as given.   Follow-Up: At [facility], you and your health needs are our priority.  As part of our continuing mission to provide you with exceptional heart care, we have created designated Provider Care Teams.  These Care Teams include your primary Cardiologist (physician) and Advanced Practice Providers (APPs -  Physician Assistants and Nurse Practitioners) who all work together  to provide you with the care you need, when you need it.  We recommend signing up for the patient portal called "MyChart".  Sign up information is provided on this After Visit Summary.  MyChart is used to connect with patients for Virtual Visits (Telemedicine).  Patients are able to view lab/test results, encounter notes, upcoming appointments, etc.  Non-urgent messages can be sent to your provider as well.   To learn more about what you can do with MyChart, go to [email or web address].    Your next appointment:   1 year(s)  The format for your next appointment:   In Person  Provider:   You may see Sinclair Grooms, MD or one of the following Advanced Practice Providers on your designated Care Team:   Cecilie Kicks, NP   Other Instructions     Signed, Sinclair Grooms, MD  01/27/2021 4:18 PM    [place]

## 2021-01-27 ENCOUNTER — Ambulatory Visit (INDEPENDENT_AMBULATORY_CARE_PROVIDER_SITE_OTHER): Payer: BC Managed Care – PPO | Admitting: Interventional Cardiology

## 2021-01-27 ENCOUNTER — Encounter: Payer: Self-pay | Admitting: Interventional Cardiology

## 2021-01-27 ENCOUNTER — Other Ambulatory Visit: Payer: Self-pay

## 2021-01-27 ENCOUNTER — Encounter: Payer: Self-pay | Admitting: *Deleted

## 2021-01-27 VITALS — BP 120/62 | HR 63 | Ht 71.0 in | Wt 242.6 lb

## 2021-01-27 DIAGNOSIS — I25119 Atherosclerotic heart disease of native coronary artery with unspecified angina pectoris: Secondary | ICD-10-CM | POA: Diagnosis not present

## 2021-01-27 DIAGNOSIS — E785 Hyperlipidemia, unspecified: Secondary | ICD-10-CM

## 2021-01-27 DIAGNOSIS — I10 Essential (primary) hypertension: Secondary | ICD-10-CM | POA: Diagnosis not present

## 2021-01-27 DIAGNOSIS — R011 Cardiac murmur, unspecified: Secondary | ICD-10-CM

## 2021-01-27 NOTE — Patient Instructions (Signed)
Medication Instructions:  Your physician recommends that you continue on your current medications as directed. Please refer to the Current Medication list given to you today.  *If you need a refill on your cardiac medications before your next appointment, please call your pharmacy*   Lab Work: None If you have labs (blood work) drawn today and your tests are completely normal, you will receive your results only by: [place] (if you have MyChart) OR A paper copy in the mail If you have any lab test that is abnormal or we need to change your treatment, we will call you to review the results.   Testing/Procedures: Your physician has requested that you have an exercise stress myoview. For further information please visit [email or web address]. Please follow instruction sheet, as given.   Follow-Up: At [facility], you and your health needs are our priority.  As part of our continuing mission to provide you with exceptional heart care, we have created designated Provider Care Teams.  These Care Teams include your primary Cardiologist (physician) and Advanced Practice Providers (APPs -  Physician Assistants and Nurse Practitioners) who all work together to provide you with the care you need, when you need it.  We recommend signing up for the patient portal called "MyChart".  Sign up information is provided on this After Visit Summary.  MyChart is used to connect with patients for Virtual Visits (Telemedicine).  Patients are able to view lab/test results, encounter notes, upcoming appointments, etc.  Non-urgent messages can be sent to your provider as well.   To learn more about what you can do with MyChart, go to [email or web address].    Your next appointment:   1 year(s)  The format for your next appointment:   In Person  Provider:   You may see Sinclair Grooms, MD or one of the following Advanced Practice Providers on your designated Care Team:   Cecilie Kicks, NP   Other  Instructions

## 2021-01-28 NOTE — Addendum Note (Signed)
Addended by: Loren Racer on: 01/28/2021 11:18 AM   Modules accepted: Orders

## 2021-01-29 ENCOUNTER — Telehealth (HOSPITAL_COMMUNITY): Payer: Self-pay | Admitting: *Deleted

## 2021-01-29 NOTE — Telephone Encounter (Signed)
Patient given detailed instructions per Myocardial Perfusion Study Information Sheet for the test on 02/04/21 Patient notified to arrive 15 minutes early and that it is imperative to arrive on time for appointment to keep from having the test rescheduled.  If you need to cancel or reschedule your appointment, please call the office within 24 hours of your appointment. . Patient verbalized understanding. Kirstie Peri

## 2021-01-29 NOTE — Addendum Note (Signed)
Addended by: Belva Crome on: 01/29/2021 10:05 AM   Modules accepted: Orders

## 2021-02-04 ENCOUNTER — Other Ambulatory Visit: Payer: Self-pay

## 2021-02-04 ENCOUNTER — Ambulatory Visit (HOSPITAL_COMMUNITY): Payer: BC Managed Care – PPO | Attending: Cardiovascular Disease

## 2021-02-04 DIAGNOSIS — I25119 Atherosclerotic heart disease of native coronary artery with unspecified angina pectoris: Secondary | ICD-10-CM | POA: Diagnosis not present

## 2021-02-04 LAB — MYOCARDIAL PERFUSION IMAGING
Base ST Depression (mm): 0 mm
LV dias vol: 116 mL (ref 62–150)
LV sys vol: 52 mL
Nuc Stress EF: 55 %
Rest Nuclear Isotope Dose: 11 mCi
SDS: 1
SRS: 2
SSS: 3
ST Depression (mm): 0 mm
Stress Nuclear Isotope Dose: 32.7 mCi
TID: 0.89

## 2021-02-04 MED ORDER — TECHNETIUM TC 99M TETROFOSMIN IV KIT
10.7000 | PACK | Freq: Once | INTRAVENOUS | Status: AC | PRN
  Administered 2021-02-04: 10.7 via INTRAVENOUS
  Filled 2021-02-04: qty 11

## 2021-02-04 MED ORDER — TECHNETIUM TC 99M TETROFOSMIN IV KIT
29.6000 | PACK | Freq: Once | INTRAVENOUS | Status: AC | PRN
  Administered 2021-02-04: 29.6 via INTRAVENOUS
  Filled 2021-02-04: qty 30

## 2021-11-26 DIAGNOSIS — H43813 Vitreous degeneration, bilateral: Secondary | ICD-10-CM | POA: Diagnosis not present

## 2021-11-26 DIAGNOSIS — H25813 Combined forms of age-related cataract, bilateral: Secondary | ICD-10-CM | POA: Diagnosis not present

## 2021-11-26 DIAGNOSIS — H43393 Other vitreous opacities, bilateral: Secondary | ICD-10-CM | POA: Diagnosis not present

## 2021-11-26 DIAGNOSIS — H1045 Other chronic allergic conjunctivitis: Secondary | ICD-10-CM | POA: Diagnosis not present

## 2022-01-21 DIAGNOSIS — E782 Mixed hyperlipidemia: Secondary | ICD-10-CM | POA: Diagnosis not present

## 2022-01-21 DIAGNOSIS — N182 Chronic kidney disease, stage 2 (mild): Secondary | ICD-10-CM | POA: Diagnosis not present

## 2022-01-21 DIAGNOSIS — Z5181 Encounter for therapeutic drug level monitoring: Secondary | ICD-10-CM | POA: Diagnosis not present

## 2022-01-21 DIAGNOSIS — I10 Essential (primary) hypertension: Secondary | ICD-10-CM | POA: Diagnosis not present

## 2022-01-21 DIAGNOSIS — Z0000 Encounter for general adult medical examination without abnormal findings: Secondary | ICD-10-CM | POA: Diagnosis not present

## 2022-03-20 NOTE — Progress Notes (Unsigned)
Cardiology Office Note:    Date:  03/22/2022   ID:  Jeffrey [name], DOB 12-10-1955, MRN [id number]  PCP:  Wenda Low, MD  Cardiologist:  Sinclair Grooms, MD   Referring MD: Wenda Low, MD   Chief Complaint  Patient presents with   Coronary Artery Disease   Congestive Heart Failure   Hyperlipidemia   Hypertension    History of Present Illness:    Jeffrey [name] is a 66 y.o. male with a hx of of CAD with NSTEMI leading to PCI 02/26/16 to LCX,  85% OM1, 50% LAD, 50% RCA and normal EF, obesity, Htn, anf hyperlipidemia.    Jeffrey [name] is doing well.  He gets ample physical activity, he plays golf a lot, and has no exertional intolerance.  He has not needed sublingual nitroglycerin.  Denies orthopnea, PND, and palpitations.  Past Medical History:  Diagnosis Date   Chest wall abscess 2012   "lanced it & it's gone"   Elevated cholesterol    GERD (gastroesophageal reflux disease)     Past Surgical History:  Procedure Laterality Date   CARDIAC CATHETERIZATION N/A 02/26/2016   Procedure: Left Heart Cath and Coronary Angiography;  Surgeon: Belva Crome, MD;  Location: [place] [facility];  Service: Cardiovascular;  Laterality: N/A;   CARDIAC CATHETERIZATION N/A 02/26/2016   Procedure: Coronary Stent Intervention;  Surgeon: Belva Crome, MD;  Location: [place] [facility];  Service: Cardiovascular;  Laterality: N/A;   COLONOSCOPY  2007   COLONOSCOPY WITH PROPOFOL N/A 02/10/2016   Procedure: COLONOSCOPY WITH PROPOFOL;  Surgeon: Garlan Fair, MD;  Location: [facility];  Service: Endoscopy;  Laterality: N/A;   CORONARY ANGIOPLASTY WITH STENT PLACEMENT  02/26/2016    Current Medications: Current Meds  Medication Sig   amLODipine (NORVASC) 5 MG tablet TAKE 1 TABLET BY MOUTH EVERY DAY   atorvastatin (LIPITOR) 80 MG tablet TAKE ONE TABLET BY MOUTH DAILY AT 6 PM. Please keep upcoming appt in September 2022 with Dr. Tamala Julian before anymore refills. Thank you   clopidogrel (PLAVIX)  75 MG tablet TAKE 1 TABLET BY MOUTH EVERY DAY   Multiple Vitamin (MULTIVITAMIN WITH MINERALS) TABS tablet Take 1 tablet by mouth daily.   sildenafil (REVATIO) 20 MG tablet Take 40 mg by mouth daily as needed.   [DISCONTINUED] nitroGLYCERIN (NITROSTAT) 0.4 MG SL tablet DISSOLVE ONE TABLET UNDER THE TONGUE EVERY 5 MINUTES AS NEEDED FOR CHEST PAIN.     Allergies:   Brilinta [ticagrelor]   Social History   Socioeconomic History   Marital status: Married    Spouse name: Not on file   Number of children: Not on file   Years of education: Not on file   Highest education level: Not on file  Occupational History   Not on file  Tobacco Use   Smoking status: Former    Types: Cigars    Quit date: 01/16/2016    Years since quitting: 6.1   Smokeless tobacco: Never   Tobacco comments:    02/26/2016 "might smoke 1/month"  Vaping Use   Vaping Use: Never used  Substance and Sexual Activity   Alcohol use: Yes    Alcohol/week: 18.0 standard drinks of alcohol    Types: 18 Cans of beer per week   Drug use: No   Sexual activity: Not on file  Other Topics Concern   Not on file  Social History Narrative   Not on file   Social Determinants of Health   Financial Resource Strain:  Not on file  Food Insecurity: Not on file  Transportation Needs: Not on file  Physical Activity: Not on file  Stress: Not on file  Social Connections: Not on file     Family History: The patient's family history includes Hypertension in his sister; Stroke in his brother.  ROS:   Please see the history of present illness.    Overall doing well.  Still working in his Human resources officer, Psychologist, prison and probation services.  All other systems reviewed and are negative.  EKGs/Labs/Other Studies Reviewed:    The following studies were reviewed today:  Myocardial Perfusion imaging 2022: Study Highlights    Findings are consistent with prior myocardial infarction with peri-infarct ischemia. The study is low risk.   No ST deviation was  noted.   LV perfusion is abnormal. Defect 1: There is a medium defect with mild reduction in uptake present in the apical to basal inferolateral location(s) that is partially reversible. There is normal wall motion in the defect area. Consistent with infarction and peri-infarct ischemia.   Left ventricular function is normal. Nuclear stress EF: 55 %. The left ventricular ejection fraction is normal (55-65%). End diastolic cavity size is normal. End systolic cavity size is normal.   Prior study available for comparison from 02/19/2016. There are changes compared to prior study which appear to be improved. The left ventricular ejection fraction has increased. Segmental wall-motion abnormalities have improved.   Compared with the nuclear stress in 1610, systolic function and perfusion have improved.  There is evidence of a small prior infarct with mild peri-infarct ischemia.  EKG:  EKG deviation, normal sinus rhythm/sinus bradycardia.  Recent Labs: No results found for requested labs within last 365 days.  Recent Lipid Panel    Component Value Date/Time   CHOL 111 10/25/2018 0833   TRIG 91 10/25/2018 0833   HDL 39 (L) 10/25/2018 0833   CHOLHDL 2.8 10/25/2018 0833   LDLCALC 54 10/25/2018 0833    Physical Exam:    VS:  BP 134/68   Pulse (!) 54   Ht '5\' 11"'$  (1.803 m)   Wt 249 lb 3.2 oz (113 kg)   SpO2 96%   BMI 34.76 kg/m     Wt Readings from Last 3 Encounters:  03/22/22 249 lb 3.2 oz (113 kg)  02/04/21 242 lb (109.8 kg)  01/27/21 242 lb 9.6 oz (110 kg)     GEN: Obese. No acute distress HEENT: Normal NECK: No JVD. LYMPHATICS: No lymphadenopathy CARDIAC: 1/6 systolic left mid sternal murmur. RRR no gallop, or edema. VASCULAR:  Normal Pulses. No bruits. RESPIRATORY:  Clear to auscultation without rales, wheezing or rhonchi  ABDOMEN: Soft, non-tender, non-distended, No pulsatile mass, MUSCULOSKELETAL: No deformity  SKIN: Warm and dry NEUROLOGIC:  Alert and oriented x  3 PSYCHIATRIC:  Normal affect   ASSESSMENT:    1. Coronary artery disease involving native coronary artery of native heart with angina pectoris ([place])   2. Dyslipidemia   3. Essential hypertension   4. Morbid obesity ([place])    PLAN:    In order of problems listed above:  Doing well with out angina.  Secondary prevention discussed. Continue high intensity statin therapy.  Most recent LDL was 70. Excellent blood pressure control on Norvasc 5 mg/day. Needs to lose weight.  Needs to exercise.  Overall education and awareness concerning primary/secondary risk prevention was discussed in detail: LDL less than 70, hemoglobin A1c less than 7, blood pressure target less than 130/80 mmHg, >150 minutes of moderate aerobic activity per  week, avoidance of smoking, weight control (via diet and exercise), and continued surveillance/management of/for obstructive sleep apnea.  Dr. Irish Lack, 1 year.  Medication Adjustments/Labs and Tests Ordered: Current medicines are reviewed at length with the patient today.  Concerns regarding medicines are outlined above.  Orders Placed This Encounter  Procedures   EKG 12-Lead   Meds ordered this encounter  Medications   nitroGLYCERIN (NITROSTAT) 0.4 MG SL tablet    Sig: Place 1 tablet (0.4 mg total) under the tongue every 5 (five) minutes as needed for chest pain.    Dispense:  25 tablet    Refill:  5    Patient Instructions  Medication Instructions:  Your physician recommends that you continue on your current medications as directed. Please refer to the Current Medication list given to you today.  *If you need a refill on your cardiac medications before your next appointment, please call your pharmacy*  Lab Work: NONE  Testing/Procedures: NONE  Follow-Up: At [facility], you and your health needs are our priority.  As part of our continuing mission to provide you with exceptional heart care, we have created designated Provider Care  Teams.  These Care Teams include your primary Cardiologist (physician) and Advanced Practice Providers (APPs -  Physician Assistants and Nurse Practitioners) who all work together to provide you with the care you need, when you need it.  Your next appointment:   1 year(s)  The format for your next appointment:   In Person  Provider:   Larae Grooms, MD  Important Information About Sugar         Signed, Sinclair Grooms, MD  03/22/2022 3:21 PM    [place]

## 2022-03-22 ENCOUNTER — Encounter: Payer: Self-pay | Admitting: Interventional Cardiology

## 2022-03-22 ENCOUNTER — Ambulatory Visit: Payer: BC Managed Care – PPO | Attending: Interventional Cardiology | Admitting: Interventional Cardiology

## 2022-03-22 VITALS — BP 134/68 | HR 54 | Ht 71.0 in | Wt 249.2 lb

## 2022-03-22 DIAGNOSIS — E785 Hyperlipidemia, unspecified: Secondary | ICD-10-CM

## 2022-03-22 DIAGNOSIS — I10 Essential (primary) hypertension: Secondary | ICD-10-CM | POA: Diagnosis not present

## 2022-03-22 DIAGNOSIS — I25119 Atherosclerotic heart disease of native coronary artery with unspecified angina pectoris: Secondary | ICD-10-CM

## 2022-03-22 DIAGNOSIS — R011 Cardiac murmur, unspecified: Secondary | ICD-10-CM

## 2022-03-22 MED ORDER — NITROGLYCERIN 0.4 MG SL SUBL: 0.4000 mg | SUBLINGUAL_TABLET | SUBLINGUAL | 5 refills | Status: DC | PRN

## 2022-03-22 NOTE — Patient Instructions (Signed)
Medication Instructions:  Your physician recommends that you continue on your current medications as directed. Please refer to the Current Medication list given to you today.  *If you need a refill on your cardiac medications before your next appointment, please call your pharmacy*  Lab Work: NONE  Testing/Procedures: NONE  Follow-Up: At [facility], you and your health needs are our priority.  As part of our continuing mission to provide you with exceptional heart care, we have created designated Provider Care Teams.  These Care Teams include your primary Cardiologist (physician) and Advanced Practice Providers (APPs -  Physician Assistants and Nurse Practitioners) who all work together to provide you with the care you need, when you need it.  Your next appointment:   1 year(s)  The format for your next appointment:   In Person  Provider:   Larae Grooms, MD  Important Information About [place]

## 2022-06-21 DIAGNOSIS — H903 Sensorineural hearing loss, bilateral: Secondary | ICD-10-CM | POA: Diagnosis not present

## 2022-06-21 DIAGNOSIS — H9313 Tinnitus, bilateral: Secondary | ICD-10-CM | POA: Diagnosis not present

## 2022-07-27 DIAGNOSIS — I10 Essential (primary) hypertension: Secondary | ICD-10-CM | POA: Diagnosis not present

## 2022-07-27 DIAGNOSIS — I2510 Atherosclerotic heart disease of native coronary artery without angina pectoris: Secondary | ICD-10-CM | POA: Diagnosis not present

## 2022-07-27 DIAGNOSIS — E782 Mixed hyperlipidemia: Secondary | ICD-10-CM | POA: Diagnosis not present

## 2022-07-27 DIAGNOSIS — N182 Chronic kidney disease, stage 2 (mild): Secondary | ICD-10-CM | POA: Diagnosis not present

## 2022-11-30 DIAGNOSIS — H25813 Combined forms of age-related cataract, bilateral: Secondary | ICD-10-CM | POA: Diagnosis not present

## 2022-11-30 DIAGNOSIS — H43393 Other vitreous opacities, bilateral: Secondary | ICD-10-CM | POA: Diagnosis not present

## 2022-11-30 DIAGNOSIS — H43813 Vitreous degeneration, bilateral: Secondary | ICD-10-CM | POA: Diagnosis not present

## 2022-11-30 DIAGNOSIS — H1045 Other chronic allergic conjunctivitis: Secondary | ICD-10-CM | POA: Diagnosis not present

## 2022-12-01 DIAGNOSIS — M25561 Pain in right knee: Secondary | ICD-10-CM | POA: Diagnosis not present

## 2022-12-01 DIAGNOSIS — M25461 Effusion, right knee: Secondary | ICD-10-CM | POA: Diagnosis not present

## 2023-02-03 DIAGNOSIS — N182 Chronic kidney disease, stage 2 (mild): Secondary | ICD-10-CM | POA: Diagnosis not present

## 2023-02-03 DIAGNOSIS — E7800 Pure hypercholesterolemia, unspecified: Secondary | ICD-10-CM | POA: Diagnosis not present

## 2023-02-03 DIAGNOSIS — Z125 Encounter for screening for malignant neoplasm of prostate: Secondary | ICD-10-CM | POA: Diagnosis not present

## 2023-02-03 DIAGNOSIS — Z23 Encounter for immunization: Secondary | ICD-10-CM | POA: Diagnosis not present

## 2023-02-03 DIAGNOSIS — Z0000 Encounter for general adult medical examination without abnormal findings: Secondary | ICD-10-CM | POA: Diagnosis not present

## 2023-02-03 DIAGNOSIS — I10 Essential (primary) hypertension: Secondary | ICD-10-CM | POA: Diagnosis not present

## 2023-03-07 NOTE — Progress Notes (Unsigned)
No chief complaint on file.  History of Present Illness: 67 yo male with history of CAD, obesity, HTN and hyperlipidemia who is here today for follow up. He has been followed in our office by Dr. Katrinka Blazing. 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 Also with moderate disease in the ostium of the obtuse marginal branch and diffuse moderate disease in the LAD and RCA. Nuclear stress test in September 2022 with no ischemia.   He is here today for follow up. The patient denies any chest pain, dyspnea, palpitations, lower extremity edema, orthopnea, PND, dizziness, near syncope or syncope.   Primary Care Physician: Georgann Housekeeper, MD   Past Medical History:  Diagnosis Date   Chest wall abscess 2012   "lanced it & it's gone"   Elevated cholesterol    GERD (gastroesophageal reflux disease)     Past Surgical History:  Procedure Laterality Date   CARDIAC CATHETERIZATION N/A 02/26/2016   Procedure: Left Heart Cath and Coronary Angiography;  Surgeon: Lyn Records, MD;  Location: [facility] INVASIVE CV LAB;  Service: Cardiovascular;  Laterality: N/A;   CARDIAC CATHETERIZATION N/A 02/26/2016   Procedure: Coronary Stent Intervention;  Surgeon: Lyn Records, MD;  Location: [facility] INVASIVE CV LAB;  Service: Cardiovascular;  Laterality: N/A;   COLONOSCOPY  2007   COLONOSCOPY WITH PROPOFOL N/A 02/10/2016   Procedure: COLONOSCOPY WITH PROPOFOL;  Surgeon: Charolett Bumpers, MD;  Location: [facility] ENDOSCOPY;  Service: Endoscopy;  Laterality: N/A;   CORONARY ANGIOPLASTY WITH STENT PLACEMENT  02/26/2016    Current Outpatient Medications  Medication Sig Dispense Refill   amLODipine (NORVASC) 5 MG tablet TAKE 1 TABLET BY MOUTH EVERY DAY 90 tablet 3   atorvastatin (LIPITOR) 80 MG tablet TAKE ONE TABLET BY MOUTH DAILY AT 6 PM. Please keep upcoming appt in September 2022 with Dr. Katrinka Blazing before anymore refills. Thank you 90 tablet 0    clopidogrel (PLAVIX) 75 MG tablet TAKE 1 TABLET BY MOUTH EVERY DAY 30 tablet 11   Multiple Vitamin (MULTIVITAMIN WITH MINERALS) TABS tablet Take 1 tablet by mouth daily.     nitroGLYCERIN (NITROSTAT) 0.4 MG SL tablet Place 1 tablet (0.4 mg total) under the tongue every 5 (five) minutes as needed for chest pain. 25 tablet 5   sildenafil (REVATIO) 20 MG tablet Take 40 mg by mouth daily as needed.     No current facility-administered medications for this visit.    Allergies  Allergen Reactions   Brilinta [Ticagrelor] Shortness Of Breath    Social History   Socioeconomic History   Marital status: Married    Spouse name: Not on file   Number of children: Not on file   Years of education: Not on file   Highest education level: Not on file  Occupational History   Not on file  Tobacco Use   Smoking status: Former    Types: Cigars    Quit date: 01/16/2016    Years since quitting: 7.1   Smokeless tobacco: Never   Tobacco comments:    02/26/2016 "might smoke 1/month"  Vaping Use   Vaping status: Never Used  Substance and Sexual Activity   Alcohol use: Yes    Alcohol/week: 18.0 standard drinks of alcohol    Types: 18 Cans of beer per week   Drug use: No   Sexual activity: Not on file  Other Topics Concern   Not on file  Social History  Narrative   Not on file   Social Determinants of Health   Financial Resource Strain: Not on file  Food Insecurity: Not on file  Transportation Needs: Not on file  Physical Activity: Not on file  Stress: Not on file  Social Connections: Not on file  Intimate Partner Violence: Not on file    Family History  Problem Relation Age of Onset   Hypertension Sister    Stroke Brother     Review of Systems:  As stated in the HPI and otherwise negative.   There were no vitals taken for this visit.  Physical Examination: General: Well developed, well nourished, NAD  HEENT: OP clear, mucus membranes moist  SKIN: warm, dry. No rashes. Neuro: No  focal deficits  Musculoskeletal: Muscle strength 5/5 all ext  Psychiatric: Mood and affect normal  Neck: No JVD, no carotid bruits, no thyromegaly, no lymphadenopathy.  Lungs:Clear bilaterally, no wheezes, rhonci, crackles Cardiovascular: Regular rate and rhythm. No murmurs, gallops or rubs. Abdomen:Soft. Bowel sounds present. Non-tender.  Extremities: No lower extremity edema. Pulses are 2 + in the bilateral DP/PT.  EKG:  EKG {ACTION; IS/IS [id number]} ordered today. The ekg ordered today demonstrates ***  Recent Labs: No results found for requested labs within last 365 days.   Lipid Panel    Component Value Date/Time   CHOL 111 10/25/2018 0833   TRIG 91 10/25/2018 0833   HDL 39 (L) 10/25/2018 0833   CHOLHDL 2.8 10/25/2018 0833   LDLCALC 54 10/25/2018 0833     Wt Readings from Last 3 Encounters:  03/22/22 113 kg  02/04/21 109.8 kg  01/27/21 110 kg    Assessment and Plan:   1. CAD without angina: He has no chest pain. Continue Plavix and statin.   2. HTN: BP is well controlled. Continue Norvasc.   3. Hyperlipidemia: Lipids followed in primary care. LDL ***. Continue statin.   Labs/ tests ordered today include:  No orders of the defined types were placed in this encounter.  Disposition:   F/U with me in one year. ***   Signed, Verne Carrow, MD, [facility] 03/07/2023 2:03 PM    [facility] [address] [place], [place], [place]  [address] Phone: [phone]; Fax: [phone]

## 2023-03-08 ENCOUNTER — Encounter: Payer: Self-pay | Admitting: Cardiovascular Disease

## 2023-03-08 ENCOUNTER — Ambulatory Visit: Payer: BC Managed Care – PPO | Attending: Cardiovascular Disease | Admitting: Cardiovascular Disease

## 2023-03-08 VITALS — BP 140/76 | HR 60 | Ht 71.0 in | Wt 244.6 lb

## 2023-03-08 DIAGNOSIS — I10 Essential (primary) hypertension: Secondary | ICD-10-CM | POA: Diagnosis not present

## 2023-03-08 DIAGNOSIS — I25119 Atherosclerotic heart disease of native coronary artery with unspecified angina pectoris: Secondary | ICD-10-CM

## 2023-03-08 DIAGNOSIS — E785 Hyperlipidemia, unspecified: Secondary | ICD-10-CM | POA: Diagnosis not present

## 2023-03-08 NOTE — Patient Instructions (Signed)

## 2023-06-28 ENCOUNTER — Telehealth (INDEPENDENT_AMBULATORY_CARE_PROVIDER_SITE_OTHER): Payer: Self-pay | Admitting: Otolaryngology

## 2023-06-28 NOTE — Telephone Encounter (Signed)
LVM to confirm appt & location [id number] afm

## 2023-06-29 ENCOUNTER — Ambulatory Visit (INDEPENDENT_AMBULATORY_CARE_PROVIDER_SITE_OTHER): Payer: BC Managed Care – PPO

## 2023-08-03 DIAGNOSIS — I10 Essential (primary) hypertension: Secondary | ICD-10-CM | POA: Diagnosis not present

## 2023-08-03 DIAGNOSIS — E782 Mixed hyperlipidemia: Secondary | ICD-10-CM | POA: Diagnosis not present

## 2023-08-03 DIAGNOSIS — I2510 Atherosclerotic heart disease of native coronary artery without angina pectoris: Secondary | ICD-10-CM | POA: Diagnosis not present

## 2023-12-07 DIAGNOSIS — H25813 Combined forms of age-related cataract, bilateral: Secondary | ICD-10-CM | POA: Diagnosis not present

## 2023-12-07 DIAGNOSIS — H35371 Puckering of macula, right eye: Secondary | ICD-10-CM | POA: Diagnosis not present

## 2023-12-07 DIAGNOSIS — H43813 Vitreous degeneration, bilateral: Secondary | ICD-10-CM | POA: Diagnosis not present

## 2023-12-07 DIAGNOSIS — H43393 Other vitreous opacities, bilateral: Secondary | ICD-10-CM | POA: Diagnosis not present

## 2023-12-08 ENCOUNTER — Telehealth: Payer: Self-pay | Admitting: Cardiovascular Disease

## 2023-12-08 NOTE — Telephone Encounter (Signed)
   Pt c/o of Chest Pain: STAT if active CP, including tightness, pressure, jaw pain, radiating pain to shoulder/upper arm/back, CP unrelieved by Nitro. Symptoms reported of SOB, nausea, vomiting, sweating.  1. Are you having CP right now? no    2. Are you experiencing any other symptoms (ex. SOB, nausea, vomiting, sweating)? Patient states not other symptoms with it.    3. Is your CP continuous or coming and going? Comes and goes.  States it only last for about a minute.    4. Have you taken Nitroglycerin ? no   5. How long have you been experiencing CP? Patient states for the past 2-3 weeks he has been having chest pain with activity.  When he hitting golf balls, walking up a hill, or having sex.    6. If NO CP at time of call then end call with telling Pt to call back or call 911 if Chest pain returns prior to return call from triage team.

## 2023-12-08 NOTE — Telephone Encounter (Signed)
 Chest pain on and off for the past 2 weeks- with activity. Has not had to take a NTG. Had Stent placed 5-6 years ago.   Appt made with DOD 7/25 at 0840  If you experience active Chest Pain, including tightness, pressure, jaw pain, radiating pain to shoulder/upper arm/back, Chest Pain unrelieved by 2-3 doses of Nitroglycerin , Shortness Of Breath, nausea, vomiting, and/or sweating- THEN CALL 911 AND GO TO THE NEAREST EMERGENCY ROOM  Pt verbalized understanding.

## 2023-12-09 ENCOUNTER — Ambulatory Visit: Attending: Internal Medicine | Admitting: Internal Medicine

## 2023-12-09 ENCOUNTER — Encounter: Payer: Self-pay | Admitting: Internal Medicine

## 2023-12-09 VITALS — BP 163/83 | HR 58 | Ht 70.5 in | Wt 246.0 lb

## 2023-12-09 DIAGNOSIS — I10 Essential (primary) hypertension: Secondary | ICD-10-CM

## 2023-12-09 DIAGNOSIS — I25119 Atherosclerotic heart disease of native coronary artery with unspecified angina pectoris: Secondary | ICD-10-CM

## 2023-12-09 DIAGNOSIS — E785 Hyperlipidemia, unspecified: Secondary | ICD-10-CM

## 2023-12-09 DIAGNOSIS — R079 Chest pain, unspecified: Secondary | ICD-10-CM | POA: Diagnosis not present

## 2023-12-09 LAB — CBC

## 2023-12-09 MED ORDER — AMLODIPINE BESYLATE 10 MG PO TABS: 10.0000 mg | ORAL_TABLET | Freq: Every day | ORAL | 3 refills | Status: AC

## 2023-12-09 NOTE — Progress Notes (Signed)
 Cardiology Office Note:  .    Date:  12/09/2023  ID:  Sherley Sharps, DOB 08-04-1955, MRN [id number] PCP: Ransom Other, MD  [facility] Providers Cardiologist:  Lonni Cash, MD     CC: DOD: CP  History of Present Illness: .    Jeffrey [name] is a 68 y.o. male with multivessel coronary artery disease who presents with two weeks of persistent chest pain.  He has a history of multivessel coronary artery disease and underwent percutaneous intervention following a STEMI. He experiences intermittent chest pain with activity, triggered by exertion such as playing golf and during sexual activity. The pain is lighter compared to his previous experience in 2017, when he had a heart catheterization and a blockage treated. He has not been using his nitroglycerin  despite having it available.  He has a history of sinus bradycardia with first-degree AV block and rare premature atrial contractions. His blood pressure was recorded as 139/79 in March, but it is currently elevated. He is unsure of his normal blood pressure range.  His current medications include Plavix  (clopidogrel ) for secondary prevention, amlodipine  5 mg daily for blood pressure management, and atorvastatin  80 mg daily for cholesterol management. He also uses sildenafil (Rhofade) as needed for sexual activity, but he has not taken nitroglycerin  recently as the chest pain has been mild and not threatening.  He is active and plays golf regularly, indicating a robust lifestyle. He denies any lower extremity edema. His creatinine levels have historically been normal.  Discussed the use of AI scribe software for clinical note transcription with the patient, who gave verbal consent to proceed.   Relevant histories: .  Social  - Partner Status: married - Sexual History: sexually active, uses sildenafil - plays golf regularly ROS: As per HPI.   Studies Reviewed: .     Cardiac Studies & Procedures    ______________________________________________________________________________________________ CARDIAC CATHETERIZATION  CARDIAC CATHETERIZATION 02/26/2016  Conclusion  Prox RCA lesion, 50 %stenosed.  Mid RCA to Dist RCA lesion, 30 %stenosed.  1st Mrg lesion, 65 %stenosed.  Prox LAD to Dist LAD lesion, 30 %stenosed.  Dist LAD lesion, 60 %stenosed.  Ost LAD to Prox LAD lesion, 50 %stenosed.  The left ventricular systolic function is normal.  A STENT SYNERGY DES 3.5X24 drug eluting stent was successfully placed, and does not overlap previously placed stent.  Mid Cx to Dist Cx lesion, 99 %stenosed.  Post intervention, there is a 15% residual stenosis.  Ost 1st Mrg to 1st Mrg lesion, 85 %stenosed.   Moderate to moderately severe diffuse coronary artery disease involving the entire right coronary artery, and the left anterior descending with blockages up to 50% in multiple areas.  Severe involvement of the circumflex artery with 99% mid vessel stenosis, thrombus, and TIMI grade 2 flow. The first obtuse marginal contains ostial 70% stenosis.  Inferobasal hypokinesis. EF 55%.  Successful PCI with angioplasty and stenting of the mid circumflex with a 24 x 3.5 mm Synergy drug-eluting stent postdilated to 3.75 mm in diameter. Less than 20% stenosis was noted throughout the stent.  The proximal margin of the stent slightly overlap the ostium of the first obtuse marginal had 85% stenosis was noted post stent deployment. This branch was not treated.  CONCLUSIONS:   Aspirin  and Brilinta  12 months  Aggressive risk factor modification  Aggressive blood pressure control to include low-dose beta blocker therapy if heart rate allows.  Anticipate discharge in a.m.  Findings Coronary Findings Diagnostic  Dominance: Co-dominant  Left Anterior Descending  First Diagonal Branch Vessel is small in size.  Second Diagonal Branch Vessel is small in size.  Third Diagonal  Branch Vessel is small in size.  Ramus Intermedius Vessel is small.  Left Circumflex The lesion is eccentric.  First Obtuse Marginal Branch  Third Obtuse Marginal Branch Vessel is small in size.  Right Coronary Artery  Intervention  Mid Cx to Dist Cx lesion Angioplasty Lesion length: 20 mm. Lesion crossed with guidewire using a WIRE ASAHI PROWATER 180CM. Pre-stent angioplasty was performed using a BALLOON EMERGE MR 3.0X15. Maximum pressure: 10 atm. A STENT SYNERGY DES 3.5X24 drug eluting stent was successfully placed, and does not overlap previously placed stent. Stent strut is well apposed. Post-stent angioplasty was performed using a BALLOON [place] EUPHORA [id number]. Maximum pressure: 14 atm. The pre-interventional distal flow is decreased (TIMI 2).  The post-interventional distal flow is normal (TIMI 3). The intervention was successful . No complications occurred at this lesion. Pressure wire/FFR was not performed on the lesion . IVUS was not performed on the lesion . There is a 15% residual stenosis post intervention.   STRESS TESTS  MYOCARDIAL PERFUSION IMAGING 02/04/2021  Interpretation Summary   Findings are consistent with prior myocardial infarction with peri-infarct ischemia. The study is low risk.   No ST deviation was noted.   LV perfusion is abnormal. Defect 1: There is a medium defect with mild reduction in uptake present in the apical to basal inferolateral location(s) that is partially reversible. There is normal wall motion in the defect area. Consistent with infarction and peri-infarct ischemia.   Left ventricular function is normal. Nuclear stress EF: 55 %. The left ventricular ejection fraction is normal (55-65%). End diastolic cavity size is normal. End systolic cavity size is normal.   Prior study available for comparison from 02/19/2016. There are changes compared to prior study which appear to be improved. The left ventricular ejection fraction has increased.  Segmental wall-motion abnormalities have improved.  Compared with the nuclear stress in 2017, systolic function and perfusion have improved.  There is evidence of a small prior infarct with mild peri-infarct ischemia.            ______________________________________________________________________________________________      Physical Exam:    VS:  BP (!) 163/83 (BP Location: Right Arm)   Pulse (!) 58   Ht 5' 10.5 (1.791 m)   Wt 246 lb (111.6 kg)   SpO2 94%   BMI 34.80 kg/m    Wt Readings from Last 3 Encounters:  12/09/23 246 lb (111.6 kg)  03/08/23 244 lb 9.6 oz (110.9 kg)  03/22/22 249 lb 3.2 oz (113 kg)    Gen: no distress   Neck: No JVD Cardiac: No Rubs or Gallops, no Murmur, regular bradycardia, +2 radial pulses Respiratory: Clear to auscultation bilaterally, normal effort, normal  respiratory rate GI: Soft, nontender, non-distended  MS: No  edema;  moves all extremities Integument: Skin feels warm Neuro:  At time of evaluation, alert and oriented to person/place/time/situation  Psych: Normal affect, patient feels ok   ASSESSMENT AND PLAN: .    An EKG was ordered for CP and shows SBRAD with FAV and rare PAC  Exertional angina Intermittent chest pain for two weeks, triggered by exertion such as playing golf and sexual activity. Symptoms are lighter compared to previous episodes in 2017. Concern for angina or cardiac chest pain. Decision to avoid isosorbide mononitrate due to potential impact on quality of life and interaction with sildenafil. Heart catheterization preferred to assess  coronary arteries due to potential progression of disease. - Increase amlodipine  to 10 mg daily - Schedule heart catheterization with Dr. Verlin - Avoid sildenafil if taking nitrates - Monitor for leg swelling on higher dose CCB  Risks and benefits of cardiac catheterization have been discussed with the patient.  These include bleeding, infection, kidney damage, stroke, heart  attack, death.  The patient understands these risks and is willing to proceed.  Access recommendations: R radial Procedural considerations: I am suspicious that OM is not amenable to easy intervention   Multivessel coronary artery disease Multivessel coronary artery disease with previous stent placement. Known 85% stenosis in LM vessel post-stent deployment. Current symptoms suggest possible progression of disease. Heart catheterization necessary to evaluate current status of coronary arteries. - LDL goal < 70  Hypertension Blood pressure elevated today compared to previous readings. Recent reading of 139/79 in March. Elevated blood pressure may exacerbate coronary artery disease symptoms. Increasing amlodipine  to manage blood pressure and potentially alleviate coronary symptoms. 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 Decision to avoid medications that further slow heart rate due to potential adverse effects.( No BB added)  Post cath f/u with Dr. Santa team   Stanly Leavens, MD FASE [facility] Cardiologist [facility]  [facility]  [address] [place], [address] [place], [place] [address] [phone]  10:14 AM

## 2023-12-09 NOTE — H&P (View-Only) (Signed)
 Cardiology Office Note:  .    Date:  12/09/2023  ID:  Sherley Sharps, DOB 08-04-1955, MRN [id number] PCP: Ransom Other, MD  [facility] Providers Cardiologist:  Lonni Cash, MD     CC: DOD: CP  History of Present Illness: .    Jeffrey [name] is a 68 y.o. male with multivessel coronary artery disease who presents with two weeks of persistent chest pain.  He has a history of multivessel coronary artery disease and underwent percutaneous intervention following a STEMI. He experiences intermittent chest pain with activity, triggered by exertion such as playing golf and during sexual activity. The pain is lighter compared to his previous experience in 2017, when he had a heart catheterization and a blockage treated. He has not been using his nitroglycerin  despite having it available.  He has a history of sinus bradycardia with first-degree AV block and rare premature atrial contractions. His blood pressure was recorded as 139/79 in March, but it is currently elevated. He is unsure of his normal blood pressure range.  His current medications include Plavix  (clopidogrel ) for secondary prevention, amlodipine  5 mg daily for blood pressure management, and atorvastatin  80 mg daily for cholesterol management. He also uses sildenafil (Rhofade) as needed for sexual activity, but he has not taken nitroglycerin  recently as the chest pain has been mild and not threatening.  He is active and plays golf regularly, indicating a robust lifestyle. He denies any lower extremity edema. His creatinine levels have historically been normal.  Discussed the use of AI scribe software for clinical note transcription with the patient, who gave verbal consent to proceed.   Relevant histories: .  Social  - Partner Status: married - Sexual History: sexually active, uses sildenafil - plays golf regularly ROS: As per HPI.   Studies Reviewed: .     Cardiac Studies & Procedures    ______________________________________________________________________________________________ CARDIAC CATHETERIZATION  CARDIAC CATHETERIZATION 02/26/2016  Conclusion  Prox RCA lesion, 50 %stenosed.  Mid RCA to Dist RCA lesion, 30 %stenosed.  1st Mrg lesion, 65 %stenosed.  Prox LAD to Dist LAD lesion, 30 %stenosed.  Dist LAD lesion, 60 %stenosed.  Ost LAD to Prox LAD lesion, 50 %stenosed.  The left ventricular systolic function is normal.  A STENT SYNERGY DES 3.5X24 drug eluting stent was successfully placed, and does not overlap previously placed stent.  Mid Cx to Dist Cx lesion, 99 %stenosed.  Post intervention, there is a 15% residual stenosis.  Ost 1st Mrg to 1st Mrg lesion, 85 %stenosed.   Moderate to moderately severe diffuse coronary artery disease involving the entire right coronary artery, and the left anterior descending with blockages up to 50% in multiple areas.  Severe involvement of the circumflex artery with 99% mid vessel stenosis, thrombus, and TIMI grade 2 flow. The first obtuse marginal contains ostial 70% stenosis.  Inferobasal hypokinesis. EF 55%.  Successful PCI with angioplasty and stenting of the mid circumflex with a 24 x 3.5 mm Synergy drug-eluting stent postdilated to 3.75 mm in diameter. Less than 20% stenosis was noted throughout the stent.  The proximal margin of the stent slightly overlap the ostium of the first obtuse marginal had 85% stenosis was noted post stent deployment. This branch was not treated.  CONCLUSIONS:   Aspirin  and Brilinta  12 months  Aggressive risk factor modification  Aggressive blood pressure control to include low-dose beta blocker therapy if heart rate allows.  Anticipate discharge in a.m.  Findings Coronary Findings Diagnostic  Dominance: Co-dominant  Left Anterior Descending  First Diagonal Branch Vessel is small in size.  Second Diagonal Branch Vessel is small in size.  Third Diagonal  Branch Vessel is small in size.  Ramus Intermedius Vessel is small.  Left Circumflex The lesion is eccentric.  First Obtuse Marginal Branch  Third Obtuse Marginal Branch Vessel is small in size.  Right Coronary Artery  Intervention  Mid Cx to Dist Cx lesion Angioplasty Lesion length: 20 mm. Lesion crossed with guidewire using a WIRE ASAHI PROWATER 180CM. Pre-stent angioplasty was performed using a BALLOON EMERGE MR 3.0X15. Maximum pressure: 10 atm. A STENT SYNERGY DES 3.5X24 drug eluting stent was successfully placed, and does not overlap previously placed stent. Stent strut is well apposed. Post-stent angioplasty was performed using a BALLOON [place] EUPHORA [id number]. Maximum pressure: 14 atm. The pre-interventional distal flow is decreased (TIMI 2).  The post-interventional distal flow is normal (TIMI 3). The intervention was successful . No complications occurred at this lesion. Pressure wire/FFR was not performed on the lesion . IVUS was not performed on the lesion . There is a 15% residual stenosis post intervention.   STRESS TESTS  MYOCARDIAL PERFUSION IMAGING 02/04/2021  Interpretation Summary   Findings are consistent with prior myocardial infarction with peri-infarct ischemia. The study is low risk.   No ST deviation was noted.   LV perfusion is abnormal. Defect 1: There is a medium defect with mild reduction in uptake present in the apical to basal inferolateral location(s) that is partially reversible. There is normal wall motion in the defect area. Consistent with infarction and peri-infarct ischemia.   Left ventricular function is normal. Nuclear stress EF: 55 %. The left ventricular ejection fraction is normal (55-65%). End diastolic cavity size is normal. End systolic cavity size is normal.   Prior study available for comparison from 02/19/2016. There are changes compared to prior study which appear to be improved. The left ventricular ejection fraction has increased.  Segmental wall-motion abnormalities have improved.  Compared with the nuclear stress in 2017, systolic function and perfusion have improved.  There is evidence of a small prior infarct with mild peri-infarct ischemia.            ______________________________________________________________________________________________      Physical Exam:    VS:  BP (!) 163/83 (BP Location: Right Arm)   Pulse (!) 58   Ht 5' 10.5 (1.791 m)   Wt 246 lb (111.6 kg)   SpO2 94%   BMI 34.80 kg/m    Wt Readings from Last 3 Encounters:  12/09/23 246 lb (111.6 kg)  03/08/23 244 lb 9.6 oz (110.9 kg)  03/22/22 249 lb 3.2 oz (113 kg)    Gen: no distress   Neck: No JVD Cardiac: No Rubs or Gallops, no Murmur, regular bradycardia, +2 radial pulses Respiratory: Clear to auscultation bilaterally, normal effort, normal  respiratory rate GI: Soft, nontender, non-distended  MS: No  edema;  moves all extremities Integument: Skin feels warm Neuro:  At time of evaluation, alert and oriented to person/place/time/situation  Psych: Normal affect, patient feels ok   ASSESSMENT AND PLAN: .    An EKG was ordered for CP and shows SBRAD with FAV and rare PAC  Exertional angina Intermittent chest pain for two weeks, triggered by exertion such as playing golf and sexual activity. Symptoms are lighter compared to previous episodes in 2017. Concern for angina or cardiac chest pain. Decision to avoid isosorbide mononitrate due to potential impact on quality of life and interaction with sildenafil. Heart catheterization preferred to assess  coronary arteries due to potential progression of disease. - Increase amlodipine  to 10 mg daily - Schedule heart catheterization with Dr. Verlin - Avoid sildenafil if taking nitrates - Monitor for leg swelling on higher dose CCB  Risks and benefits of cardiac catheterization have been discussed with the patient.  These include bleeding, infection, kidney damage, stroke, heart  attack, death.  The patient understands these risks and is willing to proceed.  Access recommendations: R radial Procedural considerations: I am suspicious that OM is not amenable to easy intervention   Multivessel coronary artery disease Multivessel coronary artery disease with previous stent placement. Known 85% stenosis in LM vessel post-stent deployment. Current symptoms suggest possible progression of disease. Heart catheterization necessary to evaluate current status of coronary arteries. - LDL goal < 70  Hypertension Blood pressure elevated today compared to previous readings. Recent reading of 139/79 in March. Elevated blood pressure may exacerbate coronary artery disease symptoms. Increasing amlodipine  to manage blood pressure and potentially alleviate coronary symptoms. 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 Decision to avoid medications that further slow heart rate due to potential adverse effects.( No BB added)  Post cath f/u with Dr. Santa team   Stanly Leavens, MD FASE [facility] Cardiologist [facility]  [facility]  [address] [place], [address] [place], [place] [address] [phone]  10:14 AM

## 2023-12-09 NOTE — Patient Instructions (Signed)
 Medication Instructions:  Your physician has recommended you make the following change in your medication:  INCREASE: amlodipine  (Norvasc ) to 10 mg by mouth once daily  *If you need a refill on your cardiac medications before your next appointment, please call your pharmacy*  Lab Work: BMP, CBC at [facility] on 1st floor  If you have labs (blood work) drawn today and your tests are completely normal, you will receive your results only by: MyChart Message (if you have MyChart) OR A paper copy in the mail If you have any lab test that is abnormal or we need to change your treatment, we will call you to review the results.  Testing/Procedures: Your physician has requested that you have a cardiac catheterization. Cardiac catheterization is used to diagnose and/or treat various heart conditions. Doctors may recommend this procedure for a number of different reasons. The most common reason is to evaluate chest pain. Chest pain can be a symptom of coronary artery disease (CAD), and cardiac catheterization can show whether plaque is narrowing or blocking your heart's arteries. This procedure is also used to evaluate the valves, as well as measure the blood flow and oxygen levels in different parts of your heart. For further information please visit [email or web address] Please follow instruction sheet, as given.   Follow-Up: At [facility], you and your health needs are our priority.  As part of our continuing mission to provide you with exceptional heart care, our providers are all part of one team.  This team includes your primary Cardiologist (physician) and Advanced Practice Providers or APPs (Physician Assistants and Nurse Practitioners) who all work together to provide you with the care you need, when you need it.  Your next appointment:   6-8 week(s)  Provider:   One of our Advanced Practice Providers (APPs): Morse Clause, PA-C  Lamarr Satterfield, NP Miriam Shams, NP  Olivia Pavy,  PA-C Josefa Beauvais, NP  Leontine Salen, PA-C Orren Fabry, PA-C  [place], PA-C Ernest Dick, NP  Damien Braver, NP Jon Hails, PA-C  Waddell Donath, PA-C    Dayna Dunn, PA-C  Scott Weaver, PA-C Lum Louis, NP Katlyn West, NP Callie Goodrich, PA-C  Evan Williams, PA-C Sheng Haley, PA-C  Xika Zhao, NP Kathleen Johnson, PA-C    We recommend signing up for the patient portal called MyChart.  Sign up information is provided on this After Visit Summary.  MyChart is used to connect with patients for Virtual Visits (Telemedicine).  Patients are able to view lab/test results, encounter notes, upcoming appointments, etc.  Non-urgent messages can be sent to your provider as well.   To learn more about what you can do with MyChart, go to [email or web address].   Other Instructions  Jeffrey [name] are scheduled for a Cardiac Catheterization on Thursday, July 31 with Dr. Lonni Cash.  1. Please arrive at the [facility] (Main Entrance A) at [facility]: [address] [place], [place] [address] at 10:00 AM (This time is 2 hour(s) before your procedure to ensure your preparation).   Free valet parking service is available. You will check in at ADMITTING. The support person will be asked to wait in the waiting room.  It is OK to have someone drop you off and come back when you are ready to be discharged.    Special note: Every effort is made to have your procedure done on time. Please understand that emergencies sometimes delay scheduled procedures.  2. Diet: Do not eat solid  foods after midnight.  The patient may have clear liquids until 5am upon the day of the procedure.  3. Labs: You will need to have blood drawn TODAY.  4. Medication instructions in preparation for your procedure:   Contrast Allergy: No   On the morning of your procedure, take your Plavix /Clopidogrel  and any morning medicines NOT listed above.  You may use sips of water.  5. Plan to go home  the same day, you will only stay overnight if medically necessary. 6. Bring a current list of your medications and current insurance cards. 7. You MUST have a responsible person to drive you home. 8. Someone MUST be with you the first 24 hours after you arrive home or your discharge will be delayed. 9. Please wear clothes that are easy to get on and off and wear slip-on shoes.  Thank you for allowing us  to care for you!   -- [facility]

## 2023-12-10 LAB — BASIC METABOLIC PANEL WITH GFR
BUN/Creatinine Ratio: 13 (ref 10–24)
BUN: 14 mg/dL (ref 8–27)
CO2: 22 mmol/L (ref 20–29)
Calcium: 9.3 mg/dL (ref 8.6–10.2)
Chloride: 102 mmol/L (ref 96–106)
Creatinine, Ser: 1.08 mg/dL (ref 0.76–1.27)
Glucose: 91 mg/dL (ref 70–99)
Potassium: 4.3 mmol/L (ref 3.5–5.2)
Sodium: 141 mmol/L (ref 134–144)
eGFR: 74 mL/min/1.73 (ref >59)

## 2023-12-10 LAB — CBC
Hematocrit: 42.2 % (ref 37.5–51.0)
Hemoglobin: 13.7 g/dL (ref 13.0–17.7)
MCH: 31.9 pg (ref 26.6–33.0)
MCHC: 32.5 g/dL (ref 31.5–35.7)
MCV: 98 fL — ABNORMAL HIGH (ref 79–97)
Platelets: 237 x10E3/uL (ref 150–450)
RBC: 4.29 x10E6/uL (ref 4.14–5.80)
RDW: 12.5 % (ref 11.6–15.4)
WBC: 5.1 x10E3/uL (ref 3.4–10.8)

## 2023-12-12 ENCOUNTER — Ambulatory Visit: Payer: Self-pay

## 2023-12-13 ENCOUNTER — Telehealth: Payer: Self-pay | Admitting: *Deleted

## 2023-12-13 NOTE — Telephone Encounter (Signed)
 Cardiac Catheterization scheduled at [facility] for: Thursday December 15, 2023 12 Noon Arrival time [facility] Main Entrance A at: 10 AM  Nothing to eat after midnight prior to procedure, stay well hydrated, may drink clear liquids until leaving for hospital. Drink 16 oz bottle of water  on the way to the hospital. (Approved liquids: water , clear juice, clear tea, black coffee, fruit juices, non-citric and without pulp, carbonated beverages, Gatorade, Kool -Aid, plain Jello-O and plain ice popsicles).   Medication instructions: -Usual morning medications can be taken with sips of water  including aspirin  81 mg and Plavix  75 mg.  Plan to go home the same day, you will only stay overnight if medically necessary.  You must have responsible adult to drive you home.  Someone must be with you the first 24 hours after you arrive home.  Reviewed procedure instructions with patient.

## 2023-12-15 ENCOUNTER — Other Ambulatory Visit: Payer: Self-pay

## 2023-12-15 ENCOUNTER — Encounter (HOSPITAL_COMMUNITY): Payer: Self-pay | Admitting: Cardiovascular Disease

## 2023-12-15 ENCOUNTER — Ambulatory Visit (HOSPITAL_COMMUNITY)
Admission: RE | Admit: 2023-12-15 | Discharge: 2023-12-15 | Disposition: A | Discharge status: Home / Self Care | Attending: Cardiovascular Disease | Admitting: Cardiovascular Disease

## 2023-12-15 ENCOUNTER — Encounter (HOSPITAL_COMMUNITY)
Admission: RE | Disposition: A | Discharge status: Home / Self Care | Payer: Self-pay | Source: Home / Self Care | Attending: Cardiovascular Disease

## 2023-12-15 ENCOUNTER — Other Ambulatory Visit: Payer: Self-pay | Admitting: Cardiology

## 2023-12-15 DIAGNOSIS — I25119 Atherosclerotic heart disease of native coronary artery with unspecified angina pectoris: Secondary | ICD-10-CM

## 2023-12-15 DIAGNOSIS — Z955 Presence of coronary angioplasty implant and graft: Secondary | ICD-10-CM | POA: Diagnosis not present

## 2023-12-15 DIAGNOSIS — Z7902 Long term (current) use of antithrombotics/antiplatelets: Secondary | ICD-10-CM | POA: Insufficient documentation

## 2023-12-15 DIAGNOSIS — I10 Essential (primary) hypertension: Secondary | ICD-10-CM | POA: Diagnosis not present

## 2023-12-15 DIAGNOSIS — R001 Bradycardia, unspecified: Secondary | ICD-10-CM | POA: Insufficient documentation

## 2023-12-15 DIAGNOSIS — I252 Old myocardial infarction: Secondary | ICD-10-CM | POA: Diagnosis not present

## 2023-12-15 DIAGNOSIS — I440 Atrioventricular block, first degree: Secondary | ICD-10-CM | POA: Diagnosis not present

## 2023-12-15 DIAGNOSIS — I25110 Atherosclerotic heart disease of native coronary artery with unstable angina pectoris: Secondary | ICD-10-CM | POA: Insufficient documentation

## 2023-12-15 DIAGNOSIS — Z79899 Other long term (current) drug therapy: Secondary | ICD-10-CM | POA: Diagnosis not present

## 2023-12-15 DIAGNOSIS — I2510 Atherosclerotic heart disease of native coronary artery without angina pectoris: Secondary | ICD-10-CM | POA: Diagnosis not present

## 2023-12-15 DIAGNOSIS — Z006 Encounter for examination for normal comparison and control in clinical research program: Secondary | ICD-10-CM

## 2023-12-15 HISTORY — PX: LEFT HEART CATH AND CORONARY ANGIOGRAPHY: CATH118249

## 2023-12-15 SURGERY — LEFT HEART CATH AND CORONARY ANGIOGRAPHY
Anesthesia: LOCAL

## 2023-12-15 MED ORDER — FREE WATER: 500.0000 mL | Freq: Once | Status: DC

## 2023-12-15 MED ORDER — LABETALOL HCL 5 MG/ML IV SOLN
10.0000 mg | INTRAVENOUS | Status: DC | PRN
Start: 2023-12-15 — End: 2023-12-15

## 2023-12-15 MED ORDER — ACETAMINOPHEN 325 MG PO TABS: 650.0000 mg | ORAL_TABLET | ORAL | Status: DC | PRN

## 2023-12-15 MED ORDER — SODIUM CHLORIDE 0.9 % IV SOLN: 250.0000 mL | INTRAVENOUS | Status: DC | PRN

## 2023-12-15 MED ORDER — HYDRALAZINE HCL 20 MG/ML IJ SOLN: 10.0000 mg | INTRAMUSCULAR | Status: DC | PRN

## 2023-12-15 MED ORDER — HEPARIN SODIUM (PORCINE) 1000 UNIT/ML IJ SOLN
INTRAMUSCULAR | Status: DC | PRN
  Administered 2023-12-15: 6000 [IU] via INTRAVENOUS

## 2023-12-15 MED ORDER — SODIUM CHLORIDE 0.9% FLUSH
3.0000 mL | INTRAVENOUS | Status: DC | PRN
Start: 2023-12-15 — End: 2023-12-15

## 2023-12-15 MED ORDER — LIDOCAINE HCL (PF) 1 % IJ SOLN
INTRAMUSCULAR | Status: DC | PRN
Start: 2023-12-15 — End: 2023-12-15
  Administered 2023-12-15: 2 mL

## 2023-12-15 MED ORDER — ONDANSETRON HCL 4 MG/2ML IJ SOLN: 4.0000 mg | Freq: Four times a day (QID) | INTRAMUSCULAR | Status: DC | PRN

## 2023-12-15 MED ORDER — IOHEXOL 350 MG/ML SOLN
INTRAVENOUS | Status: DC | PRN
  Administered 2023-12-15: 55 mL

## 2023-12-15 MED ORDER — FENTANYL CITRATE (PF) 100 MCG/2ML IJ SOLN
INTRAMUSCULAR | Status: AC
Start: 2023-12-15 — End: 2023-12-15
  Filled 2023-12-15: qty 2

## 2023-12-15 MED ORDER — VERAPAMIL HCL 2.5 MG/ML IV SOLN
INTRAVENOUS | Status: DC | PRN
  Administered 2023-12-15: 10 mL via INTRA_ARTERIAL

## 2023-12-15 MED ORDER — LIDOCAINE HCL (PF) 1 % IJ SOLN
INTRAMUSCULAR | Status: AC
  Filled 2023-12-15: qty 30

## 2023-12-15 MED ORDER — VERAPAMIL HCL 2.5 MG/ML IV SOLN
INTRAVENOUS | Status: AC
  Filled 2023-12-15: qty 2

## 2023-12-15 MED ORDER — MIDAZOLAM HCL 2 MG/2ML IJ SOLN
INTRAMUSCULAR | Status: AC
  Filled 2023-12-15: qty 2

## 2023-12-15 MED ORDER — MIDAZOLAM HCL 2 MG/2ML IJ SOLN
INTRAMUSCULAR | Status: DC | PRN
Start: 2023-12-15 — End: 2023-12-15
  Administered 2023-12-15: 2 mg via INTRAVENOUS

## 2023-12-15 MED ORDER — CLOPIDOGREL BISULFATE 75 MG PO TABS: 75.0000 mg | ORAL_TABLET | ORAL | Status: DC

## 2023-12-15 MED ORDER — SODIUM CHLORIDE 0.9% FLUSH: 3.0000 mL | INTRAVENOUS | Status: DC | PRN

## 2023-12-15 MED ORDER — HEPARIN SODIUM (PORCINE) 1000 UNIT/ML IJ SOLN
INTRAMUSCULAR | Status: AC
  Filled 2023-12-15: qty 10

## 2023-12-15 MED ORDER — FENTANYL CITRATE (PF) 100 MCG/2ML IJ SOLN
INTRAMUSCULAR | Status: DC | PRN
  Administered 2023-12-15: 50 ug via INTRAVENOUS

## 2023-12-15 MED ORDER — ASPIRIN 81 MG PO CHEW: 81.0000 mg | CHEWABLE_TABLET | ORAL | Status: DC

## 2023-12-15 MED ORDER — SODIUM CHLORIDE 0.9% FLUSH: 3.0000 mL | Freq: Two times a day (BID) | INTRAVENOUS | Status: DC

## 2023-12-15 MED ORDER — HEPARIN (PORCINE) IN NACL 1000-0.9 UT/500ML-% IV SOLN
INTRAVENOUS | Status: DC | PRN
  Administered 2023-12-15: 1000 mL via SURGICAL_CAVITY

## 2023-12-15 SURGICAL SUPPLY — 6 items
CATH 5FR JL3.5 JR4 ANG PIG MP (CATHETERS) IMPLANT
DEVICE RAD COMP TR BAND LRG (VASCULAR PRODUCTS) IMPLANT
GLIDESHEATH SLEND SS 6F .021 (SHEATH) IMPLANT
GUIDEWIRE INQWIRE 1.5J.035X260 (WIRE) IMPLANT
PACK CARDIAC CATHETERIZATION (CUSTOM PROCEDURE TRAY) ×1 IMPLANT
SET ATX-X65L (MISCELLANEOUS) IMPLANT

## 2023-12-15 NOTE — Progress Notes (Signed)
 Ordering echocardiogram to be completed at [facility] for chest pain/pre-CABG

## 2023-12-15 NOTE — Discharge Instructions (Signed)

## 2023-12-15 NOTE — Research (Cosign Needed Addendum)
 Prevail Informed Consent   Subject Name: Jeffrey [name]  Subject met inclusion and exclusion criteria.  The informed consent form, study requirements and expectations were reviewed with the subject and questions and concerns were addressed prior to the signing of the consent form.  The subject verbalized understanding of the trial requirements.  The subject agreed to participate in the Prevail trial and signed the informed consent at 1020 on 12/15/2023.  The informed consent was obtained prior to performance of any protocol-specific procedures for the subject.  A copy of the signed informed consent was given to the subject and a copy was placed in the subject's medical record.   [name]   Screen fail

## 2023-12-15 NOTE — Interval H&P Note (Signed)
 History and Physical Interval Note:  12/15/2023 12:36 PM  Jeffrey [name]  has presented today for surgery, with the diagnosis of angina - cad.  The various methods of treatment have been discussed with the patient and family. After consideration of risks, benefits and other options for treatment, the patient has consented to  Procedure(s): LEFT HEART CATH AND CORONARY ANGIOGRAPHY (N/A) as a surgical intervention.  The patient's history has been reviewed, patient examined, no change in status, stable for surgery.  I have reviewed the patient's chart and labs.  Questions were answered to the patient's satisfaction.    Cath Lab Visit (complete for each Cath Lab visit)  Clinical Evaluation Leading to the Procedure:   ACS: No.  Non-ACS:    Anginal Classification: CCS III  Anti-ischemic medical therapy: Minimal Therapy (1 class of medications)  Non-Invasive Test Results: No non-invasive testing performed  Prior CABG: No previous CABG    Jeffrey [name]

## 2023-12-16 ENCOUNTER — Ambulatory Visit (HOSPITAL_BASED_OUTPATIENT_CLINIC_OR_DEPARTMENT_OTHER)
Admission: RE | Admit: 2023-12-16 | Discharge: 2023-12-16 | Disposition: A | Discharge status: Home / Self Care | Source: Home / Self Care | Attending: Cardiovascular Disease | Admitting: Cardiovascular Disease

## 2023-12-16 DIAGNOSIS — I77810 Thoracic aortic ectasia: Secondary | ICD-10-CM | POA: Diagnosis not present

## 2023-12-16 DIAGNOSIS — I25110 Atherosclerotic heart disease of native coronary artery with unstable angina pectoris: Secondary | ICD-10-CM | POA: Diagnosis not present

## 2023-12-16 DIAGNOSIS — I25119 Atherosclerotic heart disease of native coronary artery with unspecified angina pectoris: Secondary | ICD-10-CM | POA: Diagnosis not present

## 2023-12-16 DIAGNOSIS — I252 Old myocardial infarction: Secondary | ICD-10-CM | POA: Diagnosis not present

## 2023-12-16 DIAGNOSIS — I517 Cardiomegaly: Secondary | ICD-10-CM | POA: Diagnosis not present

## 2023-12-16 DIAGNOSIS — Z79899 Other long term (current) drug therapy: Secondary | ICD-10-CM | POA: Diagnosis not present

## 2023-12-16 DIAGNOSIS — R001 Bradycardia, unspecified: Secondary | ICD-10-CM | POA: Diagnosis not present

## 2023-12-16 DIAGNOSIS — I080 Rheumatic disorders of both mitral and aortic valves: Secondary | ICD-10-CM | POA: Diagnosis not present

## 2023-12-16 DIAGNOSIS — Z7902 Long term (current) use of antithrombotics/antiplatelets: Secondary | ICD-10-CM | POA: Diagnosis not present

## 2023-12-16 DIAGNOSIS — I5030 Unspecified diastolic (congestive) heart failure: Secondary | ICD-10-CM | POA: Diagnosis not present

## 2023-12-16 DIAGNOSIS — Z955 Presence of coronary angioplasty implant and graft: Secondary | ICD-10-CM | POA: Diagnosis not present

## 2023-12-16 DIAGNOSIS — I440 Atrioventricular block, first degree: Secondary | ICD-10-CM | POA: Diagnosis not present

## 2023-12-16 DIAGNOSIS — I10 Essential (primary) hypertension: Secondary | ICD-10-CM | POA: Diagnosis not present

## 2023-12-16 LAB — ECHOCARDIOGRAM COMPLETE
Area-P 1/2: 2.59 cm2
S' Lateral: 3.4 cm

## 2023-12-17 ENCOUNTER — Ambulatory Visit: Payer: Self-pay | Admitting: Cardiovascular Disease

## 2023-12-29 NOTE — H&P (View-Only) (Signed)
 [address]       [place] [address]             [id number]        [name] [facility] Medical Record #[id number] Date of Birth: 1956-01-02  Referring: Verlin Lonni BIRCH* Primary Care: Ransom Other, MD Primary Cardiologist:Christopher Verlin, MD  Chief Complaint:    Chief Complaint  Patient presents with   Coronary Artery Disease    New patient consultation, CATH 7/31, ECHO 7/31    History of Present Illness:     Jeffrey [name] is a 68 y.o. 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  He has been symptomatic with exertional angina while playing golf.  He also admits to fatigue, and shortness of breath.  He has a hx of PCI to the circ in 2017   Past Medical and Surgical History: Previous Chest Surgery: no Previous Chest Radiation: no Diabetes Mellitus: no.  HbA1C 5 Creatinine:  Lab Results  Component Value Date   CREATININE 1.08 12/09/2023   CREATININE 1.03 10/25/2018   CREATININE 1.25 04/29/2016     Past Medical History:  Diagnosis Date   Chest wall abscess 2012   lanced it & it's gone   Elevated cholesterol    GERD (gastroesophageal reflux disease)     Past Surgical History:  Procedure Laterality Date   CARDIAC CATHETERIZATION N/A 02/26/2016   Procedure: Left Heart Cath and Coronary Angiography;  Surgeon: Victory LELON Sharps, MD;  Location: [facility] INVASIVE CV LAB;  Service: Cardiovascular;  Laterality: N/A;   CARDIAC CATHETERIZATION N/A 02/26/2016   Procedure: Coronary Stent Intervention;  Surgeon: Victory LELON Sharps, MD;  Location: [facility] INVASIVE CV LAB;  Service: Cardiovascular;  Laterality: N/A;   COLONOSCOPY  2007   COLONOSCOPY WITH PROPOFOL  N/A 02/10/2016   Procedure: COLONOSCOPY WITH PROPOFOL ;  Surgeon: Gladis MARLA Louder, MD;  Location: [facility] ENDOSCOPY;  Service: Endoscopy;  Laterality: N/A;   CORONARY ANGIOPLASTY WITH STENT PLACEMENT  02/26/2016   LEFT HEART CATH AND CORONARY ANGIOGRAPHY N/A 12/15/2023   Procedure: LEFT HEART  CATH AND CORONARY ANGIOGRAPHY;  Surgeon: Verlin Lonni BIRCH, MD;  Location: [facility] INVASIVE CV LAB;  Service: Cardiovascular;  Laterality: N/A;    Social History:  Social History   Tobacco Use  Smoking Status Former   Types: Cigars   Quit date: 01/16/2016   Years since quitting: 7.9  Smokeless Tobacco Never  Tobacco Comments   02/26/2016 might smoke 1/month    Social History   Substance and Sexual Activity  Alcohol Use Yes   Alcohol/week: 18.0 standard drinks of alcohol   Types: 18 Cans of beer per week     Allergies  Allergen Reactions   Brilinta  [Ticagrelor ] Shortness Of Breath    Medications:  Anti-Coagulation: PLavix   Current Outpatient Medications  Medication Sig Dispense Refill   amLODipine  (NORVASC ) 10 MG tablet Take 1 tablet (10 mg total) by mouth daily. 90 tablet 3   aspirin  EC 81 MG tablet Take 81 mg by mouth daily. Swallow whole.     atorvastatin  (LIPITOR ) 80 MG tablet TAKE ONE TABLET BY MOUTH DAILY AT 6 PM. Please keep upcoming appt in September 2022 with Dr. Sharps before anymore refills. Thank you 90 tablet 0   clopidogrel  (PLAVIX ) 75 MG tablet TAKE 1 TABLET BY MOUTH EVERY DAY 30 tablet 11   Glycerin-Hypromellose-PEG 400 (DRY EYE RELIEF DROPS OP) Place 1 drop into both eyes daily as needed (Dry eyes).     Multiple  Vitamin (MULTIVITAMIN WITH MINERALS) TABS tablet Take 1 tablet by mouth daily.     nitroGLYCERIN  (NITROSTAT ) 0.4 MG SL tablet Place 1 tablet (0.4 mg total) under the tongue every 5 (five) minutes as needed for chest pain. 25 tablet 5   sildenafil (REVATIO) 20 MG tablet Take 40 mg by mouth daily as needed.     No current facility-administered medications for this visit.   Facility-Administered Medications Ordered in Other Visits  Medication Dose Route Frequency Provider Last Rate Last Admin   [facility] Patient & Family Education   Does not apply Once Louise Rawson O, MD        (Not in a hospital admission)   Family  History  Problem Relation Age of Onset   Hypertension Sister    Stroke Brother      Review of Systems:   Review of Systems  Constitutional:  Positive for malaise/fatigue.  Respiratory:  Positive for shortness of breath.   Cardiovascular:  Positive for chest pain.  Neurological: Negative.       Physical Exam: BP (!) 142/75 (BP Location: Left Arm, Patient Position: Sitting, Cuff Size: Normal)   Pulse 61   Resp 20   Ht 5' 10 (1.778 m)   Wt 247 lb 1.6 oz (112.1 kg)   SpO2 96% Comment: RA  BMI 35.46 kg/m  Physical Exam Constitutional:      General: He is not in acute distress.    Appearance: He is not ill-appearing.  HENT:     Head: Normocephalic and atraumatic.  Eyes:     Extraocular Movements: Extraocular movements intact.  Cardiovascular:     Rate and Rhythm: Normal rate.  Pulmonary:     Effort: Pulmonary effort is normal. No respiratory distress.  Abdominal:     General: Abdomen is flat. There is no distension.  Musculoskeletal:        General: Normal range of motion.     Cervical back: Normal range of motion.  Skin:    General: Skin is warm and dry.  Neurological:     General: No focal deficit present.     Mental Status: He is alert and oriented to person, place, and time.       Diagnostic Studies & Laboratory data: Cardiac Studies & Procedures   ______________________________________________________________________________________________ CARDIAC CATHETERIZATION  CARDIAC CATHETERIZATION 12/15/2023  Conclusion   Prox RCA lesion is 70% stenosed.   Mid RCA lesion is 60% stenosed.   1st Mrg lesion is 80% stenosed.   2nd Mrg lesion is 80% stenosed.   Mid Cx to Dist Cx lesion is 90% stenosed.   Prox Cx lesion is 20% stenosed.   Ost LAD to Prox LAD lesion is 90% stenosed.   Mid LAD lesion is 60% stenosed.   The left ventricular ejection fraction is 50-55% by visual estimate.  Severe ostial LAD stenosis. Moderate mid LAD stenosis. Patent proximal  Circumflex stent with minimal restenosis. Severe stenosis in the ostium of the first obtuse marginal branch (jailed by the stent). Severe ostial stenosis second obtuse marginal branch. Severe stenosis distal AV groove Circumflex involving the third obtuse marginal branch. Large dominant RCA with moderately severe mid vessel stenosis Normal LV systolic function.  Recommendations: He has severe three vessel CAD including ostial LAD stenosis. Will refer to CT surgery for CABG as outpatient.  Findings Coronary Findings Diagnostic  Dominance: Right  Left Anterior Descending Vessel is large. Ost LAD to Prox LAD lesion is 90% stenosed. Mid LAD lesion is 60%  stenosed.  Left Circumflex Vessel is large. Prox Cx lesion is 20% stenosed. The lesion was previously treated using a drug eluting stent over 2 years ago. Mid Cx to Dist Cx lesion is 90% stenosed.  First Obtuse Marginal Branch 1st Mrg lesion is 80% stenosed.  Second Obtuse Marginal Branch 2nd Mrg lesion is 80% stenosed.  Right Coronary Artery Vessel is large. Prox RCA lesion is 70% stenosed. Mid RCA lesion is 60% stenosed.  Intervention  No interventions have been documented.   CARDIAC CATHETERIZATION  CARDIAC CATHETERIZATION 02/26/2016  Conclusion  Prox RCA lesion, 50 %stenosed.  Mid RCA to Dist RCA lesion, 30 %stenosed.  1st Mrg lesion, 65 %stenosed.  Prox LAD to Dist LAD lesion, 30 %stenosed.  Dist LAD lesion, 60 %stenosed.  Ost LAD to Prox LAD lesion, 50 %stenosed.  The left ventricular systolic function is normal.  A STENT SYNERGY DES 3.5X24 drug eluting stent was successfully placed, and does not overlap previously placed stent.  Mid Cx to Dist Cx lesion, 99 %stenosed.  Post intervention, there is a 15% residual stenosis.  Ost 1st Mrg to 1st Mrg lesion, 85 %stenosed.   Moderate to moderately severe diffuse coronary artery disease involving the entire right coronary artery, and the left anterior  descending with blockages up to 50% in multiple areas.  Severe involvement of the circumflex artery with 99% mid vessel stenosis, thrombus, and TIMI grade 2 flow. The first obtuse marginal contains ostial 70% stenosis.  Inferobasal hypokinesis. EF 55%.  Successful PCI with angioplasty and stenting of the mid circumflex with a 24 x 3.5 mm Synergy drug-eluting stent postdilated to 3.75 mm in diameter. Less than 20% stenosis was noted throughout the stent.  The proximal margin of the stent slightly overlap the ostium of the first obtuse marginal had 85% stenosis was noted post stent deployment. This branch was not treated.  CONCLUSIONS:   Aspirin  and Brilinta  12 months  Aggressive risk factor modification  Aggressive blood pressure control to include low-dose beta blocker therapy if heart rate allows.  Anticipate discharge in a.m.  Findings Coronary Findings Diagnostic  Dominance: Co-dominant  Left Anterior Descending  First Diagonal Branch Vessel is small in size.  Second Diagonal Branch Vessel is small in size.  Third Diagonal Branch Vessel is small in size.  Ramus Intermedius Vessel is small.  Left Circumflex The lesion is eccentric.  First Obtuse Marginal Branch  Third Obtuse Marginal Branch Vessel is small in size.  Right Coronary Artery  Intervention  Mid Cx to Dist Cx lesion Angioplasty Lesion length: 20 mm. Lesion crossed with guidewire using a WIRE ASAHI PROWATER 180CM. Pre-stent angioplasty was performed using a BALLOON [place]ER[place] MR 3.0X15. Maximum pressure: 10 atm. A STENT SYNERGY DES 3.5X24 drug eluting stent was successfully placed, and does not overlap previously placed stent. Stent strut is well apposed. Post-stent angioplasty was performed using a BALLOON [place] EUPHORA [id number]. Maximum pressure: 14 atm. The pre-interventional distal flow is decreased (TIMI 2).  The post-interventional distal flow is normal (TIMI 3). The intervention was successful . No  complications occurred at this lesion. Pressure wire/FFR was not performed on the lesion . IVUS was not performed on the lesion . There is a 15% residual stenosis post intervention.   STRESS TESTS  MYOCARDIAL PERFUSION IMAGING 02/04/2021  Interpretation Summary   Findings are consistent with prior myocardial infarction with peri-infarct ischemia. The study is low risk.   No ST deviation was noted.   LV perfusion is abnormal. Defect 1: There is  a medium defect with mild reduction in uptake present in the apical to basal inferolateral location(s) that is partially reversible. There is normal wall motion in the defect area. Consistent with infarction and peri-infarct ischemia.   Left ventricular function is normal. Nuclear stress EF: 55 %. The left ventricular ejection fraction is normal (55-65%). End diastolic cavity size is normal. End systolic cavity size is normal.   Prior study available for comparison from 02/19/2016. There are changes compared to prior study which appear to be improved. The left ventricular ejection fraction has increased. Segmental wall-motion abnormalities have improved.  Compared with the nuclear stress in 2017, systolic function and perfusion have improved.  There is evidence of a small prior infarct with mild peri-infarct ischemia.   ECHOCARDIOGRAM  ECHOCARDIOGRAM COMPLETE 12/16/2023  Narrative ECHOCARDIOGRAM REPORT    Patient Name:   Jeffrey [name]  Date of Exam: 12/16/2023 Medical Rec #:  [id number]     Height:       70.0 in Accession #:    [id number]    Weight:       245.0 lb Date of Birth:  04/27/1956      BSA:          2.275 m Patient Age:    68 years      BP:           174/84 mmHg Patient Gender: M             HR:           59 bpm. Exam Location:  [address]  Procedure: 2D Echo, 3D Echo, Cardiac Doppler, Color Doppler and Strain Analysis (Both Spectral and Color Flow Doppler were utilized during procedure).  Indications:    R07.9 Chest pain CAD  I25.10  History:        Patient has no prior history of Echocardiogram examinations. Previous Myocardial Infarction, CAD and h/o PCIs, LHC 12/15/23 Severe 3v CAD including severe ostial LAD stenosis LVEF 55%; Risk Factors:Hypertension, Dyslipidemia and Former Smoker.  Sonographer:    Nolon Berg BA, RDCS Referring Phys: KARRAR HUSAIN  IMPRESSIONS   1. Left ventricular ejection fraction, by estimation, is 65 to 70%. Left ventricular ejection fraction by 3D volume is 61 %. The left ventricle has normal function. The left ventricle has no regional wall motion abnormalities. There is mild concentric left ventricular hypertrophy. Left ventricular diastolic parameters are consistent with Grade I diastolic dysfunction (impaired relaxation). The average left ventricular global longitudinal strain is -17.8 %. The global longitudinal strain is normal. 2. Right ventricular systolic function is normal. The right ventricular size is normal. 3. Left atrial size was mild to moderately dilated. 4. The mitral valve is abnormal. Trivial mitral valve regurgitation. No evidence of mitral stenosis. 5. The aortic valve is tricuspid. There is mild calcification of the aortic valve. Aortic valve regurgitation is not visualized. Aortic valve sclerosis/calcification is present, without any evidence of aortic stenosis. 6. Aortic dilatation noted. There is mild dilatation of the ascending aorta, measuring 40 mm. 7. The inferior vena cava is normal in size with greater than 50% respiratory variability, suggesting right atrial pressure of 3 mmHg.  FINDINGS Left Ventricle: Left ventricular ejection fraction, by estimation, is 65 to 70%. Left ventricular ejection fraction by 3D volume is 61 %. The left ventricle has normal function. The left ventricle has no regional wall motion abnormalities. The average left ventricular global longitudinal strain is -17.8 %. Strain was performed and the global longitudinal strain is  normal. The left  ventricular internal cavity size was normal in size. There is mild concentric left ventricular hypertrophy. Left ventricular diastolic parameters are consistent with Grade I diastolic dysfunction (impaired relaxation).  Right Ventricle: The right ventricular size is normal. No increase in right ventricular wall thickness. Right ventricular systolic function is normal.  Left Atrium: Left atrial size was mild to moderately dilated.  Right Atrium: Right atrial size was normal in size.  Pericardium: There is no evidence of pericardial effusion.  Mitral Valve: The mitral valve is abnormal. There is mild calcification of the mitral valve leaflet(s). Mild mitral annular calcification. Trivial mitral valve regurgitation. No evidence of mitral valve stenosis.  Tricuspid Valve: The tricuspid valve is normal in structure. Tricuspid valve regurgitation is not demonstrated. No evidence of tricuspid stenosis.  Aortic Valve: The aortic valve is tricuspid. There is mild calcification of the aortic valve. Aortic valve regurgitation is not visualized. Aortic valve sclerosis/calcification is present, without any evidence of aortic stenosis.  Pulmonic Valve: The pulmonic valve was normal in structure. Pulmonic valve regurgitation is trivial. No evidence of pulmonic stenosis.  Aorta: Aortic dilatation noted. There is mild dilatation of the ascending aorta, measuring 40 mm.  Venous: The inferior vena cava is normal in size with greater than 50% respiratory variability, suggesting right atrial pressure of 3 mmHg.  IAS/Shunts: No atrial level shunt detected by color flow Doppler.  Additional Comments: 3D was performed not requiring image post processing on an independent workstation and was normal.   LEFT VENTRICLE PLAX 2D LVIDd:         4.90 cm         Diastology LVIDs:         3.40 cm         LV e' medial:    5.51 cm/s LV PW:         1.20 cm         LV E/e' medial:  18.7 LV IVS:         0.90 cm         LV e' lateral:   4.10 cm/s LVOT diam:     2.30 cm         LV E/e' lateral: 25.2 LV SV:         100 LV SV Index:   44              2D Longitudinal LVOT Area:     4.15 cm        Strain 2D Strain GLS   -17.6 % (A4C): 2D Strain GLS   -17.6 % (A3C): 2D Strain GLS   -18.2 % (A2C): 2D Strain GLS   -17.8 % Avg:  3D Volume EF LV 3D EF:    Left ventricul ar ejection fraction by 3D volume is 61 %.  3D Volume EF: 3D EF:        61 % LV EDV:       139 ml LV ESV:       55 ml LV SV:        84 ml  RIGHT VENTRICLE RV Basal diam:  3.40 cm  LEFT ATRIUM              Index        RIGHT ATRIUM           Index LA diam:        4.10 cm  1.80 cm/m   RA Area:     14.20 cm LA Vol (A2C):  103.0 ml 45.27 ml/m  RA Volume:   32.60 ml  14.33 ml/m LA Vol (A4C):   77.6 ml  34.10 ml/m LA Biplane Vol: 91.9 ml  40.39 ml/m AORTIC VALVE LVOT Vmax:   112.00 cm/s LVOT Vmean:  72.867 cm/s LVOT VTI:    0.240 m  AORTA Ao Root diam: 3.50 cm Ao Asc diam:  3.85 cm  MITRAL VALVE MV Area (PHT): 2.59 cm     SHUNTS MV Decel Time: 293 msec     Systemic VTI:  0.24 m MV E velocity: 103.17 cm/s  Systemic Diam: 2.30 cm MV A velocity: 135.00 cm/s MV E/A ratio:  0.76  Toribio Fuel MD Electronically signed by Toribio Fuel MD Signature Date/Time: 12/16/2023/10:46:01 PM    Final          ______________________________________________________________________________________________     EKG: Sinus brady with 1st degree I have independently reviewed the above radiologic studies and discussed with the patient   Recent Lab Findings: Lab Results  Component Value Date   WBC 5.1 12/09/2023   HGB 13.7 12/09/2023   HCT 42.2 12/09/2023   PLT 237 12/09/2023   GLUCOSE 91 12/09/2023   CHOL 111 10/25/2018   TRIG 91 10/25/2018   HDL 39 (L) 10/25/2018   LDLCALC 54 10/25/2018   ALT 44 10/25/2018   AST 37 10/25/2018   NA 141 12/09/2023   K 4.3 12/09/2023   CL 102 12/09/2023    CREATININE 1.08 12/09/2023   BUN 14 12/09/2023   CO2 22 12/09/2023   TSH 1.400 10/25/2018   INR 1.0 02/19/2016   HGBA1C 5.0 10/25/2018   Intervention    Assessment / Plan:   69 y.o. male with 3V CAD.  Echocardiogram shows preserved biventricular function and no significant valvular disease.  The risks and benefits of CABG 5 were discussed in detail.  The patient is agreeable to proceed.   LAD, OM1-3, PDA     I  spent 40 minutes counseling the patient face to face.   Linnie MALVA Rayas 12/30/2023 3:45 PM

## 2023-12-29 NOTE — Progress Notes (Signed)
 [address]       [place] [address]             [phone]        Jeffrey [name] #[id number] Date of Birth: Oct 20, 1955  Referring: Verlin Lonni BIRCH* Primary Care: Ransom Other, MD Primary Cardiologist:Christopher Verlin, MD  Chief Complaint:    Chief Complaint  Patient presents with   Coronary Artery Disease    New patient consultation, CATH 7/31, ECHO 7/31    History of Present Illness:     Jeffrey [name] is a 68 y.o. 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  He has been symptomatic with exertional angina while playing golf.  He also admits to fatigue, and shortness of breath.  He has a hx of PCI to the circ in 2017   Past Medical and Surgical History: Previous Chest Surgery: no Previous Chest Radiation: no Diabetes Mellitus: no.  HbA1C 5 Creatinine:  Lab Results  Component Value Date   CREATININE 1.08 12/09/2023   CREATININE 1.03 10/25/2018   CREATININE 1.25 04/29/2016     Past Medical History:  Diagnosis Date   Chest wall abscess 2012   lanced it & it's gone   Elevated cholesterol    GERD (gastroesophageal reflux disease)     Past Surgical History:  Procedure Laterality Date   CARDIAC CATHETERIZATION N/A 02/26/2016   Procedure: Left Heart Cath and Coronary Angiography;  Surgeon: Victory LELON Sharps, MD;  Location: [facility] INVASIVE CV LAB;  Service: Cardiovascular;  Laterality: N/A;   CARDIAC CATHETERIZATION N/A 02/26/2016   Procedure: Coronary Stent Intervention;  Surgeon: Victory LELON Sharps, MD;  Location: [facility] INVASIVE CV LAB;  Service: Cardiovascular;  Laterality: N/A;   COLONOSCOPY  2007   COLONOSCOPY WITH PROPOFOL  N/A 02/10/2016   Procedure: COLONOSCOPY WITH PROPOFOL ;  Surgeon: Gladis MARLA Louder, MD;  Location: [facility] ENDOSCOPY;  Service: Endoscopy;  Laterality: N/A;   CORONARY ANGIOPLASTY WITH STENT PLACEMENT  02/26/2016   LEFT HEART CATH AND CORONARY ANGIOGRAPHY N/A 12/15/2023   Procedure: LEFT HEART  CATH AND CORONARY ANGIOGRAPHY;  Surgeon: Verlin Lonni BIRCH, MD;  Location: [facility] INVASIVE CV LAB;  Service: Cardiovascular;  Laterality: N/A;    Social History:  Social History   Tobacco Use  Smoking Status Former   Types: Cigars   Quit date: 01/16/2016   Years since quitting: 7.9  Smokeless Tobacco Never  Tobacco Comments   02/26/2016 might smoke 1/month    Social History   Substance and Sexual Activity  Alcohol Use Yes   Alcohol/week: 18.0 standard drinks of alcohol   Types: 18 Cans of beer per week     Allergies  Allergen Reactions   Brilinta  Anicet.Bandy ] Shortness Of Breath    Medications:  Anti-Coagulation: PLavix   Current Outpatient Medications  Medication Sig Dispense Refill   amLODipine  (NORVASC ) 10 MG tablet Take 1 tablet (10 mg total) by mouth daily. 90 tablet 3   aspirin  EC 81 MG tablet Take 81 mg by mouth daily. Swallow whole.     atorvastatin  (LIPITOR ) 80 MG tablet TAKE ONE TABLET BY MOUTH DAILY AT 6 PM. Please keep upcoming appt in September 2022 with Dr. Sharps before anymore refills. Thank you 90 tablet 0   clopidogrel  (PLAVIX ) 75 MG tablet TAKE 1 TABLET BY MOUTH EVERY DAY 30 tablet 11   Glycerin-Hypromellose-PEG 400 (DRY EYE RELIEF DROPS OP) Place 1 drop into both eyes daily as needed (Dry eyes).     Multiple  Vitamin (MULTIVITAMIN WITH MINERALS) TABS tablet Take 1 tablet by mouth daily.     nitroGLYCERIN  (NITROSTAT ) 0.4 MG SL tablet Place 1 tablet (0.4 mg total) under the tongue every 5 (five) minutes as needed for chest pain. 25 tablet 5   sildenafil (REVATIO) 20 MG tablet Take 40 mg by mouth daily as needed.     No current facility-administered medications for this visit.   Facility-Administered Medications Ordered in Other Visits  Medication Dose Route Frequency Provider Last Rate Last Admin   [facility], Patient & Family Education   Does not apply Once Mili Piltz O, MD        (Not in a hospital admission)   Family  History  Problem Relation Age of Onset   Hypertension Sister    Stroke Brother      Review of Systems:   Review of Systems  Constitutional:  Positive for malaise/fatigue.  Respiratory:  Positive for shortness of breath.   Cardiovascular:  Positive for chest pain.  Neurological: Negative.       Physical Exam: BP (!) 142/75 (BP Location: Left Arm, Patient Position: Sitting, Cuff Size: Normal)   Pulse 61   Resp 20   Ht 5' 10 (1.778 m)   Wt 247 lb 1.6 oz (112.1 kg)   SpO2 96% Comment: RA  BMI 35.46 kg/m  Physical Exam Constitutional:      General: He is not in acute distress.    Appearance: He is not ill-appearing.  HENT:     Head: Normocephalic and atraumatic.  Eyes:     Extraocular Movements: Extraocular movements intact.  Cardiovascular:     Rate and Rhythm: Normal rate.  Pulmonary:     Effort: Pulmonary effort is normal. No respiratory distress.  Abdominal:     General: Abdomen is flat. There is no distension.  Musculoskeletal:        General: Normal range of motion.     Cervical back: Normal range of motion.  Skin:    General: Skin is warm and dry.  Neurological:     General: No focal deficit present.     Mental Status: He is alert and oriented to person, place, and time.       Diagnostic Studies & Laboratory data: Cardiac Studies & Procedures   ______________________________________________________________________________________________ CARDIAC CATHETERIZATION  CARDIAC CATHETERIZATION 12/15/2023  Conclusion   Prox RCA lesion is 70% stenosed.   Mid RCA lesion is 60% stenosed.   1st Mrg lesion is 80% stenosed.   2nd Mrg lesion is 80% stenosed.   Mid Cx to Dist Cx lesion is 90% stenosed.   Prox Cx lesion is 20% stenosed.   Ost LAD to Prox LAD lesion is 90% stenosed.   Mid LAD lesion is 60% stenosed.   The left ventricular ejection fraction is 50-55% by visual estimate.  Severe ostial LAD stenosis. Moderate mid LAD stenosis. Patent proximal  Circumflex stent with minimal restenosis. Severe stenosis in the ostium of the first obtuse marginal branch (jailed by the stent). Severe ostial stenosis second obtuse marginal branch. Severe stenosis distal AV groove Circumflex involving the third obtuse marginal branch. Large dominant RCA with moderately severe mid vessel stenosis Normal LV systolic function.  Recommendations: He has severe three vessel CAD including ostial LAD stenosis. Will refer to CT surgery for CABG as outpatient.  Findings Coronary Findings Diagnostic  Dominance: Right  Left Anterior Descending Vessel is large. Ost LAD to Prox LAD lesion is 90% stenosed. Mid LAD lesion is 60%  stenosed.  Left Circumflex Vessel is large. Prox Cx lesion is 20% stenosed. The lesion was previously treated using a drug eluting stent over 2 years ago. Mid Cx to Dist Cx lesion is 90% stenosed.  First Obtuse Marginal Branch 1st Mrg lesion is 80% stenosed.  Second Obtuse Marginal Branch 2nd Mrg lesion is 80% stenosed.  Right Coronary Artery Vessel is large. Prox RCA lesion is 70% stenosed. Mid RCA lesion is 60% stenosed.  Intervention  No interventions have been documented.   CARDIAC CATHETERIZATION  CARDIAC CATHETERIZATION 02/26/2016  Conclusion  Prox RCA lesion, 50 %stenosed.  Mid RCA to Dist RCA lesion, 30 %stenosed.  1st Mrg lesion, 65 %stenosed.  Prox LAD to Dist LAD lesion, 30 %stenosed.  Dist LAD lesion, 60 %stenosed.  Ost LAD to Prox LAD lesion, 50 %stenosed.  The left ventricular systolic function is normal.  A STENT SYNERGY DES 3.5X24 drug eluting stent was successfully placed, and does not overlap previously placed stent.  Mid Cx to Dist Cx lesion, 99 %stenosed.  Post intervention, there is a 15% residual stenosis.  Ost 1st Mrg to 1st Mrg lesion, 85 %stenosed.   Moderate to moderately severe diffuse coronary artery disease involving the entire right coronary artery, and the left anterior  descending with blockages up to 50% in multiple areas.  Severe involvement of the circumflex artery with 99% mid vessel stenosis, thrombus, and TIMI grade 2 flow. The first obtuse marginal contains ostial 70% stenosis.  Inferobasal hypokinesis. EF 55%.  Successful PCI with angioplasty and stenting of the mid circumflex with a 24 x 3.5 mm Synergy drug-eluting stent postdilated to 3.75 mm in diameter. Less than 20% stenosis was noted throughout the stent.  The proximal margin of the stent slightly overlap the ostium of the first obtuse marginal had 85% stenosis was noted post stent deployment. This branch was not treated.  CONCLUSIONS:   Aspirin  and Brilinta  12 months  Aggressive risk factor modification  Aggressive blood pressure control to include low-dose beta blocker therapy if heart rate allows.  Anticipate discharge in a.m.  Findings Coronary Findings Diagnostic  Dominance: Co-dominant  Left Anterior Descending  First Diagonal Branch Vessel is small in size.  Second Diagonal Branch Vessel is small in size.  Third Diagonal Branch Vessel is small in size.  Ramus Intermedius Vessel is small.  Left Circumflex The lesion is eccentric.  First Obtuse Marginal Branch  Third Obtuse Marginal Branch Vessel is small in size.  Right Coronary Artery  Intervention  Mid Cx to Dist Cx lesion Angioplasty Lesion length: 20 mm. Lesion crossed with guidewire using a WIRE ASAHI PROWATER 180CM. Pre-stent angioplasty was performed using a BALLOON EMERGE MR 3.0X15. Maximum pressure: 10 atm. A STENT SYNERGY DES 3.5X24 drug eluting stent was successfully placed, and does not overlap previously placed stent. Stent strut is well apposed. Post-stent angioplasty was performed using a BALLOON [place] EUPHORA [id number]. Maximum pressure: 14 atm. The pre-interventional distal flow is decreased (TIMI 2).  The post-interventional distal flow is normal (TIMI 3). The intervention was successful . No  complications occurred at this lesion. Pressure wire/FFR was not performed on the lesion . IVUS was not performed on the lesion . There is a 15% residual stenosis post intervention.   STRESS TESTS  MYOCARDIAL PERFUSION IMAGING 02/04/2021  Interpretation Summary   Findings are consistent with prior myocardial infarction with peri-infarct ischemia. The study is low risk.   No ST deviation was noted.   LV perfusion is abnormal. Defect 1: There is  a medium defect with mild reduction in uptake present in the apical to basal inferolateral location(s) that is partially reversible. There is normal wall motion in the defect area. Consistent with infarction and peri-infarct ischemia.   Left ventricular function is normal. Nuclear stress EF: 55 %. The left ventricular ejection fraction is normal (55-65%). End diastolic cavity size is normal. End systolic cavity size is normal.   Prior study available for comparison from 02/19/2016. There are changes compared to prior study which appear to be improved. The left ventricular ejection fraction has increased. Segmental wall-motion abnormalities have improved.  Compared with the nuclear stress in 2017, systolic function and perfusion have improved.  There is evidence of a small prior infarct with mild peri-infarct ischemia.   ECHOCARDIOGRAM  ECHOCARDIOGRAM COMPLETE 12/16/2023  Narrative ECHOCARDIOGRAM REPORT    Patient Name:   Jeffrey [name]  Date of Exam: 12/16/2023 Medical Rec #:  [id number]     Height:       70.0 in Accession #:    [id number]    Weight:       245.0 lb Date of Birth:  [birth year]      BSA:          2.275 m Patient Age:    68 years      BP:           174/84 mmHg Patient Gender: M             HR:           59 bpm. Exam Location:  [address]  Procedure: 2D Echo, 3D Echo, Cardiac Doppler, Color Doppler and Strain Analysis (Both Spectral and Color Flow Doppler were utilized during procedure).  Indications:    R07.9 Chest pain CAD  I25.10  History:        Patient has no prior history of Echocardiogram examinations. Previous Myocardial Infarction, CAD and h/o PCIs, LHC 12/15/23 Severe 3v CAD including severe ostial LAD stenosis LVEF 55%; Risk Factors:Hypertension, Dyslipidemia and Former Smoker.  Sonographer:    Nolon Berg BA, RDCS Referring Phys: KARRAR HUSAIN  IMPRESSIONS   1. Left ventricular ejection fraction, by estimation, is 65 to 70%. Left ventricular ejection fraction by 3D volume is 61 %. The left ventricle has normal function. The left ventricle has no regional wall motion abnormalities. There is mild concentric left ventricular hypertrophy. Left ventricular diastolic parameters are consistent with Grade I diastolic dysfunction (impaired relaxation). The average left ventricular global longitudinal strain is -17.8 %. The global longitudinal strain is normal. 2. Right ventricular systolic function is normal. The right ventricular size is normal. 3. Left atrial size was mild to moderately dilated. 4. The mitral valve is abnormal. Trivial mitral valve regurgitation. No evidence of mitral stenosis. 5. The aortic valve is tricuspid. There is mild calcification of the aortic valve. Aortic valve regurgitation is not visualized. Aortic valve sclerosis/calcification is present, without any evidence of aortic stenosis. 6. Aortic dilatation noted. There is mild dilatation of the ascending aorta, measuring 40 mm. 7. The inferior vena cava is normal in size with greater than 50% respiratory variability, suggesting right atrial pressure of 3 mmHg.  FINDINGS Left Ventricle: Left ventricular ejection fraction, by estimation, is 65 to 70%. Left ventricular ejection fraction by 3D volume is 61 %. The left ventricle has normal function. The left ventricle has no regional wall motion abnormalities. The average left ventricular global longitudinal strain is -17.8 %. Strain was performed and the global longitudinal strain is  normal. The left  ventricular internal cavity size was normal in size. There is mild concentric left ventricular hypertrophy. Left ventricular diastolic parameters are consistent with Grade I diastolic dysfunction (impaired relaxation).  Right Ventricle: The right ventricular size is normal. No increase in right ventricular wall thickness. Right ventricular systolic function is normal.  Left Atrium: Left atrial size was mild to moderately dilated.  Right Atrium: Right atrial size was normal in size.  Pericardium: There is no evidence of pericardial effusion.  Mitral Valve: The mitral valve is abnormal. There is mild calcification of the mitral valve leaflet(s). Mild mitral annular calcification. Trivial mitral valve regurgitation. No evidence of mitral valve stenosis.  Tricuspid Valve: The tricuspid valve is normal in structure. Tricuspid valve regurgitation is not demonstrated. No evidence of tricuspid stenosis.  Aortic Valve: The aortic valve is tricuspid. There is mild calcification of the aortic valve. Aortic valve regurgitation is not visualized. Aortic valve sclerosis/calcification is present, without any evidence of aortic stenosis.  Pulmonic Valve: The pulmonic valve was normal in structure. Pulmonic valve regurgitation is trivial. No evidence of pulmonic stenosis.  Aorta: Aortic dilatation noted. There is mild dilatation of the ascending aorta, measuring 40 mm.  Venous: The inferior vena cava is normal in size with greater than 50% respiratory variability, suggesting right atrial pressure of 3 mmHg.  IAS/Shunts: No atrial level shunt detected by color flow Doppler.  Additional Comments: 3D was performed not requiring image post processing on an independent workstation and was normal.   LEFT VENTRICLE PLAX 2D LVIDd:         4.90 cm         Diastology LVIDs:         3.40 cm         LV e' medial:    5.51 cm/s LV PW:         1.20 cm         LV E/e' medial:  18.7 LV IVS:         0.90 cm         LV e' lateral:   4.10 cm/s LVOT diam:     2.30 cm         LV E/e' lateral: 25.2 LV SV:         100 LV SV Index:   44              2D Longitudinal LVOT Area:     4.15 cm        Strain 2D Strain GLS   -17.6 % (A4C): 2D Strain GLS   -17.6 % (A3C): 2D Strain GLS   -18.2 % (A2C): 2D Strain GLS   -17.8 % Avg:  3D Volume EF LV 3D EF:    Left ventricul ar ejection fraction by 3D volume is 61 %.  3D Volume EF: 3D EF:        61 % LV EDV:       139 ml LV ESV:       55 ml LV SV:        84 ml  RIGHT VENTRICLE RV Basal diam:  3.40 cm  LEFT ATRIUM              Index        RIGHT ATRIUM           Index LA diam:        4.10 cm  1.80 cm/m   RA Area:     14.20 cm LA Vol (A2C):  103.0 ml 45.27 ml/m  RA Volume:   32.60 ml  14.33 ml/m LA Vol (A4C):   77.6 ml  34.10 ml/m LA Biplane Vol: 91.9 ml  40.39 ml/m AORTIC VALVE LVOT Vmax:   112.00 cm/s LVOT Vmean:  72.867 cm/s LVOT VTI:    0.240 m  AORTA Ao Root diam: 3.50 cm Ao Asc diam:  3.85 cm  MITRAL VALVE MV Area (PHT): 2.59 cm     SHUNTS MV Decel Time: 293 msec     Systemic VTI:  0.24 m MV E velocity: 103.17 cm/s  Systemic Diam: 2.30 cm MV A velocity: 135.00 cm/s MV E/A ratio:  0.76  Toribio Fuel MD Electronically signed by Toribio Fuel MD Signature Date/Time: 12/16/2023/10:46:01 PM    Final          ______________________________________________________________________________________________     EKG: Sinus brady with 1st degree I have independently reviewed the above radiologic studies and discussed with the patient   Recent Lab Findings: Lab Results  Component Value Date   WBC 5.1 12/09/2023   HGB 13.7 12/09/2023   HCT 42.2 12/09/2023   PLT 237 12/09/2023   GLUCOSE 91 12/09/2023   CHOL 111 10/25/2018   TRIG 91 10/25/2018   HDL 39 (L) 10/25/2018   LDLCALC 54 10/25/2018   ALT 44 10/25/2018   AST 37 10/25/2018   NA 141 12/09/2023   K 4.3 12/09/2023   CL 102 12/09/2023    CREATININE 1.08 12/09/2023   BUN 14 12/09/2023   CO2 22 12/09/2023   TSH 1.400 10/25/2018   INR 1.0 02/19/2016   HGBA1C 5.0 10/25/2018   Intervention    Assessment / Plan:   68 y.o. male with 3V CAD.  Echocardiogram shows preserved biventricular function and no significant valvular disease.  The risks and benefits of CABG 5 were discussed in detail.  The patient is agreeable to proceed.   LAD, OM1-3, PDA     I  spent 40 minutes counseling the patient face to face.   Linnie MALVA Rayas 12/30/2023 3:45 PM

## 2023-12-30 ENCOUNTER — Other Ambulatory Visit: Payer: Self-pay | Admitting: *Deleted

## 2023-12-30 ENCOUNTER — Encounter: Payer: Self-pay | Admitting: *Deleted

## 2023-12-30 ENCOUNTER — Ambulatory Visit
Attending: Thoracic Surgery (Cardiothoracic Vascular Surgery) | Admitting: Thoracic Surgery (Cardiothoracic Vascular Surgery)

## 2023-12-30 ENCOUNTER — Encounter: Payer: Self-pay | Admitting: Thoracic Surgery (Cardiothoracic Vascular Surgery)

## 2023-12-30 VITALS — BP 142/75 | HR 61 | Resp 20 | Ht 70.0 in | Wt 247.1 lb

## 2023-12-30 DIAGNOSIS — I25119 Atherosclerotic heart disease of native coronary artery with unspecified angina pectoris: Secondary | ICD-10-CM

## 2023-12-30 MED ORDER — ~~LOC~~ CARDIAC SURGERY, PATIENT & FAMILY EDUCATION: Freq: Once | Status: AC

## 2024-01-02 ENCOUNTER — Encounter: Payer: Self-pay | Admitting: *Deleted

## 2024-01-10 ENCOUNTER — Encounter (HOSPITAL_COMMUNITY): Payer: Self-pay

## 2024-01-10 NOTE — Pre-Procedure Instructions (Signed)
 Surgical Instructions   Your procedure is scheduled on January 12, 2024. Report to [facility] Main Entrance A at 5:30 A.M., then check in with the Admitting office. Any questions or running late day of surgery: call [phone]  Questions prior to your surgery date: call [phone], Monday-Friday, 8am-4pm. If you experience any cold or flu symptoms such as cough, fever, chills, shortness of breath, etc. between now and your scheduled surgery, please notify us  at the above number.     Remember:  Do not eat or drink after midnight the night before your surgery    Take these medicines the morning of surgery with A SIP OF WATER : amLODipine  (NORVASC )    May take these medicines IF NEEDED: Glycerin-Hypromellose-PEG 400 (DRY EYE RELIEF DROPS OP) eye drops nitroGLYCERIN  (NITROSTAT ) - if dose taken prior to surgery, please call [phone]   STOP taking your clopidogrel  (PLAVIX ) one week prior to surgery. Your last dose will be August 20th.  Continue taking your Aspirin  through the day before surgery. DO NOT take any the morning of surgery   One week prior to surgery, STOP taking any Aleve, Naproxen, Ibuprofen, Motrin, Advil, Goody's, BC's, all herbal medications, fish oil, and non-prescription vitamins.                     Do NOT Smoke (Tobacco/Vaping) for 24 hours prior to your procedure.  If you use a CPAP at night, you may bring your mask/headgear for your overnight stay.   You will be asked to remove any contacts, glasses, piercing's, hearing aid's, dentures/partials prior to surgery. Please bring cases for these items if needed.    Patients discharged the day of surgery will not be allowed to drive home, and someone needs to stay with them for 24 hours.  SURGICAL WAITING ROOM VISITATION Patients may have no more than 2 support people in the waiting area - these visitors may rotate.   Pre-op nurse will coordinate an appropriate time for 1 ADULT support person, who may not  rotate, to accompany patient in pre-op.  Children under the age of 33 must have an adult with them who is not the patient and must remain in the main waiting area with an adult.  If the patient needs to stay at the hospital during part of their recovery, the visitor guidelines for inpatient rooms apply.  Please refer to the [facility] website for the visitor guidelines for any additional information.   If you received a COVID test during your pre-op visit  it is requested that you wear a mask when out in public, stay away from anyone that may not be feeling well and notify your surgeon if you develop symptoms. If you have been in contact with anyone that has tested positive in the last 10 days please notify you surgeon.      Pre-operative CHG Bathing Instructions   You can play a key role in reducing the risk of infection after surgery. Your skin needs to be as free of germs as possible. You can reduce the number of germs on your skin by washing with CHG (chlorhexidine  gluconate) soap before surgery. CHG is an antiseptic soap that kills germs and continues to kill germs even after washing.   DO NOT use if you have an allergy to chlorhexidine /CHG or antibacterial soaps. If your skin becomes reddened or irritated, stop using the CHG and notify one of our RNs at [phone].  TAKE A SHOWER THE NIGHT BEFORE SURGERY AND THE DAY OF SURGERY    Please keep in mind the following:  DO NOT shave, including legs and underarms, 48 hours prior to surgery.   You may shave your face before/day of surgery.  Place clean sheets on your bed the night before surgery Use a clean washcloth (not used since being washed) for each shower. DO NOT sleep with pet's night before surgery.  CHG Shower Instructions:  Wash your face and private area with normal soap. If you choose to wash your hair, wash first with your normal shampoo.  After you use shampoo/soap, rinse your hair and body thoroughly to  remove shampoo/soap residue.  Turn the water  OFF and apply half the bottle of CHG soap to a CLEAN washcloth.  Apply CHG soap ONLY FROM YOUR NECK DOWN TO YOUR TOES (washing for 3-5 minutes)  DO NOT use CHG soap on face, private areas, open wounds, or sores.  Pay special attention to the area where your surgery is being performed.  If you are having back surgery, having someone wash your back for you may be helpful. Wait 2 minutes after CHG soap is applied, then you may rinse off the CHG soap.  Pat dry with a clean towel  Put on clean pajamas    Additional instructions for the day of surgery: DO NOT APPLY any lotions, deodorants, cologne, or perfumes.   Do not wear jewelry or makeup Do not wear nail polish, gel polish, artificial nails, or any other type of covering on natural nails (fingers and toes) Do not bring valuables to the hospital. [facility] is not responsible for valuables/personal belongings. Put on clean/comfortable clothes.  Please brush your teeth.  Ask your nurse before applying any prescription medications to the skin.

## 2024-01-11 ENCOUNTER — Other Ambulatory Visit: Payer: Self-pay

## 2024-01-11 ENCOUNTER — Inpatient Hospital Stay (HOSPITAL_COMMUNITY)
Admission: RE | Admit: 2024-01-11 | Discharge: 2024-01-11 | Disposition: A | Discharge status: Home / Self Care | Source: Ambulatory Visit | Attending: Thoracic Surgery (Cardiothoracic Vascular Surgery)

## 2024-01-11 ENCOUNTER — Ambulatory Visit (HOSPITAL_BASED_OUTPATIENT_CLINIC_OR_DEPARTMENT_OTHER)
Admission: RE | Admit: 2024-01-11 | Discharge: 2024-01-11 | Disposition: A | Discharge status: Home / Self Care | Source: Ambulatory Visit | Attending: Thoracic Surgery (Cardiothoracic Vascular Surgery) | Admitting: Thoracic Surgery (Cardiothoracic Vascular Surgery)

## 2024-01-11 ENCOUNTER — Ambulatory Visit (HOSPITAL_COMMUNITY)
Admission: RE | Admit: 2024-01-11 | Discharge: 2024-01-11 | Disposition: A | Discharge status: Home / Self Care | Source: Ambulatory Visit | Attending: Thoracic Surgery (Cardiothoracic Vascular Surgery) | Admitting: Thoracic Surgery (Cardiothoracic Vascular Surgery)

## 2024-01-11 ENCOUNTER — Encounter (HOSPITAL_COMMUNITY): Payer: Self-pay

## 2024-01-11 VITALS — BP 144/63 | HR 50 | Temp 98.3°F | Resp 19 | Ht 70.0 in | Wt 243.0 lb

## 2024-01-11 DIAGNOSIS — Z01818 Encounter for other preprocedural examination: Secondary | ICD-10-CM | POA: Diagnosis not present

## 2024-01-11 DIAGNOSIS — K567 Ileus, unspecified: Secondary | ICD-10-CM | POA: Diagnosis not present

## 2024-01-11 DIAGNOSIS — I25119 Atherosclerotic heart disease of native coronary artery with unspecified angina pectoris: Secondary | ICD-10-CM | POA: Insufficient documentation

## 2024-01-11 DIAGNOSIS — Z951 Presence of aortocoronary bypass graft: Secondary | ICD-10-CM | POA: Diagnosis not present

## 2024-01-11 DIAGNOSIS — R0989 Other specified symptoms and signs involving the circulatory and respiratory systems: Secondary | ICD-10-CM | POA: Diagnosis not present

## 2024-01-11 DIAGNOSIS — I808 Phlebitis and thrombophlebitis of other sites: Secondary | ICD-10-CM | POA: Diagnosis not present

## 2024-01-11 DIAGNOSIS — Z79899 Other long term (current) drug therapy: Secondary | ICD-10-CM | POA: Diagnosis not present

## 2024-01-11 DIAGNOSIS — I480 Paroxysmal atrial fibrillation: Secondary | ICD-10-CM | POA: Diagnosis not present

## 2024-01-11 DIAGNOSIS — I2510 Atherosclerotic heart disease of native coronary artery without angina pectoris: Secondary | ICD-10-CM | POA: Diagnosis not present

## 2024-01-11 DIAGNOSIS — J90 Pleural effusion, not elsewhere classified: Secondary | ICD-10-CM | POA: Diagnosis not present

## 2024-01-11 DIAGNOSIS — I517 Cardiomegaly: Secondary | ICD-10-CM | POA: Diagnosis not present

## 2024-01-11 DIAGNOSIS — Z7901 Long term (current) use of anticoagulants: Secondary | ICD-10-CM | POA: Diagnosis not present

## 2024-01-11 DIAGNOSIS — Z7982 Long term (current) use of aspirin: Secondary | ICD-10-CM | POA: Diagnosis not present

## 2024-01-11 DIAGNOSIS — K529 Noninfective gastroenteritis and colitis, unspecified: Secondary | ICD-10-CM | POA: Diagnosis not present

## 2024-01-11 DIAGNOSIS — Z48812 Encounter for surgical aftercare following surgery on the circulatory system: Secondary | ICD-10-CM | POA: Diagnosis not present

## 2024-01-11 DIAGNOSIS — K219 Gastro-esophageal reflux disease without esophagitis: Secondary | ICD-10-CM | POA: Diagnosis not present

## 2024-01-11 DIAGNOSIS — Z8249 Family history of ischemic heart disease and other diseases of the circulatory system: Secondary | ICD-10-CM | POA: Diagnosis not present

## 2024-01-11 DIAGNOSIS — C179 Malignant neoplasm of small intestine, unspecified: Secondary | ICD-10-CM | POA: Diagnosis not present

## 2024-01-11 DIAGNOSIS — E7800 Pure hypercholesterolemia, unspecified: Secondary | ICD-10-CM | POA: Diagnosis not present

## 2024-01-11 DIAGNOSIS — Z9889 Other specified postprocedural states: Secondary | ICD-10-CM | POA: Diagnosis not present

## 2024-01-11 DIAGNOSIS — K5289 Other specified noninfective gastroenteritis and colitis: Secondary | ICD-10-CM | POA: Diagnosis not present

## 2024-01-11 DIAGNOSIS — Z7902 Long term (current) use of antithrombotics/antiplatelets: Secondary | ICD-10-CM | POA: Diagnosis not present

## 2024-01-11 DIAGNOSIS — I252 Old myocardial infarction: Secondary | ICD-10-CM | POA: Diagnosis not present

## 2024-01-11 DIAGNOSIS — Z955 Presence of coronary angioplasty implant and graft: Secondary | ICD-10-CM | POA: Diagnosis not present

## 2024-01-11 DIAGNOSIS — R140 Abdominal distension (gaseous): Secondary | ICD-10-CM | POA: Diagnosis not present

## 2024-01-11 DIAGNOSIS — Y838 Other surgical procedures as the cause of abnormal reaction of the patient, or of later complication, without mention of misadventure at the time of the procedure: Secondary | ICD-10-CM | POA: Diagnosis not present

## 2024-01-11 DIAGNOSIS — K56609 Unspecified intestinal obstruction, unspecified as to partial versus complete obstruction: Secondary | ICD-10-CM | POA: Diagnosis not present

## 2024-01-11 DIAGNOSIS — Z720 Tobacco use: Secondary | ICD-10-CM | POA: Diagnosis not present

## 2024-01-11 DIAGNOSIS — R739 Hyperglycemia, unspecified: Secondary | ICD-10-CM | POA: Diagnosis not present

## 2024-01-11 DIAGNOSIS — I10 Essential (primary) hypertension: Secondary | ICD-10-CM | POA: Diagnosis not present

## 2024-01-11 DIAGNOSIS — E785 Hyperlipidemia, unspecified: Secondary | ICD-10-CM | POA: Diagnosis not present

## 2024-01-11 DIAGNOSIS — J9811 Atelectasis: Secondary | ICD-10-CM | POA: Diagnosis not present

## 2024-01-11 DIAGNOSIS — H04123 Dry eye syndrome of bilateral lacrimal glands: Secondary | ICD-10-CM | POA: Diagnosis not present

## 2024-01-11 DIAGNOSIS — D62 Acute posthemorrhagic anemia: Secondary | ICD-10-CM | POA: Diagnosis not present

## 2024-01-11 DIAGNOSIS — Z823 Family history of stroke: Secondary | ICD-10-CM | POA: Diagnosis not present

## 2024-01-11 DIAGNOSIS — I471 Supraventricular tachycardia, unspecified: Secondary | ICD-10-CM | POA: Diagnosis not present

## 2024-01-11 DIAGNOSIS — D65 Disseminated intravascular coagulation [defibrination syndrome]: Secondary | ICD-10-CM | POA: Diagnosis not present

## 2024-01-11 DIAGNOSIS — Z87891 Personal history of nicotine dependence: Secondary | ICD-10-CM | POA: Diagnosis not present

## 2024-01-11 DIAGNOSIS — E876 Hypokalemia: Secondary | ICD-10-CM | POA: Diagnosis not present

## 2024-01-11 DIAGNOSIS — Z4682 Encounter for fitting and adjustment of non-vascular catheter: Secondary | ICD-10-CM | POA: Diagnosis not present

## 2024-01-11 HISTORY — DX: Atherosclerotic heart disease of native coronary artery without angina pectoris: I25.10

## 2024-01-11 LAB — URINALYSIS, ROUTINE W REFLEX MICROSCOPIC
Bilirubin Urine: NEGATIVE
Glucose, UA: NEGATIVE mg/dL
Hgb urine dipstick: NEGATIVE
Ketones, ur: NEGATIVE mg/dL
Leukocytes,Ua: NEGATIVE
Nitrite: NEGATIVE
Protein, ur: NEGATIVE mg/dL
Specific Gravity, Urine: 1.011 (ref 1.005–1.030)
pH: 7 (ref 5.0–8.0)

## 2024-01-11 LAB — SURGICAL PCR SCREEN
MRSA, PCR: NEGATIVE
Staphylococcus aureus: NEGATIVE

## 2024-01-11 LAB — CBC
HCT: 43.8 % (ref 39.0–52.0)
Hemoglobin: 14.2 g/dL (ref 13.0–17.0)
MCH: 32.1 pg (ref 26.0–34.0)
MCHC: 32.4 g/dL (ref 30.0–36.0)
MCV: 99.1 fL (ref 80.0–100.0)
Platelets: 222 K/uL (ref 150–400)
RBC: 4.42 MIL/uL (ref 4.22–5.81)
RDW: 13.2 % (ref 11.5–15.5)
WBC: 5.3 K/uL (ref 4.0–10.5)
nRBC: 0 % (ref 0.0–0.2)

## 2024-01-11 LAB — TYPE AND SCREEN
ABO/RH(D): A POS
Antibody Screen: NEGATIVE

## 2024-01-11 LAB — COMPREHENSIVE METABOLIC PANEL WITH GFR
ALT: 33 U/L (ref 0–44)
AST: 35 U/L (ref 15–41)
Albumin: 3.8 g/dL (ref 3.5–5.0)
Alkaline Phosphatase: 57 U/L (ref 38–126)
Anion gap: 7 (ref 5–15)
BUN: 17 mg/dL (ref 8–23)
CO2: 27 mmol/L (ref 22–32)
Calcium: 9.3 mg/dL (ref 8.9–10.3)
Chloride: 102 mmol/L (ref 98–111)
Creatinine, Ser: 1.07 mg/dL (ref 0.61–1.24)
GFR, Estimated: >60 mL/min (ref >60)
Glucose, Bld: 90 mg/dL (ref 70–99)
Potassium: 3.7 mmol/L (ref 3.5–5.1)
Sodium: 136 mmol/L (ref 135–145)
Total Bilirubin: 0.9 mg/dL (ref 0.0–1.2)
Total Protein: 7.2 g/dL (ref 6.5–8.1)

## 2024-01-11 LAB — APTT: aPTT: 32 s (ref 24–36)

## 2024-01-11 LAB — PROTIME-INR
INR: 1 (ref 0.8–1.2)
Prothrombin Time: 13.9 s (ref 11.4–15.2)

## 2024-01-11 MED ORDER — CEFAZOLIN SODIUM-DEXTROSE 2-4 GM/100ML-% IV SOLN
2.0000 g | INTRAVENOUS | Status: AC
  Administered 2024-01-12: 2 g via INTRAVENOUS
  Filled 2024-01-11: qty 100

## 2024-01-11 MED ORDER — TRANEXAMIC ACID (OHS) BOLUS VIA INFUSION
15.0000 mg/kg | INTRAVENOUS | Status: AC
  Administered 2024-01-12: 1653 mg via INTRAVENOUS
  Filled 2024-01-11: qty 1653

## 2024-01-11 MED ORDER — PLASMA-LYTE A IV SOLN
INTRAVENOUS | Status: DC
  Filled 2024-01-11: qty 2.5

## 2024-01-11 MED ORDER — POTASSIUM CHLORIDE 2 MEQ/ML IV SOLN
80.0000 meq | INTRAVENOUS | Status: DC
  Filled 2024-01-11: qty 40

## 2024-01-11 MED ORDER — EPINEPHRINE HCL 5 MG/250ML IV SOLN IN NS
0.0000 ug/min | INTRAVENOUS | Status: DC
  Filled 2024-01-11: qty 250

## 2024-01-11 MED ORDER — TRANEXAMIC ACID 1000 MG/10ML IV SOLN
1.5000 mg/kg/h | INTRAVENOUS | Status: AC
  Administered 2024-01-12 (×2): 1.5 mg/kg/h via INTRAVENOUS
  Filled 2024-01-11: qty 25
  Filled 2024-01-11: qty 10

## 2024-01-11 MED ORDER — DEXMEDETOMIDINE HCL IN NACL 400 MCG/100ML IV SOLN
0.1000 ug/kg/h | INTRAVENOUS | Status: AC
  Administered 2024-01-12: .7 ug/kg/h via INTRAVENOUS
  Filled 2024-01-11: qty 100

## 2024-01-11 MED ORDER — TRANEXAMIC ACID (OHS) PUMP PRIME SOLUTION
2.0000 mg/kg | INTRAVENOUS | Status: DC
  Filled 2024-01-11: qty 2.2

## 2024-01-11 MED ORDER — INSULIN REGULAR(HUMAN) IN NACL 100-0.9 UT/100ML-% IV SOLN
INTRAVENOUS | Status: AC
  Administered 2024-01-12: 1.9 [IU]/h via INTRAVENOUS
  Filled 2024-01-11: qty 100

## 2024-01-11 MED ORDER — MILRINONE LACTATE IN DEXTROSE 20-5 MG/100ML-% IV SOLN
0.3000 ug/kg/min | INTRAVENOUS | Status: DC
  Filled 2024-01-11: qty 100

## 2024-01-11 MED ORDER — NITROGLYCERIN IN D5W 200-5 MCG/ML-% IV SOLN
2.0000 ug/min | INTRAVENOUS | Status: AC
  Administered 2024-01-12: 25 ug/min via INTRAVENOUS
  Filled 2024-01-11: qty 250

## 2024-01-11 MED ORDER — HEPARIN 30,000 UNITS/1000 ML (OHS) CELLSAVER SOLUTION
Status: DC
  Filled 2024-01-11: qty 1000

## 2024-01-11 MED ORDER — MANNITOL 20 % IV SOLN
INTRAVENOUS | Status: DC
  Filled 2024-01-11: qty 13

## 2024-01-11 MED ORDER — VANCOMYCIN HCL 1.5 G IV SOLR
1500.0000 mg | INTRAVENOUS | Status: AC
  Administered 2024-01-12: 1500 mg via INTRAVENOUS
  Filled 2024-01-11: qty 30

## 2024-01-11 MED ORDER — PHENYLEPHRINE HCL-NACL 20-0.9 MG/250ML-% IV SOLN
30.0000 ug/min | INTRAVENOUS | Status: AC
  Administered 2024-01-12: 10 ug/min via INTRAVENOUS
  Filled 2024-01-11: qty 250

## 2024-01-11 MED ORDER — NOREPINEPHRINE 4 MG/250ML-% IV SOLN
0.0000 ug/min | INTRAVENOUS | Status: DC
  Filled 2024-01-11: qty 250

## 2024-01-11 NOTE — Progress Notes (Signed)
 PCP - Dr. Ardell Manly Cardiologist - Verlin, Lonni D LOV 12/09/23; F/U in 6-8 weeks.  PPM/ICD - Denies Device Orders - n/a Rep Notified - n/a  Chest x-ray - 01/11/24 EKG - 01/11/24 Stress Test - 02-04-21 ECHO - 12-16-23 Cardiac Cath - 12-15-23  Sleep Study - Denies CPAP - n/a  NON-diabetic  Last dose of GLP1 agonist-  Denies GLP1 instructions: n/a  Blood Thinner Instructions: Plavix  - per patient, physician told to stop on 01/05/24 Aspirin  Instructions: Last dose on 01-11-24  ERAS Protcol - NPO PRE-SURGERY Ensure or G2- none  COVID TEST- n/a   Anesthesia review: Yes, CAD, heart cath on 12/15/23  Patient denies shortness of breath, fever, cough and chest pain at PAT appointment. Patient denies any respiratory issues at this time.    All instructions explained to the patient, with a verbal understanding of the material. Patient agrees to go over the instructions while at home for a better understanding. Patient also instructed to self quarantine after being tested for COVID-19. The opportunity to ask questions was provided.

## 2024-01-12 ENCOUNTER — Inpatient Hospital Stay (HOSPITAL_COMMUNITY)
Admission: RE | Admit: 2024-01-12 | Discharge: 2024-01-21 | DRG: 235 | Disposition: A | Discharge status: Home / Self Care | Attending: Thoracic Surgery (Cardiothoracic Vascular Surgery) | Admitting: Thoracic Surgery (Cardiothoracic Vascular Surgery)

## 2024-01-12 ENCOUNTER — Inpatient Hospital Stay (HOSPITAL_COMMUNITY): Payer: Self-pay | Admitting: Certified Registered Nurse Anesthetist

## 2024-01-12 ENCOUNTER — Other Ambulatory Visit: Payer: Self-pay

## 2024-01-12 ENCOUNTER — Inpatient Hospital Stay (HOSPITAL_COMMUNITY)
Admission: RE | Disposition: A | Discharge status: Home / Self Care | Payer: Self-pay | Source: Home / Self Care | Attending: Thoracic Surgery (Cardiothoracic Vascular Surgery)

## 2024-01-12 ENCOUNTER — Inpatient Hospital Stay (HOSPITAL_COMMUNITY)

## 2024-01-12 ENCOUNTER — Encounter (HOSPITAL_COMMUNITY): Payer: Self-pay | Admitting: Thoracic Surgery (Cardiothoracic Vascular Surgery)

## 2024-01-12 DIAGNOSIS — R739 Hyperglycemia, unspecified: Secondary | ICD-10-CM | POA: Diagnosis present

## 2024-01-12 DIAGNOSIS — Z7982 Long term (current) use of aspirin: Secondary | ICD-10-CM | POA: Diagnosis not present

## 2024-01-12 DIAGNOSIS — Z8249 Family history of ischemic heart disease and other diseases of the circulatory system: Secondary | ICD-10-CM | POA: Diagnosis not present

## 2024-01-12 DIAGNOSIS — K529 Noninfective gastroenteritis and colitis, unspecified: Secondary | ICD-10-CM | POA: Diagnosis not present

## 2024-01-12 DIAGNOSIS — D65 Disseminated intravascular coagulation [defibrination syndrome]: Secondary | ICD-10-CM | POA: Diagnosis not present

## 2024-01-12 DIAGNOSIS — E7800 Pure hypercholesterolemia, unspecified: Secondary | ICD-10-CM | POA: Diagnosis present

## 2024-01-12 DIAGNOSIS — H04123 Dry eye syndrome of bilateral lacrimal glands: Secondary | ICD-10-CM | POA: Diagnosis present

## 2024-01-12 DIAGNOSIS — Z48812 Encounter for surgical aftercare following surgery on the circulatory system: Secondary | ICD-10-CM | POA: Diagnosis not present

## 2024-01-12 DIAGNOSIS — Z955 Presence of coronary angioplasty implant and graft: Secondary | ICD-10-CM

## 2024-01-12 DIAGNOSIS — Z720 Tobacco use: Secondary | ICD-10-CM | POA: Diagnosis not present

## 2024-01-12 DIAGNOSIS — I471 Supraventricular tachycardia, unspecified: Secondary | ICD-10-CM | POA: Diagnosis not present

## 2024-01-12 DIAGNOSIS — Z823 Family history of stroke: Secondary | ICD-10-CM

## 2024-01-12 DIAGNOSIS — K567 Ileus, unspecified: Secondary | ICD-10-CM | POA: Diagnosis not present

## 2024-01-12 DIAGNOSIS — Z951 Presence of aortocoronary bypass graft: Secondary | ICD-10-CM | POA: Diagnosis not present

## 2024-01-12 DIAGNOSIS — Z4682 Encounter for fitting and adjustment of non-vascular catheter: Secondary | ICD-10-CM | POA: Diagnosis not present

## 2024-01-12 DIAGNOSIS — I480 Paroxysmal atrial fibrillation: Secondary | ICD-10-CM | POA: Diagnosis present

## 2024-01-12 DIAGNOSIS — Z87891 Personal history of nicotine dependence: Secondary | ICD-10-CM

## 2024-01-12 DIAGNOSIS — I2510 Atherosclerotic heart disease of native coronary artery without angina pectoris: Principal | ICD-10-CM | POA: Diagnosis present

## 2024-01-12 DIAGNOSIS — E785 Hyperlipidemia, unspecified: Secondary | ICD-10-CM | POA: Diagnosis not present

## 2024-01-12 DIAGNOSIS — Z7901 Long term (current) use of anticoagulants: Secondary | ICD-10-CM | POA: Diagnosis not present

## 2024-01-12 DIAGNOSIS — Z7902 Long term (current) use of antithrombotics/antiplatelets: Secondary | ICD-10-CM

## 2024-01-12 DIAGNOSIS — I25119 Atherosclerotic heart disease of native coronary artery with unspecified angina pectoris: Secondary | ICD-10-CM | POA: Diagnosis present

## 2024-01-12 DIAGNOSIS — K219 Gastro-esophageal reflux disease without esophagitis: Secondary | ICD-10-CM | POA: Diagnosis present

## 2024-01-12 DIAGNOSIS — J9811 Atelectasis: Secondary | ICD-10-CM | POA: Diagnosis not present

## 2024-01-12 DIAGNOSIS — D62 Acute posthemorrhagic anemia: Secondary | ICD-10-CM | POA: Diagnosis not present

## 2024-01-12 DIAGNOSIS — I517 Cardiomegaly: Secondary | ICD-10-CM | POA: Diagnosis not present

## 2024-01-12 DIAGNOSIS — I10 Essential (primary) hypertension: Secondary | ICD-10-CM | POA: Diagnosis present

## 2024-01-12 DIAGNOSIS — Y838 Other surgical procedures as the cause of abnormal reaction of the patient, or of later complication, without mention of misadventure at the time of the procedure: Secondary | ICD-10-CM | POA: Diagnosis not present

## 2024-01-12 DIAGNOSIS — I252 Old myocardial infarction: Secondary | ICD-10-CM | POA: Diagnosis not present

## 2024-01-12 DIAGNOSIS — Z79899 Other long term (current) drug therapy: Secondary | ICD-10-CM

## 2024-01-12 DIAGNOSIS — E876 Hypokalemia: Secondary | ICD-10-CM | POA: Diagnosis not present

## 2024-01-12 DIAGNOSIS — Z9889 Other specified postprocedural states: Secondary | ICD-10-CM | POA: Diagnosis not present

## 2024-01-12 DIAGNOSIS — R0989 Other specified symptoms and signs involving the circulatory and respiratory systems: Secondary | ICD-10-CM | POA: Diagnosis not present

## 2024-01-12 DIAGNOSIS — I808 Phlebitis and thrombophlebitis of other sites: Secondary | ICD-10-CM | POA: Diagnosis not present

## 2024-01-12 HISTORY — PX: TEE WITHOUT CARDIOVERSION: SHX5443

## 2024-01-12 HISTORY — PX: CORONARY ARTERY BYPASS GRAFT: SHX141

## 2024-01-12 LAB — POCT I-STAT 7, (LYTES, BLD GAS, ICA,H+H)
Acid-base deficit: 4 mmol/L — ABNORMAL HIGH (ref 0.0–2.0)
Acid-base deficit: 2 mmol/L (ref 0.0–2.0)
Acid-base deficit: 4 mmol/L — ABNORMAL HIGH (ref 0.0–2.0)
Bicarbonate: 21.2 mmol/L (ref 20.0–28.0)
Bicarbonate: 22.7 mmol/L (ref 20.0–28.0)
Bicarbonate: 21.3 mmol/L (ref 20.0–28.0)
Calcium, Ion: 1.09 mmol/L — ABNORMAL LOW (ref 1.15–1.40)
Calcium, Ion: 1.05 mmol/L — ABNORMAL LOW (ref 1.15–1.40)
Calcium, Ion: 1.2 mmol/L (ref 1.15–1.40)
HCT: 33 % — ABNORMAL LOW (ref 39.0–52.0)
HCT: 26 % — ABNORMAL LOW (ref 39.0–52.0)
HCT: 24 % — ABNORMAL LOW (ref 39.0–52.0)
Hemoglobin: 11.2 g/dL — ABNORMAL LOW (ref 13.0–17.0)
Hemoglobin: 8.8 g/dL — ABNORMAL LOW (ref 13.0–17.0)
Hemoglobin: 8.2 g/dL — ABNORMAL LOW (ref 13.0–17.0)
O2 Saturation: 100 %
O2 Saturation: 100 %
O2 Saturation: 100 %
Potassium: 3.3 mmol/L — ABNORMAL LOW (ref 3.5–5.1)
Potassium: 3.9 mmol/L (ref 3.5–5.1)
Potassium: 3.5 mmol/L (ref 3.5–5.1)
Sodium: 142 mmol/L (ref 135–145)
Sodium: 138 mmol/L (ref 135–145)
Sodium: 140 mmol/L (ref 135–145)
TCO2: 22 mmol/L (ref 22–32)
TCO2: 24 mmol/L (ref 22–32)
TCO2: 22 mmol/L (ref 22–32)
pCO2 arterial: 36 mmHg (ref 32–48)
pCO2 arterial: 38.6 mmHg (ref 32–48)
pCO2 arterial: 37.4 mmHg (ref 32–48)
pH, Arterial: 7.377 (ref 7.35–7.45)
pH, Arterial: 7.378 (ref 7.35–7.45)
pH, Arterial: 7.363 (ref 7.35–7.45)
pO2, Arterial: 443 mmHg — ABNORMAL HIGH (ref 83–108)
pO2, Arterial: 409 mmHg — ABNORMAL HIGH (ref 83–108)
pO2, Arterial: 337 mmHg — ABNORMAL HIGH (ref 83–108)

## 2024-01-12 LAB — POCT I-STAT, CHEM 8
BUN: 15 mg/dL (ref 8–23)
BUN: 16 mg/dL (ref 8–23)
BUN: 17 mg/dL (ref 8–23)
BUN: 18 mg/dL (ref 8–23)
BUN: 15 mg/dL (ref 8–23)
Calcium, Ion: 1.13 mmol/L — ABNORMAL LOW (ref 1.15–1.40)
Calcium, Ion: 1.17 mmol/L (ref 1.15–1.40)
Calcium, Ion: 1.14 mmol/L — ABNORMAL LOW (ref 1.15–1.40)
Calcium, Ion: 1.14 mmol/L — ABNORMAL LOW (ref 1.15–1.40)
Calcium, Ion: 1.22 mmol/L (ref 1.15–1.40)
Chloride: 109 mmol/L (ref 98–111)
Chloride: 107 mmol/L (ref 98–111)
Chloride: 105 mmol/L (ref 98–111)
Chloride: 105 mmol/L (ref 98–111)
Chloride: 106 mmol/L (ref 98–111)
Creatinine, Ser: 0.7 mg/dL (ref 0.61–1.24)
Creatinine, Ser: 0.8 mg/dL (ref 0.61–1.24)
Creatinine, Ser: 0.9 mg/dL (ref 0.61–1.24)
Creatinine, Ser: 0.8 mg/dL (ref 0.61–1.24)
Creatinine, Ser: 0.8 mg/dL (ref 0.61–1.24)
Glucose, Bld: 93 mg/dL (ref 70–99)
Glucose, Bld: 107 mg/dL — ABNORMAL HIGH (ref 70–99)
Glucose, Bld: 143 mg/dL — ABNORMAL HIGH (ref 70–99)
Glucose, Bld: 150 mg/dL — ABNORMAL HIGH (ref 70–99)
Glucose, Bld: 124 mg/dL — ABNORMAL HIGH (ref 70–99)
HCT: 32 % — ABNORMAL LOW (ref 39.0–52.0)
HCT: 32 % — ABNORMAL LOW (ref 39.0–52.0)
HCT: 26 % — ABNORMAL LOW (ref 39.0–52.0)
HCT: 26 % — ABNORMAL LOW (ref 39.0–52.0)
HCT: 26 % — ABNORMAL LOW (ref 39.0–52.0)
Hemoglobin: 10.9 g/dL — ABNORMAL LOW (ref 13.0–17.0)
Hemoglobin: 10.9 g/dL — ABNORMAL LOW (ref 13.0–17.0)
Hemoglobin: 8.8 g/dL — ABNORMAL LOW (ref 13.0–17.0)
Hemoglobin: 8.8 g/dL — ABNORMAL LOW (ref 13.0–17.0)
Hemoglobin: 8.8 g/dL — ABNORMAL LOW (ref 13.0–17.0)
Potassium: 3.1 mmol/L — ABNORMAL LOW (ref 3.5–5.1)
Potassium: 3.3 mmol/L — ABNORMAL LOW (ref 3.5–5.1)
Potassium: 4.4 mmol/L (ref 3.5–5.1)
Potassium: 4.4 mmol/L (ref 3.5–5.1)
Potassium: 3.5 mmol/L (ref 3.5–5.1)
Sodium: 143 mmol/L (ref 135–145)
Sodium: 141 mmol/L (ref 135–145)
Sodium: 138 mmol/L (ref 135–145)
Sodium: 138 mmol/L (ref 135–145)
Sodium: 141 mmol/L (ref 135–145)
TCO2: 21 mmol/L — ABNORMAL LOW (ref 22–32)
TCO2: 23 mmol/L (ref 22–32)
TCO2: 33 mmol/L — ABNORMAL HIGH (ref 22–32)
TCO2: 26 mmol/L (ref 22–32)
TCO2: 23 mmol/L (ref 22–32)

## 2024-01-12 LAB — CBC
HCT: 32.6 % — ABNORMAL LOW (ref 39.0–52.0)
HCT: 33 % — ABNORMAL LOW (ref 39.0–52.0)
Hemoglobin: 10.6 g/dL — ABNORMAL LOW (ref 13.0–17.0)
Hemoglobin: 10.8 g/dL — ABNORMAL LOW (ref 13.0–17.0)
MCH: 32 pg (ref 26.0–34.0)
MCH: 32.1 pg (ref 26.0–34.0)
MCHC: 32.5 g/dL (ref 30.0–36.0)
MCHC: 32.7 g/dL (ref 30.0–36.0)
MCV: 98.5 fL (ref 80.0–100.0)
MCV: 98.2 fL (ref 80.0–100.0)
Platelets: 120 K/uL — ABNORMAL LOW (ref 150–400)
Platelets: 132 K/uL — ABNORMAL LOW (ref 150–400)
RBC: 3.31 MIL/uL — ABNORMAL LOW (ref 4.22–5.81)
RBC: 3.36 MIL/uL — ABNORMAL LOW (ref 4.22–5.81)
RDW: 13.3 % (ref 11.5–15.5)
RDW: 13.3 % (ref 11.5–15.5)
WBC: 7.7 K/uL (ref 4.0–10.5)
WBC: 8 K/uL (ref 4.0–10.5)
nRBC: 0 % (ref 0.0–0.2)
nRBC: 0 % (ref 0.0–0.2)

## 2024-01-12 LAB — PROTIME-INR
INR: 1.4 — ABNORMAL HIGH (ref 0.8–1.2)
Prothrombin Time: 18.4 s — ABNORMAL HIGH (ref 11.4–15.2)

## 2024-01-12 LAB — BASIC METABOLIC PANEL WITH GFR
Anion gap: 8 (ref 5–15)
BUN: 17 mg/dL (ref 8–23)
CO2: 21 mmol/L — ABNORMAL LOW (ref 22–32)
Calcium: 8.1 mg/dL — ABNORMAL LOW (ref 8.9–10.3)
Chloride: 108 mmol/L (ref 98–111)
Creatinine, Ser: 1.03 mg/dL (ref 0.61–1.24)
GFR, Estimated: >60 mL/min (ref >60)
Glucose, Bld: 128 mg/dL — ABNORMAL HIGH (ref 70–99)
Potassium: 4.6 mmol/L (ref 3.5–5.1)
Sodium: 137 mmol/L (ref 135–145)

## 2024-01-12 LAB — POCT I-STAT EG7
Acid-base deficit: 3 mmol/L — ABNORMAL HIGH (ref 0.0–2.0)
Bicarbonate: 23 mmol/L (ref 20.0–28.0)
Calcium, Ion: 1.09 mmol/L — ABNORMAL LOW (ref 1.15–1.40)
HCT: 28 % — ABNORMAL LOW (ref 39.0–52.0)
Hemoglobin: 9.5 g/dL — ABNORMAL LOW (ref 13.0–17.0)
O2 Saturation: 81 %
Potassium: 4 mmol/L (ref 3.5–5.1)
Sodium: 139 mmol/L (ref 135–145)
TCO2: 24 mmol/L (ref 22–32)
pCO2, Ven: 43 mmHg — ABNORMAL LOW (ref 44–60)
pH, Ven: 7.337 (ref 7.25–7.43)
pO2, Ven: 49 mmHg — ABNORMAL HIGH (ref 32–45)

## 2024-01-12 LAB — GLUCOSE, CAPILLARY
Glucose-Capillary: 110 mg/dL — ABNORMAL HIGH (ref 70–99)
Glucose-Capillary: 119 mg/dL — ABNORMAL HIGH (ref 70–99)
Glucose-Capillary: 130 mg/dL — ABNORMAL HIGH (ref 70–99)
Glucose-Capillary: 115 mg/dL — ABNORMAL HIGH (ref 70–99)
Glucose-Capillary: 130 mg/dL — ABNORMAL HIGH (ref 70–99)

## 2024-01-12 LAB — PLATELET COUNT: Platelets: 146 K/uL — ABNORMAL LOW (ref 150–400)

## 2024-01-12 LAB — HEMOGLOBIN AND HEMATOCRIT, BLOOD
HCT: 26.7 % — ABNORMAL LOW (ref 39.0–52.0)
Hemoglobin: 8.8 g/dL — ABNORMAL LOW (ref 13.0–17.0)

## 2024-01-12 LAB — HEMOGLOBIN A1C
Hgb A1c MFr Bld: 5.1 % (ref 4.8–5.6)
Mean Plasma Glucose: 100 mg/dL

## 2024-01-12 LAB — APTT: aPTT: 34 s (ref 24–36)

## 2024-01-12 LAB — ABO/RH: ABO/RH(D): A POS

## 2024-01-12 SURGERY — CORONARY ARTERY BYPASS GRAFTING (CABG)
Anesthesia: General | Site: Chest

## 2024-01-12 MED ORDER — ROCURONIUM BROMIDE 10 MG/ML (PF) SYRINGE
PREFILLED_SYRINGE | INTRAVENOUS | Status: DC | PRN
  Administered 2024-01-12: 80 mg via INTRAVENOUS
  Administered 2024-01-12: 50 mg via INTRAVENOUS
  Administered 2024-01-12: 20 mg via INTRAVENOUS
  Administered 2024-01-12 (×2): 50 mg via INTRAVENOUS

## 2024-01-12 MED ORDER — HEPARIN SODIUM (PORCINE) 1000 UNIT/ML IJ SOLN
INTRAMUSCULAR | Status: AC
  Filled 2024-01-12: qty 1

## 2024-01-12 MED ORDER — SODIUM CHLORIDE 0.9 % IV SOLN: INTRAVENOUS | Status: DC | PRN

## 2024-01-12 MED ORDER — SODIUM CHLORIDE (PF) 0.9 % IJ SOLN
OROMUCOSAL | Status: DC | PRN
  Administered 2024-01-12 (×2): 4 mL via TOPICAL

## 2024-01-12 MED ORDER — MIDAZOLAM HCL 2 MG/2ML IJ SOLN
INTRAMUSCULAR | Status: AC
  Filled 2024-01-12: qty 2

## 2024-01-12 MED ORDER — DOCUSATE SODIUM 100 MG PO CAPS
200.0000 mg | ORAL_CAPSULE | Freq: Every day | ORAL | Status: DC
  Administered 2024-01-13 – 2024-01-18 (×6): 200 mg via ORAL
  Filled 2024-01-12 (×7): qty 2

## 2024-01-12 MED ORDER — ROCURONIUM BROMIDE 10 MG/ML (PF) SYRINGE
PREFILLED_SYRINGE | INTRAVENOUS | Status: AC
  Filled 2024-01-12: qty 10

## 2024-01-12 MED ORDER — ONDANSETRON HCL 4 MG/2ML IJ SOLN
INTRAMUSCULAR | Status: AC
  Filled 2024-01-12: qty 2

## 2024-01-12 MED ORDER — MAGNESIUM SULFATE 4 GM/100ML IV SOLN
4.0000 g | Freq: Once | INTRAVENOUS | Status: AC
  Administered 2024-01-12: 4 g via INTRAVENOUS
  Filled 2024-01-12: qty 100

## 2024-01-12 MED ORDER — ASPIRIN 81 MG PO CHEW
324.0000 mg | CHEWABLE_TABLET | Freq: Once | ORAL | Status: AC
  Administered 2024-01-12: 324 mg via ORAL

## 2024-01-12 MED ORDER — SODIUM BICARBONATE 8.4 % IV SOLN
50.0000 meq | Freq: Once | INTRAVENOUS | Status: AC
  Administered 2024-01-12: 50 meq via INTRAVENOUS

## 2024-01-12 MED ORDER — VANCOMYCIN HCL IN DEXTROSE 1-5 GM/200ML-% IV SOLN
1000.0000 mg | Freq: Once | INTRAVENOUS | Status: AC
  Administered 2024-01-12: 1000 mg via INTRAVENOUS
  Filled 2024-01-12: qty 200

## 2024-01-12 MED ORDER — SODIUM CHLORIDE 0.45 % IV SOLN: INTRAVENOUS | Status: AC | PRN

## 2024-01-12 MED ORDER — BISACODYL 10 MG RE SUPP: 10.0000 mg | Freq: Every day | RECTAL | Status: DC

## 2024-01-12 MED ORDER — NITROGLYCERIN IN D5W 200-5 MCG/ML-% IV SOLN: 0.0000 ug/min | INTRAVENOUS | Status: DC

## 2024-01-12 MED ORDER — CEFAZOLIN SODIUM-DEXTROSE 2-4 GM/100ML-% IV SOLN
2.0000 g | Freq: Three times a day (TID) | INTRAVENOUS | Status: AC
  Administered 2024-01-12 – 2024-01-14 (×6): 2 g via INTRAVENOUS
  Filled 2024-01-12 (×6): qty 100

## 2024-01-12 MED ORDER — DEXTROSE 50 % IV SOLN: 0.0000 mL | INTRAVENOUS | Status: DC | PRN

## 2024-01-12 MED ORDER — LACTATED RINGERS IV SOLN: INTRAVENOUS | Status: DC | PRN

## 2024-01-12 MED ORDER — 0.9 % SODIUM CHLORIDE (POUR BTL) OPTIME
TOPICAL | Status: DC | PRN
  Administered 2024-01-12: 5000 mL

## 2024-01-12 MED ORDER — PHENYLEPHRINE 80 MCG/ML (10ML) SYRINGE FOR IV PUSH (FOR BLOOD PRESSURE SUPPORT)
PREFILLED_SYRINGE | INTRAVENOUS | Status: DC | PRN
  Administered 2024-01-12: 40 ug via INTRAVENOUS
  Administered 2024-01-12: 80 ug via INTRAVENOUS
  Administered 2024-01-12: 40 ug via INTRAVENOUS

## 2024-01-12 MED ORDER — INSULIN REGULAR(HUMAN) IN NACL 100-0.9 UT/100ML-% IV SOLN: INTRAVENOUS | Status: DC

## 2024-01-12 MED ORDER — CHLORHEXIDINE GLUCONATE 0.12 % MT SOLN
15.0000 mL | Freq: Once | OROMUCOSAL | Status: DC
  Filled 2024-01-12: qty 15

## 2024-01-12 MED ORDER — FENTANYL CITRATE (PF) 250 MCG/5ML IJ SOLN
INTRAMUSCULAR | Status: AC
  Filled 2024-01-12: qty 5

## 2024-01-12 MED ORDER — RACEPINEPHRINE HCL 2.25 % IN NEBU
0.5000 mL | INHALATION_SOLUTION | RESPIRATORY_TRACT | Status: DC | PRN
  Administered 2024-01-12: 0.5 mL via RESPIRATORY_TRACT

## 2024-01-12 MED ORDER — ORAL CARE MOUTH RINSE: 15.0000 mL | Freq: Once | OROMUCOSAL | Status: AC

## 2024-01-12 MED ORDER — PANTOPRAZOLE SODIUM 40 MG PO TBEC
40.0000 mg | DELAYED_RELEASE_TABLET | Freq: Every day | ORAL | Status: DC
  Administered 2024-01-14 – 2024-01-21 (×8): 40 mg via ORAL
  Filled 2024-01-12 (×8): qty 1

## 2024-01-12 MED ORDER — POTASSIUM CHLORIDE 10 MEQ/50ML IV SOLN: 10.0000 meq | INTRAVENOUS | Status: AC

## 2024-01-12 MED ORDER — PROTAMINE SULFATE 10 MG/ML IV SOLN
INTRAVENOUS | Status: DC | PRN
  Administered 2024-01-12: 380 mg via INTRAVENOUS

## 2024-01-12 MED ORDER — ARTIFICIAL TEARS OPHTHALMIC OINT
TOPICAL_OINTMENT | OPHTHALMIC | Status: AC
  Filled 2024-01-12: qty 3.5

## 2024-01-12 MED ORDER — MIDAZOLAM HCL 2 MG/2ML IJ SOLN: 2.0000 mg | INTRAMUSCULAR | Status: DC | PRN

## 2024-01-12 MED ORDER — ASPIRIN 325 MG PO TBEC: 325.0000 mg | DELAYED_RELEASE_TABLET | Freq: Every day | ORAL | Status: DC

## 2024-01-12 MED ORDER — SODIUM CHLORIDE 0.9 % IV SOLN: 250.0000 mL | INTRAVENOUS | Status: AC

## 2024-01-12 MED ORDER — METOPROLOL TARTRATE 25 MG/10 ML ORAL SUSPENSION: 12.5000 mg | Freq: Two times a day (BID) | ORAL | Status: DC

## 2024-01-12 MED ORDER — ARTIFICIAL TEARS OPHTHALMIC OINT
TOPICAL_OINTMENT | OPHTHALMIC | Status: DC | PRN
  Administered 2024-01-12: 1 via OPHTHALMIC

## 2024-01-12 MED ORDER — LIDOCAINE 2% (20 MG/ML) 5 ML SYRINGE
INTRAMUSCULAR | Status: AC
  Filled 2024-01-12: qty 5

## 2024-01-12 MED ORDER — PHENYLEPHRINE 80 MCG/ML (10ML) SYRINGE FOR IV PUSH (FOR BLOOD PRESSURE SUPPORT)
PREFILLED_SYRINGE | INTRAVENOUS | Status: AC
  Filled 2024-01-12: qty 10

## 2024-01-12 MED ORDER — ACETAMINOPHEN 160 MG/5ML PO SOLN
650.0000 mg | Freq: Once | ORAL | Status: AC
  Administered 2024-01-12: 650 mg

## 2024-01-12 MED ORDER — OXYCODONE HCL 5 MG PO TABS
5.0000 mg | ORAL_TABLET | ORAL | Status: DC | PRN
  Administered 2024-01-12 – 2024-01-13 (×3): 10 mg via ORAL
  Filled 2024-01-12 (×3): qty 2

## 2024-01-12 MED ORDER — ALBUMIN HUMAN 5 % IV SOLN
250.0000 mL | INTRAVENOUS | Status: DC | PRN
  Administered 2024-01-12 (×3): 12.5 g via INTRAVENOUS
  Filled 2024-01-12: qty 250

## 2024-01-12 MED ORDER — SODIUM CHLORIDE 0.9% FLUSH: 3.0000 mL | INTRAVENOUS | Status: DC | PRN

## 2024-01-12 MED ORDER — CHLORHEXIDINE GLUCONATE 0.12 % MT SOLN
15.0000 mL | Freq: Once | OROMUCOSAL | Status: AC
  Administered 2024-01-12: 15 mL via OROMUCOSAL

## 2024-01-12 MED ORDER — PROTAMINE SULFATE 10 MG/ML IV SOLN
INTRAVENOUS | Status: AC
  Filled 2024-01-12: qty 25

## 2024-01-12 MED ORDER — TRAMADOL HCL 50 MG PO TABS: 50.0000 mg | ORAL_TABLET | ORAL | Status: DC | PRN

## 2024-01-12 MED ORDER — SODIUM CHLORIDE 0.9% FLUSH
3.0000 mL | Freq: Two times a day (BID) | INTRAVENOUS | Status: DC
  Administered 2024-01-13 – 2024-01-15 (×5): 3 mL via INTRAVENOUS

## 2024-01-12 MED ORDER — SUGAMMADEX SODIUM 200 MG/2ML IV SOLN
INTRAVENOUS | Status: DC | PRN
Start: 2024-01-12 — End: 2024-01-12
  Administered 2024-01-12: 200 mg via INTRAVENOUS

## 2024-01-12 MED ORDER — DEXMEDETOMIDINE HCL IN NACL 400 MCG/100ML IV SOLN
0.0000 ug/kg/h | INTRAVENOUS | Status: DC
  Administered 2024-01-12: 0.7 ug/kg/h via INTRAVENOUS

## 2024-01-12 MED ORDER — PHENYLEPHRINE HCL-NACL 20-0.9 MG/250ML-% IV SOLN
0.0000 ug/min | INTRAVENOUS | Status: DC
  Administered 2024-01-12: 0 ug/min via INTRAVENOUS

## 2024-01-12 MED ORDER — ASPIRIN 81 MG PO CHEW
324.0000 mg | CHEWABLE_TABLET | Freq: Every day | ORAL | Status: DC
  Administered 2024-01-13: 324 mg via ORAL
  Filled 2024-01-12 (×3): qty 4

## 2024-01-12 MED ORDER — ORAL CARE MOUTH RINSE: 15.0000 mL | OROMUCOSAL | Status: DC | PRN

## 2024-01-12 MED ORDER — HEPARIN SODIUM (PORCINE) 1000 UNIT/ML IJ SOLN
INTRAMUSCULAR | Status: DC | PRN
  Administered 2024-01-12: 39000 [IU] via INTRAVENOUS

## 2024-01-12 MED ORDER — ONDANSETRON HCL 4 MG/2ML IJ SOLN: 4.0000 mg | Freq: Four times a day (QID) | INTRAMUSCULAR | Status: DC | PRN

## 2024-01-12 MED ORDER — ASPIRIN 325 MG PO TBEC
325.0000 mg | DELAYED_RELEASE_TABLET | Freq: Every day | ORAL | Status: DC
  Administered 2024-01-14 – 2024-01-15 (×2): 325 mg via ORAL
  Filled 2024-01-12 (×2): qty 1

## 2024-01-12 MED ORDER — SODIUM CHLORIDE 0.9 % IV SOLN: INTRAVENOUS | Status: AC

## 2024-01-12 MED ORDER — ACETAMINOPHEN 500 MG PO TABS
1000.0000 mg | ORAL_TABLET | Freq: Four times a day (QID) | ORAL | Status: DC
Start: 2024-01-13 — End: 2024-01-12

## 2024-01-12 MED ORDER — METOPROLOL TARTRATE 5 MG/5ML IV SOLN
2.5000 mg | INTRAVENOUS | Status: DC | PRN
  Administered 2024-01-17 – 2024-01-18 (×2): 5 mg via INTRAVENOUS
  Filled 2024-01-12 (×2): qty 5

## 2024-01-12 MED ORDER — BISACODYL 5 MG PO TBEC
10.0000 mg | DELAYED_RELEASE_TABLET | Freq: Every day | ORAL | Status: DC
  Administered 2024-01-13 – 2024-01-18 (×6): 10 mg via ORAL
  Filled 2024-01-12 (×7): qty 2

## 2024-01-12 MED ORDER — ACETAMINOPHEN 500 MG PO TABS
1000.0000 mg | ORAL_TABLET | Freq: Four times a day (QID) | ORAL | Status: AC
  Administered 2024-01-12 – 2024-01-17 (×18): 1000 mg via ORAL
  Filled 2024-01-12 (×20): qty 2

## 2024-01-12 MED ORDER — CHLORHEXIDINE GLUCONATE 4 % EX SOLN: 30.0000 mL | CUTANEOUS | Status: DC

## 2024-01-12 MED ORDER — LACTATED RINGERS IV SOLN: INTRAVENOUS | Status: AC

## 2024-01-12 MED ORDER — METOPROLOL TARTRATE 12.5 MG HALF TABLET: 12.5000 mg | ORAL_TABLET | Freq: Once | ORAL | Status: DC

## 2024-01-12 MED ORDER — ACETAMINOPHEN 160 MG/5ML PO SOLN: 1000.0000 mg | Freq: Four times a day (QID) | ORAL | Status: AC

## 2024-01-12 MED ORDER — ACETAMINOPHEN 160 MG/5ML PO SOLN: 1000.0000 mg | Freq: Four times a day (QID) | ORAL | Status: DC

## 2024-01-12 MED ORDER — CHLORHEXIDINE GLUCONATE 0.12 % MT SOLN
15.0000 mL | OROMUCOSAL | Status: AC
  Administered 2024-01-12: 15 mL via OROMUCOSAL
  Filled 2024-01-12: qty 15

## 2024-01-12 MED ORDER — RACEPINEPHRINE HCL 2.25 % IN NEBU
INHALATION_SOLUTION | RESPIRATORY_TRACT | Status: AC
  Filled 2024-01-12: qty 0.5

## 2024-01-12 MED ORDER — MIDAZOLAM HCL 2 MG/2ML IJ SOLN
INTRAMUSCULAR | Status: DC | PRN
  Administered 2024-01-12 (×2): 2 mg via INTRAVENOUS

## 2024-01-12 MED ORDER — ATORVASTATIN CALCIUM 80 MG PO TABS
80.0000 mg | ORAL_TABLET | Freq: Every day | ORAL | Status: DC
  Administered 2024-01-13 – 2024-01-21 (×9): 80 mg via ORAL
  Filled 2024-01-12 (×2): qty 1
  Filled 2024-01-12: qty 2
  Filled 2024-01-12 (×6): qty 1

## 2024-01-12 MED ORDER — FENTANYL CITRATE (PF) 250 MCG/5ML IJ SOLN
INTRAMUSCULAR | Status: DC | PRN
  Administered 2024-01-12 (×3): 50 ug via INTRAVENOUS
  Administered 2024-01-12: 25 ug via INTRAVENOUS
  Administered 2024-01-12: 150 ug via INTRAVENOUS
  Administered 2024-01-12 (×2): 50 ug via INTRAVENOUS
  Administered 2024-01-12: 100 ug via INTRAVENOUS
  Administered 2024-01-12: 50 ug via INTRAVENOUS
  Administered 2024-01-12: 100 ug via INTRAVENOUS
  Administered 2024-01-12: 25 ug via INTRAVENOUS
  Administered 2024-01-12: 50 ug via INTRAVENOUS

## 2024-01-12 MED ORDER — MORPHINE SULFATE (PF) 2 MG/ML IV SOLN
1.0000 mg | INTRAVENOUS | Status: DC | PRN
  Administered 2024-01-14: 2 mg via INTRAVENOUS
  Filled 2024-01-12: qty 2

## 2024-01-12 MED ORDER — ~~LOC~~ CARDIAC SURGERY, PATIENT & FAMILY EDUCATION
Freq: Once | Status: DC
  Filled 2024-01-12: qty 1

## 2024-01-12 MED ORDER — PANTOPRAZOLE SODIUM 40 MG IV SOLR
40.0000 mg | Freq: Every day | INTRAVENOUS | Status: AC
  Administered 2024-01-12 – 2024-01-13 (×2): 40 mg via INTRAVENOUS
  Filled 2024-01-12 (×2): qty 10

## 2024-01-12 MED ORDER — METOPROLOL TARTRATE 12.5 MG HALF TABLET: 12.5000 mg | ORAL_TABLET | Freq: Two times a day (BID) | ORAL | Status: DC

## 2024-01-12 MED ORDER — LACTATED RINGERS IV SOLN: INTRAVENOUS | Status: DC

## 2024-01-12 MED ORDER — ASPIRIN 81 MG PO CHEW: 324.0000 mg | CHEWABLE_TABLET | Freq: Every day | ORAL | Status: DC

## 2024-01-12 MED ORDER — ONDANSETRON HCL 4 MG/2ML IJ SOLN
INTRAMUSCULAR | Status: DC | PRN
  Administered 2024-01-12: 4 mg via INTRAVENOUS

## 2024-01-12 MED ORDER — PROPOFOL 10 MG/ML IV BOLUS
INTRAVENOUS | Status: DC | PRN
  Administered 2024-01-12: 30 mg via INTRAVENOUS
  Administered 2024-01-12: 60 mg via INTRAVENOUS

## 2024-01-12 MED ORDER — NICARDIPINE HCL IN NACL 40-0.83 MG/200ML-% IV SOLN
3.0000 mg/h | INTRAVENOUS | Status: DC
  Administered 2024-01-12: 5 mg/h via INTRAVENOUS
  Filled 2024-01-12: qty 200

## 2024-01-12 MED ORDER — PLASMA-LYTE A IV SOLN
INTRAVENOUS | Status: DC | PRN
  Administered 2024-01-12: 500 mL via INTRAVASCULAR

## 2024-01-12 MED ORDER — PROPOFOL 10 MG/ML IV BOLUS
INTRAVENOUS | Status: AC
  Filled 2024-01-12: qty 20

## 2024-01-12 MED ORDER — METOCLOPRAMIDE HCL 5 MG/ML IJ SOLN
10.0000 mg | Freq: Four times a day (QID) | INTRAMUSCULAR | Status: AC
  Administered 2024-01-12 – 2024-01-13 (×6): 10 mg via INTRAVENOUS
  Filled 2024-01-12 (×6): qty 2

## 2024-01-12 MED ORDER — CHLORHEXIDINE GLUCONATE CLOTH 2 % EX PADS
6.0000 | MEDICATED_PAD | Freq: Every day | CUTANEOUS | Status: DC
  Administered 2024-01-13 – 2024-01-16 (×4): 6 via TOPICAL

## 2024-01-12 MED ORDER — ALBUMIN HUMAN 5 % IV SOLN: INTRAVENOUS | Status: DC | PRN

## 2024-01-12 MED ORDER — LIDOCAINE HCL (CARDIAC) PF 100 MG/5ML IV SOSY
PREFILLED_SYRINGE | INTRAVENOUS | Status: DC | PRN
  Administered 2024-01-12: 80 mg via INTRATRACHEAL

## 2024-01-12 MED ORDER — NITROGLYCERIN 0.2 MG/ML ON CALL CATH LAB
INTRAVENOUS | Status: DC | PRN
Start: 2024-01-12 — End: 2024-01-12
  Administered 2024-01-12 (×3): 50 ug via INTRAVENOUS

## 2024-01-12 SURGICAL SUPPLY — 84 items
ADAPTER MULTI PERFUSION 15 (ADAPTER) ×2 IMPLANT
BAG DECANTER FOR FLEXI CONT (MISCELLANEOUS) ×2 IMPLANT
BLADE CLIPPER SURG (BLADE) ×2 IMPLANT
BLADE STERNUM SYSTEM 6 (BLADE) ×2 IMPLANT
BLADE SURG 10 STRL SS (BLADE) IMPLANT
BLADE SURG 11 STRL SS (BLADE) IMPLANT
BNDG ELASTIC 4INX 5YD STR LF (GAUZE/BANDAGES/DRESSINGS) IMPLANT
BNDG ELASTIC 4X5.8 VLCR STR LF (GAUZE/BANDAGES/DRESSINGS) ×2 IMPLANT
BNDG ELASTIC 6INX 5YD STR LF (GAUZE/BANDAGES/DRESSINGS) ×2 IMPLANT
BNDG GAUZE DERMACEA FLUFF 4 (GAUZE/BANDAGES/DRESSINGS) ×2 IMPLANT
CANISTER SUCTION 3000ML PPV (SUCTIONS) ×2 IMPLANT
CANNULA AORTIC ROOT 9FR (CANNULA) ×2 IMPLANT
CANNULA MC2 2 STG 29/37 NON-V (CANNULA) ×2 IMPLANT
CANNULA NON VENT 22FR 12 (CANNULA) IMPLANT
CANNULA VESSEL 3MM BLUNT TIP (CANNULA) IMPLANT
CATH ROBINSON RED A/P 18FR (CATHETERS) ×4 IMPLANT
CLIP TI MEDIUM 24 (CLIP) IMPLANT
CLIP TI WIDE RED SMALL 24 (CLIP) IMPLANT
CONNECTOR BLAKE 2:1 CARIO BLK (MISCELLANEOUS) ×2 IMPLANT
CONTAINER PROTECT SURGISLUSH (MISCELLANEOUS) ×4 IMPLANT
COUNTER NDL 20CT MAGNET RED (NEEDLE) IMPLANT
COVER MAYO STAND STRL (DRAPES) IMPLANT
DERMABOND ADVANCED .7 DNX12 (GAUZE/BANDAGES/DRESSINGS) IMPLANT
DRAIN CHANNEL 19F RND (DRAIN) ×6 IMPLANT
DRAIN CONNECTOR BLAKE 1:1 (MISCELLANEOUS) IMPLANT
DRAPE SRG 135X102X78XABS (DRAPES) ×2 IMPLANT
DRAPE WARM FLUID 44X44 (DRAPES) ×2 IMPLANT
DRSG AQUACEL AG ADV 3.5X10 (GAUZE/BANDAGES/DRESSINGS) ×2 IMPLANT
ELECTRODE BLDE 4.0 EZ CLN MEGD (MISCELLANEOUS) ×2 IMPLANT
ELECTRODE REM PT RTRN 9FT ADLT (ELECTROSURGICAL) ×4 IMPLANT
FELT TEFLON 1X6 (MISCELLANEOUS) ×4 IMPLANT
GAUZE 4X4 16PLY ~~LOC~~+RFID DBL (SPONGE) IMPLANT
GAUZE SPONGE 4X4 12PLY STRL (GAUZE/BANDAGES/DRESSINGS) ×4 IMPLANT
GLOVE BIO SURGEON STRL SZ7 (GLOVE) IMPLANT
GLOVE BIOGEL M STRL SZ7.5 (GLOVE) IMPLANT
GOWN STRL REUS W/ TWL LRG LVL3 (GOWN DISPOSABLE) ×8 IMPLANT
GOWN STRL REUS W/ TWL XL LVL3 (GOWN DISPOSABLE) ×4 IMPLANT
GOWN STRL SURGICAL XL XLNG (GOWN DISPOSABLE) ×4 IMPLANT
HEMOSTAT POWDER SURGIFOAM 1G (HEMOSTASIS) ×4 IMPLANT
INSERT SUTURE HOLDER (MISCELLANEOUS) ×2 IMPLANT
KIT BASIN OR (CUSTOM PROCEDURE TRAY) ×2 IMPLANT
KIT TURNOVER KIT B (KITS) ×2 IMPLANT
KIT VASOVIEW HEMOPRO 2 VH 4000 (KITS) ×2 IMPLANT
LEAD PACING MYOCARDI (MISCELLANEOUS) ×2 IMPLANT
LOOP VASCLR MAXI BLUE 18IN ST (MISCELLANEOUS) IMPLANT
LOOPS VASCLR MAXI BLUE 18IN ST (MISCELLANEOUS) ×2 IMPLANT
MARKER DISTAL GRAFT W/ HOLDER (MISCELLANEOUS) ×6 IMPLANT
NS IRRIG 1000ML POUR BTL (IV SOLUTION) ×10 IMPLANT
PACK E OPEN HEART (SUTURE) ×2 IMPLANT
PACK OPEN HEART (CUSTOM PROCEDURE TRAY) ×2 IMPLANT
PAD ARMBOARD POSITIONER FOAM (MISCELLANEOUS) ×4 IMPLANT
PAD ELECT DEFIB RADIOL ZOLL (MISCELLANEOUS) ×2 IMPLANT
PENCIL BUTTON HOLSTER BLD 10FT (ELECTRODE) ×2 IMPLANT
PENCIL SMOKE EVACUATOR (MISCELLANEOUS) IMPLANT
POSITIONER HEAD DONUT 9IN (MISCELLANEOUS) ×2 IMPLANT
PUNCH AORTIC ROTATE 4.0MM (MISCELLANEOUS) ×2 IMPLANT
SET MPS 3-ND DEL (MISCELLANEOUS) IMPLANT
SOLUTION ANTFG W/FOAM PAD STRL (MISCELLANEOUS) IMPLANT
SPONGE T-LAP 18X18 ~~LOC~~+RFID (SPONGE) IMPLANT
STOPCOCK 4 WAY LG BORE MALE ST (IV SETS) IMPLANT
SUPPORT HEART JANKE-BARRON (MISCELLANEOUS) ×2 IMPLANT
SUT ETHIBOND X763 2 0 SH 1 (SUTURE) ×4 IMPLANT
SUT MNCRL AB 3-0 PS2 18 (SUTURE) ×4 IMPLANT
SUT MNCRL AB 4-0 PS2 18 (SUTURE) IMPLANT
SUT PDS AB 1 CTX 36 (SUTURE) ×4 IMPLANT
SUT PROLENE 4 0 SH DA (SUTURE) ×2 IMPLANT
SUT PROLENE 5 0 C 1 36 (SUTURE) ×6 IMPLANT
SUT PROLENE 7 0 BV 1 (SUTURE) IMPLANT
SUT PROLENE 7 0 BV1 MDA (SUTURE) ×2 IMPLANT
SUT SILK 2 0 TIES 10X30 (SUTURE) IMPLANT
SUT SILK 4 0 TIE 10X30 (SUTURE) IMPLANT
SUT STEEL STERNAL CCS#1 18IN (SUTURE) IMPLANT
SUT VIC AB 2-0 CT1 TAPERPNT 27 (SUTURE) IMPLANT
SYSTEM SAHARA CHEST DRAIN ATS (WOUND CARE) ×2 IMPLANT
TAPE CLOTH SURG 4X10 WHT LF (GAUZE/BANDAGES/DRESSINGS) IMPLANT
TAPE PAPER 2X10 WHT MICROPORE (GAUZE/BANDAGES/DRESSINGS) IMPLANT
TOWEL GREEN STERILE (TOWEL DISPOSABLE) ×2 IMPLANT
TOWEL GREEN STERILE FF (TOWEL DISPOSABLE) ×2 IMPLANT
TRAY FOLEY SLVR 16FR TEMP STAT (SET/KITS/TRAYS/PACK) ×2 IMPLANT
TUBE SUCT INTRACARD DLP 20F (MISCELLANEOUS) ×2 IMPLANT
TUBE SUCTION CARDIAC 10FR (CANNULA) ×2 IMPLANT
TUBING LAP HI FLOW INSUFFLATIO (TUBING) ×2 IMPLANT
UNDERPAD 30X36 HEAVY ABSORB (UNDERPADS AND DIAPERS) ×2 IMPLANT
WATER STERILE IRR 1000ML POUR (IV SOLUTION) ×4 IMPLANT

## 2024-01-12 NOTE — Anesthesia Preprocedure Evaluation (Addendum)
 Anesthesia Evaluation  Patient identified by MRN, date of birth, ID band Patient awake    Reviewed: Allergy & Precautions, NPO status , Patient's Chart, lab work & pertinent test results  History of Anesthesia Complications Negative for: history of anesthetic complications  Airway Mallampati: II  TM Distance: >3 FB Neck ROM: Full    Dental  (+) Teeth Intact, Dental Advisory Given   Pulmonary neg shortness of breath, neg sleep apnea, neg COPD, neg recent URI, former smoker   breath sounds clear to auscultation       Cardiovascular hypertension, + angina  + CAD and + Cardiac Stents   Rhythm:Regular     Neuro/Psych negative neurological ROS  negative psych ROS   GI/Hepatic Neg liver ROS,GERD  ,,  Endo/Other  negative endocrine ROS    Renal/GU negative Renal ROS     Musculoskeletal negative musculoskeletal ROS (+)    Abdominal   Peds  Hematology negative hematology ROS (+) Lab Results      Component                Value               Date                      WBC                      5.3                 01/11/2024                HGB                      14.2                01/11/2024                HCT                      43.8                01/11/2024                MCV                      99.1                01/11/2024                PLT                      222                 01/11/2024              Anesthesia Other Findings   Reproductive/Obstetrics                              Anesthesia Physical Anesthesia Plan  ASA: 4  Anesthesia Plan: General   Post-op Pain Management:    Induction: Intravenous  PONV Risk Score and Plan: 2 and Ondansetron   Airway Management Planned: Oral ETT  Additional Equipment: Arterial line, CVP, TEE and Ultrasound Guidance Line Placement  Intra-op Plan:   Post-operative Plan: Post-operative intubation/ventilation  Informed Consent: I have  reviewed the patients History and Physical, chart,  labs and discussed the procedure including the risks, benefits and alternatives for the proposed anesthesia with the patient or authorized representative who has indicated his/her understanding and acceptance.     Dental advisory given  Plan Discussed with: CRNA  Anesthesia Plan Comments:          Anesthesia Quick Evaluation

## 2024-01-12 NOTE — Anesthesia Procedure Notes (Signed)
 Central Venous Catheter Insertion Performed by: Leopoldo Bruckner, MD, anesthesiologist Start/End8/28/2025 7:18 AM, 01/12/2024 7:28 AM Patient location: OR. Preanesthetic checklist: patient identified, IV checked, site marked, risks and benefits discussed, surgical consent, monitors and equipment checked, pre-op evaluation, timeout performed and anesthesia consent Lidocaine  1% used for infiltration and patient sedated Hand hygiene performed  and maximum sterile barriers used  Catheter size: 8.5 Fr Sheath introducer Procedure performed using ultrasound guided technique. Ultrasound Notes:anatomy identified, needle tip was noted to be adjacent to the nerve/plexus identified, no ultrasound evidence of intravascular and/or intraneural injection and image(s) printed for medical record Attempts: 1 Following insertion, line sutured and dressing applied. Post procedure assessment: blood return through all ports, free fluid flow and no air  Patient tolerated the procedure well with no immediate complications.

## 2024-01-12 NOTE — Op Note (Signed)
 [address]       [name] [address]             [phone]                                          01/12/2024 Patient:  Jeffrey [name] Pre-Op Dx: 3V CAD HTN HLP  DM   Post-op Dx:  same Procedure: CABG X 5.  LIMA LAD, RSVG distal RCA, OM1-3 (Y graft)   Endoscopic greater saphenous vein harvest on the right   Surgeon and Role:      * Nesbit Michon, Linnie KIDD, MD - Primary    * B. Raguel , PA-C - assisting An experienced assistant was required given the complexity of this surgery and the standard of surgical care. The assistant was needed for exposure, dissection, suctioning, retraction of delicate tissues and sutures, instrument exchange and for overall help during this procedure.    Anesthesia  general EBL:  Blood Administration: none Xclamp Time:  88 min Pump Time:   Drains: 52 F blake drain: L, mediastinal  Wires: ventricular Counts: correct   Indications: 68 y.o. male with 3V CAD.  Echocardiogram shows preserved biventricular function and no significant valvular disease.  The risks and benefits of CABG 5 were discussed in detail.  The patient is agreeable to proceed.   Findings: Good vein and LIMA.  Calcified dRCA.  Small OM3.  Good OM1 and 2.  Intramyocardial LAD.  Operative Technique: All invasive lines were placed in pre-op holding.  After the risks, benefits and alternatives were thoroughly discussed, the patient was brought to the operative theatre.  Anesthesia was induced, and the patient was prepped and draped in normal sterile fashion.  An appropriate surgical pause was performed, and pre-operative antibiotics were dosed accordingly.  We began with simultaneous incisions along the right leg for harvesting of the greater saphenous vein and the chest for the sternotomy.  In regards to the sternotomy, this was carried down with bovie cautery, and the sternum was divided with a reciprocating saw.  Meticulous hemostasis was  obtained.  The left internal thoracic artery was exposed and harvested in in pedicled fashion.  The patient was systemically heparinized, and the artery was divided distally, and placed in a papaverine  sponge.    The sternal elevator was removed, and a retractor was placed.  The pericardium was divided in the midline and fashioned into a cradle with pericardial stitches.   After we confirmed an appropriate ACT, the ascending aorta was cannulated in standard fashion.  The right atrial appendage was used for venous cannulation site.  Cardiopulmonary bypass was initiated, and the heart retractor was placed. The cross clamp was applied, and a dose of anterograde cardioplegia was given with good arrest of the heart.  We moved to the posterior wall of the heart, and found a good target on the distal RCA.  An arteriotomy was made, and the vein graft was anastomosed to it in an end to side fashion.  Next we exposed the lateral wall, and found a good target on the OM1, 2, and 3..  An end to side anastomosis with the vein graft was then created to each.  Finally, we exposed a good target on the LAD, and fashioned an end to side anastomosis between it and the LITA.  We began to re-warm, and a  re-animation dose of cardioplegia was given.  The heart was de-aired, and the cross clamp was removed.  Meticulous hemostasis was obtained.    A partial occludding clamp was then placed on the ascending aorta, and we created an end to side anastomosis between it and the proximal vein grafts.  The OM3 vein graft was taken off the hood of the OM1 vein graft.  Rings were placed on the proximal anastomosis.  Hemostasis was obtained, and we separated from cardiopulmonary bypass without event.  The heparin  was reversed with protamine .  Chest tubes and wires were placed, and the sternum was re-approximated with sternal wires.  The soft tissue and skin were re-approximated wth absorbable suture.    The patient tolerated the procedure  without any immediate complications, and was transferred to the ICU in guarded condition.  Darris Carachure MALVA Rayas

## 2024-01-12 NOTE — Hospital Course (Addendum)
 History of Present Illness:     Jeffrey [name] is a 68 y.o. 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  He has been symptomatic with exertional angina while playing golf.  He also admits to fatigue, and shortness of breath.  He has a hx of PCI to the circumflex in 2017. Cardiac catheterization on 07/31 showed 60-70% RCA stenosis, 80% 1st marginal stenosis, 80% 2nd marginal stenosis, 90% mid to distal circumflex stenosis, minimal in stent restenosis, and 60-90% LAD stenosis. Echocardiogram on 08/01 showed LVEF 65-70%, trivial mitral valve regurgitation, and mild dilatation of the ascending aorta measuring 40mm.   Dr. Shyrl reviewed the patient's diagnostic studies and determined he would benefit from surgical intervention. He reviewed the patient's treatment options as well as the risks and benefits of surgery. [name] was agreeable to proceed with surgery.  Hospital Course: [name] presented to [facility] and was brought to the operating room on 01/12/24. He underwent CABG x 5 utilizing LIMA to LAD, SVG to OM1, SVG to OM2, SVG to OM3 and SVG to distal RCA as well as endoscopic harvest of the right greater saphenous vein. The left thigh was scoped but the vein was not harvested. He was transferred to the SICU in stable condition. He was extubated the evening of surgery without complication. Drips were weaned as hemodynamics tolerated, arterial line and epicardial pacing wires were removed without complication. He was routinely diuresed. He had PAF and was started on an Amiodarone  drip. He developed a probable ileus and was given laxatives, Zofran , and Reglan . He converted to sinus rhythm and was transitioned to oral Amiodarone .  He continued to have some evidence of PAF and was also began on anticoagulation therapy with Eliquis .  Plavix  was discontinued and he was kept on a baby aspirin .  He has been ambulating on room air with good oxygenation. All wounds are clean, dry, healing without signs  of infection.  His ileus continued to be an issue with significant colonic distention noted on abdominal x-ray.  He was made NPO.  CT scan was obtained which revealed some evidence of colitis at the hepatic flexure without any signs of obstruction..  General surgery was consulted to assist with management.  He has shown a gradual improvement in his diarrhea and diet has slowly had been advanced. He was advanced to a heart healthy diet and tolerated this well. Diarrhea and abdominal distension improved with time. He continued to have paroxysmal atrial fibrillation with controlled rates. He became hypokalemic and potassium was supplemented. He developed phlebitis of his right hand and forearm, Cephalexin  was started. He was ambulating well on room air. His incisions were healing well without sign of infection. He was felt stable for discharge home. As discussed with Dr. Lucas he was discharged home on Eliquis  and Plavix  was not restarted.

## 2024-01-12 NOTE — Brief Op Note (Signed)
 01/12/2024  12:22 PM  PATIENT:  Jeffrey [name]  68 y.o. male  PRE-OPERATIVE DIAGNOSIS:  Coronary Artery Disease  POST-OPERATIVE DIAGNOSIS:  Coronary Artery Disease  PROCEDURE:  Procedure(s): CORONARY ARTERY BYPASS GRAFTING (CABG) TIMES FIVE USING LEFT INTERNAL MAMMARY ARTERY AND ENDOSCOPICALLY HARVESTED RIGHT GREATER SAPHENOUS VEINS. (N/A) ECHOCARDIOGRAM, TRANSESOPHAGEAL (N/A) Vein harvest time: Vein prep time: -LIMA to LAD -SVG to OM1 -SVG to OM2 -SVG to OM3 -SVG to distal RCA  SURGEON:  Surgeons and Role:    * Shyrl Linnie KIDD, MD - Primary  PHYSICIAN ASSISTANT: Con Bend PA-C  ASSISTANTS: Mariel Mayotte RNFA   ANESTHESIA:   general  EBL:  {None/Minimal: 21241}   BLOOD ADMINISTERED:{BLOOD GIVEN TYPES AND AMOUNTS:20467}  DRAINS: Mediastinal and pleural drains   LOCAL MEDICATIONS USED:  NONE  SPECIMEN:  No Specimen  DISPOSITION OF SPECIMEN:  N/A  COUNTS:  YES  TOURNIQUET:  * No tourniquets in log *  DICTATION: .Dragon Dictation  PLAN OF CARE: Admit to inpatient   PATIENT DISPOSITION:  ICU - intubated and hemodynamically stable.   Delay start of Pharmacological VTE agent (>24hrs) due to surgical blood loss or risk of bleeding: yes

## 2024-01-12 NOTE — Procedures (Signed)
 Extubation Procedure Note  Patient Details:   Name: Jeffrey [name] DOB: [birth year] MRN: [id number]   Airway Documentation:    Vent end date: 01/12/24 Vent end time: 1741   Evaluation  O2 sats: stable throughout Complications: Complications of stridor- racemic epi given per CCM  Patient did tolerate procedure well. Bilateral Breath Sounds: Stridor   Yes  Patient extubated per order and protocol to 4L [address]. Patient achieved NIF of -40 and VC of .93L with good effort. Positive cuff leak was noted prior to extubation. Immediately post extubation, PT developed audible stridor. CCM notified and racemic epi neb given with notable improvement. Patient is alert, has strong cough, and is able to weakly speak. Vitals are stable. RT will continue to monitor.   Helon Wisinski CHRISTELLA Anon 01/12/2024, 5:59 PM

## 2024-01-12 NOTE — Anesthesia Procedure Notes (Signed)
 Arterial Line Insertion Start/End8/28/2025 6:40 AM, 01/12/2024 6:45 AM Performed by: Harrold Macintosh, CRNA, CRNA  Patient location: Pre-op. Preanesthetic checklist: patient identified, IV checked, site marked, risks and benefits discussed, surgical consent, monitors and equipment checked, pre-op evaluation, timeout performed and anesthesia consent Lidocaine  1% used for infiltration Left, radial was placed Catheter size: 20 G Hand hygiene performed  and maximum sterile barriers used  Allen's test indicative of satisfactory collateral circulation Attempts: 1 Procedure performed without using ultrasound guided technique. Following insertion, dressing applied and Biopatch. Post procedure assessment: normal and unchanged  Patient tolerated the procedure well with no immediate complications.

## 2024-01-12 NOTE — Consult Note (Signed)
 NAME:  Jeffrey [name], MRN:  [id number], DOB:  11/18/1955, LOS: 0 ADMISSION DATE:  01/12/2024, CONSULTATION DATE:  8/28 REFERRING MD:  lightfoot, CHIEF COMPLAINT:  post CABG   History of Present Illness:  68yo male with hx HLD, GERD, severe multivessel CAD symptomatic with exertional angina, fatigue and SOB. Admitted 8/28 for planned CABG.   Xclamp time:  Pump time: EBL: Blood products n/a Pertinent  Medical History   has a past medical history of Chest wall abscess (2012), Coronary artery disease, Elevated cholesterol, and GERD (gastroesophageal reflux disease).   Significant Hospital Events: Including procedures, antibiotic start and stop dates in addition to other pertinent events     Interim History / Subjective:  Seen immediately post CABG  OR report reviewed  D/w Dr lightfoot  No sig intra-op complications   Objective    Blood pressure (!) 145/75, pulse (!) 52, temperature 98.1 F (36.7 C), temperature source Oral, resp. rate 12, height 5' 10 (1.778 m), weight 110.2 kg, SpO2 96%.    Vent Mode: SIMV;PSV;PRVC FiO2 (%):  [50 %] 50 % Set Rate:  [16 bmp] 16 bmp Vt Set:  [580 mL] 580 mL PEEP:  [5 cmH20] 5 cmH20 Plateau Pressure:  [21 cmH20] 21 cmH20   Intake/Output Summary (Last 24 hours) at 01/12/2024 1439 Last data filed at 01/12/2024 1427 Gross per 24 hour  Intake 4160 ml  Output 3060 ml  Net 1100 ml   Filed Weights   01/12/24 0555  Weight: 110.2 kg    Examination: General: chronically ill appearing male, NAD  HENT: mm moist, ETT Lungs: resps even non labored on vent, diminished bases, clear  Cardiovascular: rrr, sinus 60's, CT x2 Abdomen: round, soft Extremities: warm and dry, no sig edema  Neuro: RASS -3 post CABG  GU: foley   Resolved problem list   Assessment and Plan   Severe multivessel CAD s/p CABG 8/28 P: Post op care per CVTS  Complete txa  Wean norepi as able  Post op labs per protocol  Insulin  gtt per protocol   Peri-op prophylactic abx    Endotracheal intubation post cardiac surgery  P  ABG  CXR Rapid wean per protocol  WUA, SBT  PAD protocol while on vent   Hx HLD  Hx HTN  P:  Resume statin  Holding home amlodipine   Best Practice (right click and Reselect all SmartList Selections daily)   Diet/type: NPO DVT prophylaxis systemic heparin  Pressure ulcer(s): N/A GI prophylaxis: PPI Lines: Central line, Arterial Line, and yes and it is still needed Foley:  Yes, and it is still needed Code Status:  full code Last date of multidisciplinary goals of care discussion [8/28 per CVTS]  Labs   CBC: Recent Labs  Lab 01/11/24 1055 01/12/24 0819 01/12/24 1119 01/12/24 1203 01/12/24 1217 01/12/24 1326 01/12/24 1330  WBC 5.3  --   --   --   --   --   --   HGB 14.2   < > 8.8* 8.8* 8.8* 8.2* 8.8*  HCT 43.8   < > 26.0* 26.7* 26.0* 24.0* 26.0*  MCV 99.1  --   --   --   --   --   --   PLT 222  --   --  146*  --   --   --    < > = values in this interval not displayed.    Basic Metabolic Panel: Recent Labs  Lab 01/11/24 1055 01/12/24 0819 01/12/24 0823 01/12/24 1002  01/12/24 1044 01/12/24 1048 01/12/24 1119 01/12/24 1217 01/12/24 1326 01/12/24 1330  NA 136   < > 143 141   < > 139 138 138 140 141  K 3.7   < > 3.1* 3.3*   < > 4.0 4.4 4.4 3.5 3.5  CL 102  --  109 107  --   --  105 105  --  106  CO2 27  --   --   --   --   --   --   --   --   --   GLUCOSE 90  --  93 107*  --   --  143* 150*  --  124*  BUN 17  --  15 16  --   --  17 18  --  15  CREATININE 1.07  --  0.70 0.80  --   --  0.90 0.80  --  0.80  CALCIUM  9.3  --   --   --   --   --   --   --   --   --    < > = values in this interval not displayed.   GFR: Estimated Creatinine Clearance: 106.8 mL/min (by C-G formula based on SCr of 0.8 mg/dL). Recent Labs  Lab 01/11/24 1055  WBC 5.3    Liver Function Tests: Recent Labs  Lab 01/11/24 1055  AST 35  ALT 33  ALKPHOS 57  BILITOT 0.9  PROT 7.2  ALBUMIN  3.8    No results for input(s): LIPASE, AMYLASE in the last 168 hours. No results for input(s): AMMONIA in the last 168 hours.  ABG    Component Value Date/Time   PHART 7.363 01/12/2024 1326   PCO2ART 37.4 01/12/2024 1326   PO2ART 337 (H) 01/12/2024 1326   HCO3 21.3 01/12/2024 1326   TCO2 23 01/12/2024 1330   ACIDBASEDEF 4.0 (H) 01/12/2024 1326   O2SAT 100 01/12/2024 1326     Coagulation Profile: Recent Labs  Lab 01/11/24 1055  INR 1.0    Cardiac Enzymes: No results for input(s): CKTOTAL, CKMB, CKMBINDEX, TROPONINI in the last 168 hours.  HbA1C: Hgb A1c MFr Bld  Date/Time Value Ref Range Status  01/11/2024 10:55 AM 5.1 4.8 - 5.6 % Final    Comment:    (NOTE)         Prediabetes: 5.7 - 6.4         Diabetes: >6.4         Glycemic control for adults with diabetes: <7.0   10/25/2018 08:33 AM 5.0 4.8 - 5.6 % Final    Comment:             Prediabetes: 5.7 - 6.4          Diabetes: >6.4          Glycemic control for adults with diabetes: <7.0     CBG: No results for input(s): GLUCAP in the last 168 hours.  Review of Systems:   As per HPI - All other systems reviewed and were neg.   Past Medical History:  He,  has a past medical history of Chest wall abscess (2012), Coronary artery disease, Elevated cholesterol, and GERD (gastroesophageal reflux disease).   Surgical History:   Past Surgical History:  Procedure Laterality Date   CARDIAC CATHETERIZATION N/A 02/26/2016   Procedure: Left Heart Cath and Coronary Angiography;  Surgeon: Victory LELON Sharps, MD;  Location: [facility] INVASIVE CV LAB;  Service: Cardiovascular;  Laterality: N/A;   CARDIAC CATHETERIZATION N/A  02/26/2016   Procedure: Coronary Stent Intervention;  Surgeon: Victory LELON Sharps, MD;  Location: [facility] INVASIVE CV LAB;  Service: Cardiovascular;  Laterality: N/A;   COLONOSCOPY  2007   COLONOSCOPY WITH PROPOFOL  N/A 02/10/2016   Procedure: COLONOSCOPY WITH PROPOFOL ;  Surgeon: Gladis MARLA Louder, MD;  Location: [facility]  ENDOSCOPY;  Service: Endoscopy;  Laterality: N/A;   CORONARY ANGIOPLASTY WITH STENT PLACEMENT  02/26/2016   LEFT HEART CATH AND CORONARY ANGIOGRAPHY N/A 12/15/2023   Procedure: LEFT HEART CATH AND CORONARY ANGIOGRAPHY;  Surgeon: Verlin Lonni BIRCH, MD;  Location: [facility] INVASIVE CV LAB;  Service: Cardiovascular;  Laterality: N/A;     Social History:   reports that he quit smoking about 7 years ago. His smoking use included cigars. He has never used smokeless tobacco. He reports current alcohol use of about 18.0 standard drinks of alcohol per week. He reports that he does not use drugs.   Family History:  His family history includes Hypertension in his sister; Stroke in his brother.   Allergies Allergies  Allergen Reactions   Brilinta  [Ticagrelor ] Shortness Of Breath     Home Medications  Prior to Admission medications   Medication Sig Start Date End Date Taking? Authorizing Provider  amLODipine  (NORVASC ) 10 MG tablet Take 1 tablet (10 mg total) by mouth daily. 12/09/23  Yes Chandrasekhar, Mahesh A, MD  aspirin  EC 81 MG tablet Take 81 mg by mouth daily. Swallow whole.   Yes [provider]  atorvastatin  (LIPITOR ) 80 MG tablet TAKE ONE TABLET BY MOUTH DAILY AT 6 PM. Please keep upcoming appt in September 2022 with Dr. Sharps before anymore refills. Thank you 11/11/20  Yes Sharps Victory LELON, MD  clopidogrel  (PLAVIX ) 75 MG tablet TAKE 1 TABLET BY MOUTH EVERY DAY 11/21/20  Yes Sharps Victory LELON, MD  Glycerin-Hypromellose-PEG 400 (DRY EYE RELIEF DROPS OP) Place 1 drop into both eyes daily as needed (Dry eyes).   Yes [provider]  Menthol, Topical Analgesic, (BIOFREEZE EX) Apply 1 Application topically daily as needed (pain).   Yes [provider]  Multiple Vitamin (MULTIVITAMIN WITH MINERALS) TABS tablet Take 1 tablet by mouth daily.   Yes [provider]  nitroGLYCERIN  (NITROSTAT ) 0.4 MG SL tablet Place 1 tablet (0.4 mg total) under the tongue every 5 (five) minutes  as needed for chest pain. 03/22/22  Yes Sharps Victory LELON, MD  sildenafil (REVATIO) 20 MG tablet Take 40 mg by mouth daily as needed (ED). 08/12/20  Yes [provider]     Critical care time:     Rockie Myers, NP Pulmonary/Critical Care Medicine  01/12/2024  2:39 PM   See Amion for personal pager PCCM on call pager [phone] until 7pm. Please call Elink 7p-7a. [phone]

## 2024-01-12 NOTE — Anesthesia Postprocedure Evaluation (Signed)
 Anesthesia Post Note  Patient: Hani Campusano  Procedure(s) Performed: CORONARY ARTERY BYPASS GRAFTING (CABG) TIMES FIVE USING LEFT INTERNAL MAMMARY ARTERY AND ENDOSCOPICALLY HARVESTED RIGHT GREATER SAPHENOUS VEINS. (Chest) ECHOCARDIOGRAM, TRANSESOPHAGEAL     Patient location during evaluation: ICU Anesthesia Type: General Level of consciousness: sedated Pain management: pain level controlled Vital Signs Assessment: post-procedure vital signs reviewed and stable Respiratory status: patient remains intubated per anesthesia plan Cardiovascular status: stable Postop Assessment: no apparent nausea or vomiting Anesthetic complications: no   No notable events documented.  Last Vitals:  Vitals:   01/12/24 0637 01/12/24 0638  BP:    Pulse: (!) 54 (!) 52  Resp:  12  Temp:    SpO2: 98% 96%    Last Pain:  Vitals:   01/12/24 0619  TempSrc:   PainSc: 0-No pain                 Angelyn Osterberg

## 2024-01-12 NOTE — Interval H&P Note (Signed)
 History and Physical Interval Note:  01/12/2024 7:28 AM  Jeffrey [name]  has presented today for surgery, with the diagnosis of CAD.  The various methods of treatment have been discussed with the patient and family. After consideration of risks, benefits and other options for treatment, the patient has consented to  Procedure(s): CORONARY ARTERY BYPASS GRAFTING (CABG) (N/A) ECHOCARDIOGRAM, TRANSESOPHAGEAL (N/A) as a surgical intervention.  The patient's history has been reviewed, patient examined, no change in status, stable for surgery.  I have reviewed the patient's chart and labs.  Questions were answered to the patient's satisfaction.     [name] MALVA Rayas

## 2024-01-12 NOTE — Anesthesia Procedure Notes (Signed)
 Procedure Name: Intubation Date/Time: 01/12/2024 8:04 AM  Performed by: Harrold Macintosh, CRNAPre-anesthesia Checklist: Patient identified, Emergency Drugs available, Suction available and Patient being monitored Patient Re-evaluated:Patient Re-evaluated prior to induction Oxygen Delivery Method: Circle system utilized Preoxygenation: Pre-oxygenation with 100% oxygen Induction Type: IV induction Ventilation: Mask ventilation without difficulty Laryngoscope Size: Miller and 3 Grade View: Grade II Tube type: Oral Tube size: 8.0 mm Number of attempts: 1 Airway Equipment and Method: Stylet Placement Confirmation: ETT inserted through vocal cords under direct vision, positive ETCO2 and breath sounds checked- equal and bilateral Secured at: 23 cm Tube secured with: Tape Dental Injury: Teeth and Oropharynx as per pre-operative assessment

## 2024-01-12 NOTE — Discharge Instructions (Addendum)
Discharge Instructions:  1. You may shower, please wash incisions daily with soap and water and keep dry.  If you wish to cover wounds with dressing you may do so but please keep clean and change daily.  No tub baths or swimming until incisions have completely healed.  If your incisions become red or develop any drainage please call our office at [phone]  2. No Driving until cleared by Dr. Abran Duke office and you are no longer using narcotic pain medications  3. Monitor your weight daily.. Please use the same scale and weigh at same time... If you gain 5-10 lbs in 48 hours with associated lower extremity swelling, please contact our office at [phone]  4. Fever of 101.5 for at least 24 hours with no source, please contact our office at [phone]  5. Activity- up as tolerated, please walk at least 3 times per day.  Avoid strenuous activity, no lifting, pushing, or pulling with your arms over 8-10 lbs for a minimum of 6 weeks  6. If any questions or concerns arise, please do not hesitate to contact our office at [phone]  ------------------------------------------- Information on my medicine - ELIQUIS (apixaban)  Why was Eliquis prescribed for you? Eliquis was prescribed for you to reduce the risk of a blood clot forming that can cause a stroke if you have a medical condition called atrial fibrillation (a type of irregular heartbeat).  What do You need to know about Eliquis ? Take your Eliquis TWICE DAILY - one tablet in the morning and one tablet in the evening with or without food. If you have difficulty swallowing the tablet whole please discuss with your pharmacist how to take the medication safely.  Take Eliquis exactly as prescribed by your doctor and DO NOT stop taking Eliquis without talking to the doctor who prescribed the medication.  Stopping may increase your risk of developing a stroke.  Refill your prescription before you run out.  After discharge, you  should have regular check-up appointments with your healthcare provider that is prescribing your Eliquis.  In the future your dose may need to be changed if your kidney function or weight changes by a significant amount or as you get older.  What do you do if you miss a dose? If you miss a dose, take it as soon as you remember on the same day and resume taking twice daily.  Do not take more than one dose of ELIQUIS at the same time to make up a missed dose.  Important Safety Information A possible side effect of Eliquis is bleeding. You should call your healthcare provider right away if you experience any of the following: ? Bleeding from an injury or your nose that does not stop. ? Unusual colored urine (red or dark brown) or unusual colored stools (red or black). ? Unusual bruising for unknown reasons. ? A serious fall or if you hit your head (even if there is no bleeding).  Some medicines may interact with Eliquis and might increase your risk of bleeding or clotting while on Eliquis. To help avoid this, consult your healthcare provider or pharmacist prior to using any new prescription or non-prescription medications, including herbals, vitamins, non-steroidal anti-inflammatory drugs (NSAIDs) and supplements.  This website has more information on Eliquis (apixaban): http://www.eliquis.com/eliquis/home

## 2024-01-12 NOTE — Progress Notes (Signed)
 Patient ID: Jeffrey [name], male   DOB: [birth year], 68 y.o.   MRN: [id number]   TCTS Evening Rounds:   Hemodynamically stable  I don't think Flow track numbers are accurate.  Extubated.   Urine output good  CT output low  CBC    Component Value Date/Time   WBC 7.7 01/12/2024 1433   RBC 3.31 (L) 01/12/2024 1433   HGB 10.6 (L) 01/12/2024 1433   HGB 13.7 12/09/2023 0942   HCT 32.6 (L) 01/12/2024 1433   HCT 42.2 12/09/2023 0942   PLT 120 (L) 01/12/2024 1433   PLT 237 12/09/2023 0942   MCV 98.5 01/12/2024 1433   MCV 98 (H) 12/09/2023 0942   MCH 32.0 01/12/2024 1433   MCHC 32.5 01/12/2024 1433   RDW 13.3 01/12/2024 1433   RDW 12.5 12/09/2023 0942     BMET    Component Value Date/Time   NA 141 01/12/2024 1330   NA 141 12/09/2023 0942   K 3.5 01/12/2024 1330   CL 106 01/12/2024 1330   CO2 27 01/11/2024 1055   GLUCOSE 124 (H) 01/12/2024 1330   BUN 15 01/12/2024 1330   BUN 14 12/09/2023 0942   CREATININE 0.80 01/12/2024 1330   CREATININE 1.25 04/29/2016 0823   CALCIUM  9.3 01/11/2024 1055   EGFR 74 12/09/2023 0942   GFRNONAA >60 01/11/2024 1055     A/P:  Stable postop course. Continue current plans

## 2024-01-12 NOTE — Transfer of Care (Signed)
 Immediate Anesthesia Transfer of Care Note  Patient: Jeffrey [name]  Procedure(s) Performed: CORONARY ARTERY BYPASS GRAFTING (CABG) TIMES FIVE USING LEFT INTERNAL MAMMARY ARTERY AND ENDOSCOPICALLY HARVESTED RIGHT GREATER SAPHENOUS VEINS. (Chest) ECHOCARDIOGRAM, TRANSESOPHAGEAL  Patient Location: SICU  Anesthesia Type:General  Level of Consciousness: sedated and Patient remains intubated per anesthesia plan  Airway & Oxygen Therapy: Patient placed on Ventilator (see vital sign flow sheet for setting)  Post-op Assessment: Report given to RN and Post -op Vital signs reviewed and stable  Post vital signs: Reviewed and stable  Last Vitals:  Vitals Value Taken Time  BP 115/61   Temp 36.4 C 01/12/24 14:30  Pulse 61 01/12/24 14:30  Resp 16 01/12/24 14:30  SpO2 100 % 01/12/24 14:30  Vitals shown include unfiled device data.  Last Pain:  Vitals:   01/12/24 0619  TempSrc:   PainSc: 0-No pain      Patients Stated Pain Goal: 0 (01/12/24 9380)  Complications: No notable events documented.

## 2024-01-13 ENCOUNTER — Inpatient Hospital Stay (HOSPITAL_COMMUNITY)

## 2024-01-13 ENCOUNTER — Encounter (HOSPITAL_COMMUNITY): Payer: Self-pay | Admitting: Thoracic Surgery (Cardiothoracic Vascular Surgery)

## 2024-01-13 DIAGNOSIS — E785 Hyperlipidemia, unspecified: Secondary | ICD-10-CM

## 2024-01-13 DIAGNOSIS — I2510 Atherosclerotic heart disease of native coronary artery without angina pectoris: Secondary | ICD-10-CM

## 2024-01-13 DIAGNOSIS — I10 Essential (primary) hypertension: Secondary | ICD-10-CM

## 2024-01-13 DIAGNOSIS — Z48812 Encounter for surgical aftercare following surgery on the circulatory system: Secondary | ICD-10-CM | POA: Diagnosis not present

## 2024-01-13 DIAGNOSIS — Z720 Tobacco use: Secondary | ICD-10-CM

## 2024-01-13 DIAGNOSIS — J9811 Atelectasis: Secondary | ICD-10-CM | POA: Diagnosis not present

## 2024-01-13 DIAGNOSIS — Z951 Presence of aortocoronary bypass graft: Secondary | ICD-10-CM | POA: Diagnosis not present

## 2024-01-13 DIAGNOSIS — R0989 Other specified symptoms and signs involving the circulatory and respiratory systems: Secondary | ICD-10-CM | POA: Diagnosis not present

## 2024-01-13 LAB — POCT I-STAT 7, (LYTES, BLD GAS, ICA,H+H)
Acid-base deficit: 3 mmol/L — ABNORMAL HIGH (ref 0.0–2.0)
Acid-base deficit: 7 mmol/L — ABNORMAL HIGH (ref 0.0–2.0)
Acid-base deficit: 7 mmol/L — ABNORMAL HIGH (ref 0.0–2.0)
Acid-base deficit: 5 mmol/L — ABNORMAL HIGH (ref 0.0–2.0)
Bicarbonate: 22.5 mmol/L (ref 20.0–28.0)
Bicarbonate: 18.8 mmol/L — ABNORMAL LOW (ref 20.0–28.0)
Bicarbonate: 20.2 mmol/L (ref 20.0–28.0)
Bicarbonate: 20.6 mmol/L (ref 20.0–28.0)
Calcium, Ion: 1.17 mmol/L (ref 1.15–1.40)
Calcium, Ion: 1.12 mmol/L — ABNORMAL LOW (ref 1.15–1.40)
Calcium, Ion: 1.15 mmol/L (ref 1.15–1.40)
Calcium, Ion: 1.15 mmol/L (ref 1.15–1.40)
HCT: 31 % — ABNORMAL LOW (ref 39.0–52.0)
HCT: 32 % — ABNORMAL LOW (ref 39.0–52.0)
HCT: 33 % — ABNORMAL LOW (ref 39.0–52.0)
HCT: 31 % — ABNORMAL LOW (ref 39.0–52.0)
Hemoglobin: 10.5 g/dL — ABNORMAL LOW (ref 13.0–17.0)
Hemoglobin: 10.9 g/dL — ABNORMAL LOW (ref 13.0–17.0)
Hemoglobin: 11.2 g/dL — ABNORMAL LOW (ref 13.0–17.0)
Hemoglobin: 10.5 g/dL — ABNORMAL LOW (ref 13.0–17.0)
O2 Saturation: 98 %
O2 Saturation: 97 %
O2 Saturation: 98 %
O2 Saturation: 94 %
Patient temperature: 36.4
Patient temperature: 36.5
Patient temperature: 36.7
Patient temperature: 37.1
Potassium: 4.1 mmol/L (ref 3.5–5.1)
Potassium: 4.1 mmol/L (ref 3.5–5.1)
Potassium: 4.2 mmol/L (ref 3.5–5.1)
Potassium: 4.2 mmol/L (ref 3.5–5.1)
Sodium: 141 mmol/L (ref 135–145)
Sodium: 140 mmol/L (ref 135–145)
Sodium: 141 mmol/L (ref 135–145)
Sodium: 140 mmol/L (ref 135–145)
TCO2: 24 mmol/L (ref 22–32)
TCO2: 20 mmol/L — ABNORMAL LOW (ref 22–32)
TCO2: 22 mmol/L (ref 22–32)
TCO2: 22 mmol/L (ref 22–32)
pCO2 arterial: 38.3 mmHg (ref 32–48)
pCO2 arterial: 37.8 mmHg (ref 32–48)
pCO2 arterial: 43.8 mmHg (ref 32–48)
pCO2 arterial: 38.5 mmHg (ref 32–48)
pH, Arterial: 7.374 (ref 7.35–7.45)
pH, Arterial: 7.27 — ABNORMAL LOW (ref 7.35–7.45)
pH, Arterial: 7.302 — ABNORMAL LOW (ref 7.35–7.45)
pH, Arterial: 7.337 — ABNORMAL LOW (ref 7.35–7.45)
pO2, Arterial: 106 mmHg (ref 83–108)
pO2, Arterial: 101 mmHg (ref 83–108)
pO2, Arterial: 123 mmHg — ABNORMAL HIGH (ref 83–108)
pO2, Arterial: 77 mmHg — ABNORMAL LOW (ref 83–108)

## 2024-01-13 LAB — GLUCOSE, CAPILLARY
Glucose-Capillary: 100 mg/dL — ABNORMAL HIGH (ref 70–99)
Glucose-Capillary: 104 mg/dL — ABNORMAL HIGH (ref 70–99)
Glucose-Capillary: 117 mg/dL — ABNORMAL HIGH (ref 70–99)
Glucose-Capillary: 127 mg/dL — ABNORMAL HIGH (ref 70–99)
Glucose-Capillary: 130 mg/dL — ABNORMAL HIGH (ref 70–99)
Glucose-Capillary: 141 mg/dL — ABNORMAL HIGH (ref 70–99)
Glucose-Capillary: 90 mg/dL (ref 70–99)
Glucose-Capillary: 95 mg/dL (ref 70–99)
Glucose-Capillary: 96 mg/dL (ref 70–99)
Glucose-Capillary: 97 mg/dL (ref 70–99)

## 2024-01-13 LAB — CBC
HCT: 31.4 % — ABNORMAL LOW (ref 39.0–52.0)
HCT: 35.1 % — ABNORMAL LOW (ref 39.0–52.0)
Hemoglobin: 10.4 g/dL — ABNORMAL LOW (ref 13.0–17.0)
Hemoglobin: 11.2 g/dL — ABNORMAL LOW (ref 13.0–17.0)
MCH: 32.2 pg (ref 26.0–34.0)
MCH: 32 pg (ref 26.0–34.0)
MCHC: 33.1 g/dL (ref 30.0–36.0)
MCHC: 31.9 g/dL (ref 30.0–36.0)
MCV: 97.2 fL (ref 80.0–100.0)
MCV: 100.3 fL — ABNORMAL HIGH (ref 80.0–100.0)
Platelets: 136 K/uL — ABNORMAL LOW (ref 150–400)
Platelets: 151 K/uL (ref 150–400)
RBC: 3.23 MIL/uL — ABNORMAL LOW (ref 4.22–5.81)
RBC: 3.5 MIL/uL — ABNORMAL LOW (ref 4.22–5.81)
RDW: 13.6 % (ref 11.5–15.5)
RDW: 13.8 % (ref 11.5–15.5)
WBC: 8.5 K/uL (ref 4.0–10.5)
WBC: 11 K/uL — ABNORMAL HIGH (ref 4.0–10.5)
nRBC: 0 % (ref 0.0–0.2)
nRBC: 0 % (ref 0.0–0.2)

## 2024-01-13 LAB — BASIC METABOLIC PANEL WITH GFR
Anion gap: 7 (ref 5–15)
Anion gap: 8 (ref 5–15)
BUN: 16 mg/dL (ref 8–23)
BUN: 20 mg/dL (ref 8–23)
CO2: 22 mmol/L (ref 22–32)
CO2: 25 mmol/L (ref 22–32)
Calcium: 8 mg/dL — ABNORMAL LOW (ref 8.9–10.3)
Calcium: 8.5 mg/dL — ABNORMAL LOW (ref 8.9–10.3)
Chloride: 105 mmol/L (ref 98–111)
Chloride: 104 mmol/L (ref 98–111)
Creatinine, Ser: 1.08 mg/dL (ref 0.61–1.24)
Creatinine, Ser: 1.28 mg/dL — ABNORMAL HIGH (ref 0.61–1.24)
GFR, Estimated: >60 mL/min (ref >60)
GFR, Estimated: >60 mL/min (ref >60)
Glucose, Bld: 106 mg/dL — ABNORMAL HIGH (ref 70–99)
Glucose, Bld: 128 mg/dL — ABNORMAL HIGH (ref 70–99)
Potassium: 4.1 mmol/L (ref 3.5–5.1)
Potassium: 3.9 mmol/L (ref 3.5–5.1)
Sodium: 134 mmol/L — ABNORMAL LOW (ref 135–145)
Sodium: 137 mmol/L (ref 135–145)

## 2024-01-13 LAB — MAGNESIUM
Magnesium: 2.5 mg/dL — ABNORMAL HIGH (ref 1.7–2.4)
Magnesium: 2.6 mg/dL — ABNORMAL HIGH (ref 1.7–2.4)

## 2024-01-13 MED ORDER — FUROSEMIDE 10 MG/ML IJ SOLN
40.0000 mg | Freq: Once | INTRAMUSCULAR | Status: AC
  Administered 2024-01-13: 40 mg via INTRAVENOUS
  Filled 2024-01-13: qty 4

## 2024-01-13 MED ORDER — ENOXAPARIN SODIUM 30 MG/0.3ML IJ SOSY
30.0000 mg | PREFILLED_SYRINGE | Freq: Every day | INTRAMUSCULAR | Status: DC
  Administered 2024-01-13 – 2024-01-18 (×6): 30 mg via SUBCUTANEOUS
  Filled 2024-01-13 (×6): qty 0.3

## 2024-01-13 MED ORDER — INSULIN ASPART 100 UNIT/ML IJ SOLN
0.0000 [IU] | INTRAMUSCULAR | Status: DC
  Administered 2024-01-13 (×3): 2 [IU] via SUBCUTANEOUS
  Administered 2024-01-14: 4 [IU] via SUBCUTANEOUS

## 2024-01-13 MED ORDER — METOPROLOL TARTRATE 25 MG PO TABS
25.0000 mg | ORAL_TABLET | Freq: Two times a day (BID) | ORAL | Status: DC
  Administered 2024-01-13 – 2024-01-21 (×17): 25 mg via ORAL
  Filled 2024-01-13 (×17): qty 1

## 2024-01-13 MED ORDER — ENOXAPARIN SODIUM 30 MG/0.3ML IJ SOSY: 30.0000 mg | PREFILLED_SYRINGE | INTRAMUSCULAR | Status: DC

## 2024-01-13 MED FILL — Lidocaine HCl Local Preservative Free (PF) Inj 2%: INTRAMUSCULAR | Qty: 14 | Status: AC

## 2024-01-13 MED FILL — Potassium Chloride Inj 2 mEq/ML: INTRAVENOUS | Qty: 40 | Status: AC

## 2024-01-13 MED FILL — Heparin Sodium (Porcine) Inj 1000 Unit/ML: Qty: 1000 | Status: AC

## 2024-01-13 NOTE — Progress Notes (Signed)
      [address]       [place] [address]             [phone]                 1 Day Post-Op Procedure(s) (LRB): CORONARY ARTERY BYPASS GRAFTING (CABG) TIMES FIVE USING LEFT INTERNAL MAMMARY ARTERY AND ENDOSCOPICALLY HARVESTED RIGHT GREATER SAPHENOUS VEINS. (N/A) ECHOCARDIOGRAM, TRANSESOPHAGEAL (N/A)   Events: No events _______________________________________________________________ Vitals: BP 122/74 (BP Location: Left Arm)   Pulse (!) 57   Temp 99.7 F (37.6 C) (Bladder)   Resp 19   Ht 5' 10 (1.778 m)   Wt 115.5 kg   SpO2 95%   BMI 36.54 kg/m  Filed Weights   01/12/24 0555 01/13/24 0500  Weight: 110.2 kg 115.5 kg     - Neuro: alert NAD  - Cardiovascular: sinus  Drips: none.   CVP:  [0 mmHg-41 mmHg] 12 mmHg CO:  [2.9 L/min-12 L/min] 8.2 L/min CI:  [1.3 L/min/m2-5.3 L/min/m2] 3.6 L/min/m2  - Pulm: EWOB  ABG    Component Value Date/Time   PHART 7.337 (L) 01/12/2024 1841   PCO2ART 38.5 01/12/2024 1841   PO2ART 77 (L) 01/12/2024 1841   HCO3 20.6 01/12/2024 1841   TCO2 22 01/12/2024 1841   ACIDBASEDEF 5.0 (H) 01/12/2024 1841   O2SAT 94 01/12/2024 1841    - Abd: ND - Extremity: warm  .Intake/Output      08/28 0701 08/29 0700 08/29 0701 08/30 0700   I.V. (mL/kg) 2650.7 (23) 55.5 (0.5)   Blood 910    IV Piggyback 2579.1 89   Total Intake(mL/kg) 6139.8 (53.2) 144.5 (1.3)   Urine (mL/kg/hr) 1817 (0.7) 46 (0.3)   Blood 2100    Chest Tube 223 0   Total Output 4140 46   Net +1999.8 +98.5           _______________________________________________________________ Labs:    Latest Ref Rng & Units 01/13/2024    4:05 AM 01/12/2024    7:43 PM 01/12/2024    6:41 PM  CBC  WBC 4.0 - 10.5 K/uL 8.5  8.0    Hemoglobin 13.0 - 17.0 g/dL 89.5  89.1  89.4   Hematocrit 39.0 - 52.0 % 31.4  33.0  31.0   Platelets 150 - 400 K/uL 136  132        Latest Ref Rng & Units 01/13/2024    4:05 AM 01/12/2024    7:43 PM 01/12/2024    6:41 PM  CMP   Glucose 70 - 99 mg/dL 893  871    BUN 8 - 23 mg/dL 16  17    Creatinine 9.38 - 1.24 mg/dL 8.91  8.96    Sodium 864 - 145 mmol/L 134  137  140   Potassium 3.5 - 5.1 mmol/L 4.1  4.6  4.2   Chloride 98 - 111 mmol/L 105  108    CO2 22 - 32 mmol/L 22  21    Calcium  8.9 - 10.3 mg/dL 8.0  8.1      CXR: PV congestion  _______________________________________________________________  Assessment and Plan: POD 1 s/p CABG  Neuro: pain controlled CV: on A/S/BB.  Will remove A line and pacing wires Pulm: IS, ambulation Renal: creat stable.  Will diurese GI: on diet Heme: stable ID: afebrile Endo: SSI Dispo: possible floor this afternoon.   Jeffrey [name] 01/13/2024 8:13 AM

## 2024-01-13 NOTE — Plan of Care (Signed)
  Problem: Education: Goal: Knowledge of General Education information will improve Description: Including pain rating scale, medication(s)/side effects and non-pharmacologic comfort measures Outcome: Progressing   Problem: Health Behavior/Discharge Planning: Goal: Ability to manage health-related needs will improve Outcome: Progressing   Problem: Clinical Measurements: Goal: Ability to maintain clinical measurements within normal limits will improve Outcome: Progressing Goal: Will remain free from infection Outcome: Progressing Goal: Diagnostic test results will improve Outcome: Progressing Goal: Respiratory complications will improve Outcome: Progressing Goal: Cardiovascular complication will be avoided Outcome: Progressing   Problem: Clinical Measurements: Goal: Will remain free from infection Outcome: Progressing   Problem: Activity: Goal: Risk for activity intolerance will decrease Outcome: Progressing   Problem: Nutrition: Goal: Adequate nutrition will be maintained Outcome: Progressing   Problem: Coping: Goal: Level of anxiety will decrease Outcome: Progressing   Problem: Elimination: Goal: Will not experience complications related to bowel motility Outcome: Progressing Goal: Will not experience complications related to urinary retention Outcome: Progressing   Problem: Pain Managment: Goal: General experience of comfort will improve and/or be controlled Outcome: Progressing   Problem: Safety: Goal: Ability to remain free from injury will improve Outcome: Progressing   Problem: Skin Integrity: Goal: Risk for impaired skin integrity will decrease Outcome: Progressing   Problem: Education: Goal: Will demonstrate proper wound care and an understanding of methods to prevent future damage Outcome: Progressing Goal: Knowledge of disease or condition will improve Outcome: Progressing Goal: Knowledge of the prescribed therapeutic regimen will  improve Outcome: Progressing Goal: Individualized Educational Video(s) Outcome: Progressing   Problem: Activity: Goal: Risk for activity intolerance will decrease Outcome: Progressing   Problem: Cardiac: Goal: Will achieve and/or maintain hemodynamic stability Outcome: Progressing   Problem: Clinical Measurements: Goal: Postoperative complications will be avoided or minimized Outcome: Progressing   Problem: Respiratory: Goal: Respiratory status will improve Outcome: Progressing   Problem: Skin Integrity: Goal: Wound healing without signs and symptoms of infection Outcome: Progressing Goal: Risk for impaired skin integrity will decrease Outcome: Progressing   Problem: Urinary Elimination: Goal: Ability to achieve and maintain adequate renal perfusion and functioning will improve Outcome: Progressing

## 2024-01-13 NOTE — Consult Note (Signed)
 NAME:  Jeffrey [name], MRN:  [id number], DOB:  Apr 17, 1956, LOS: 1 ADMISSION DATE:  01/12/2024, CONSULTATION DATE:  8/28 REFERRING MD:  lightfoot, CHIEF COMPLAINT:  post CABG   History of Present Illness:  68yo male with hx HLD, GERD, severe multivessel CAD symptomatic with exertional angina, fatigue and SOB. Admitted 8/28 for planned CABG.   Xclamp time:  Pump time: EBL: Blood products: 910 cc cellsaver   Pertinent  Medical History   has a past medical history of Chest wall abscess (2012), Coronary artery disease, Elevated cholesterol, and GERD (gastroesophageal reflux disease).  Significant Hospital Events: Including procedures, antibiotic start and stop dates in addition to other pertinent events   8/28 -- cabg x 5 some stridor after extubation  8/29 doing well, art line foley out    Interim History / Subjective:   Some stridor after extubation, improved w racemic    weaned off nicardipine  and nitroglycerin  gtt overnight    Labs reviewed; BMP grossly normal and CBC w expected slight anemia 10.4 and thrombocytopenia 136  Objective    Blood pressure 122/74, pulse (!) 57, temperature 99.7 F (37.6 C), temperature source Bladder, resp. rate 19, height 5' 10 (1.778 m), weight 115.5 kg, SpO2 95%. CVP:  [0 mmHg-41 mmHg] 12 mmHg CO:  [2.9 L/min-12 L/min] 8.2 L/min CI:  [1.3 L/min/m2-5.3 L/min/m2] 3.6 L/min/m2  Vent Mode: PSV;CPAP FiO2 (%):  [36 %-50 %] 36 % Set Rate:  [4 bmp-16 bmp] 4 bmp Vt Set:  [580 mL] 580 mL PEEP:  [5 cmH20] 5 cmH20 Pressure Support:  [10 cmH20] 10 cmH20 Plateau Pressure:  [21 cmH20] 21 cmH20   Intake/Output Summary (Last 24 hours) at 01/13/2024 0831 Last data filed at 01/13/2024 0800 Gross per 24 hour  Intake 6084.31 ml  Output 3936 ml  Net 2148.31 ml   Filed Weights   01/12/24 0555 01/13/24 0500  Weight: 110.2 kg 115.5 kg    Examination:  General: wdwn older adult M NAD  Neuro: AAOx4  HENT: NCAT pink mm  Lungs: CTAb   Cardiovascular: rr chest tube w serosang output  Abdomen: soft ndnt  Extremities: no obvious acute joint deformity  Gu: foley   Resolved problem list   Endotracheally intubated   Assessment and Plan   mvCAD s/p CABG x 5 Expected ABLA Expected thrombocytopenia  HTN Hx HLD Tobacco use  Fevers  P: -post op progression, L/T/D/W per CVTS -- expect likely art line out foley out  -to transition off insulin  gtt 8/29  -complete abx ppx  -statin, ASA, bb  -lasix  40mg      Best Practice (right click and Reselect all SmartList Selections daily)   Diet/type: Regular consistency (see orders) DVT prophylaxis systemic dose LMWH Pressure ulcer(s): N/A GI prophylaxis: PPI Lines: Central line, Arterial Line, and yes and it is still needed -- anticipate likely to dc 8/29 Foley:  Yes, and it is still needed Code Status:  full code Last date of multidisciplinary goals of care discussion [8/28 per CVTS]  Labs   CBC: Recent Labs  Lab 01/11/24 1055 01/12/24 0819 01/12/24 1203 01/12/24 1217 01/12/24 1433 01/12/24 1438 01/12/24 1656 01/12/24 1727 01/12/24 1841 01/12/24 1943 01/13/24 0405  WBC 5.3  --   --   --  7.7  --   --   --   --  8.0 8.5  HGB 14.2   < > 8.8*   < > 10.6*   < > 10.9* 11.2* 10.5* 10.8* 10.4*  HCT 43.8   < >  26.7*   < > 32.6*   < > 32.0* 33.0* 31.0* 33.0* 31.4*  MCV 99.1  --   --   --  98.5  --   --   --   --  98.2 97.2  PLT 222  --  146*  --  120*  --   --   --   --  132* 136*   < > = values in this interval not displayed.    Basic Metabolic Panel: Recent Labs  Lab 01/11/24 1055 01/12/24 0819 01/12/24 1119 01/12/24 1217 01/12/24 1326 01/12/24 1330 01/12/24 1438 01/12/24 1656 01/12/24 1727 01/12/24 1841 01/12/24 1943 01/13/24 0405  NA 136   < > 138 138   < > 141   < > 141 140 140 137 134*  K 3.7   < > 4.4 4.4   < > 3.5   < > 4.1 4.2 4.2 4.6 4.1  CL 102   < > 105 105  --  106  --   --   --   --  108 105  CO2 27  --   --   --   --   --   --   --    --   --  21* 22  GLUCOSE 90   < > 143* 150*  --  124*  --   --   --   --  128* 106*  BUN 17   < > 17 18  --  15  --   --   --   --  17 16  CREATININE 1.07   < > 0.90 0.80  --  0.80  --   --   --   --  1.03 1.08  CALCIUM  9.3  --   --   --   --   --   --   --   --   --  8.1* 8.0*  MG  --   --   --   --   --   --   --   --   --   --   --  2.5*   < > = values in this interval not displayed.   GFR: Estimated Creatinine Clearance: 81 mL/min (by C-G formula based on SCr of 1.08 mg/dL). Recent Labs  Lab 01/11/24 1055 01/12/24 1433 01/12/24 1943 01/13/24 0405  WBC 5.3 7.7 8.0 8.5    Liver Function Tests: Recent Labs  Lab 01/11/24 1055  AST 35  ALT 33  ALKPHOS 57  BILITOT 0.9  PROT 7.2  ALBUMIN  3.8   No results for input(s): LIPASE, AMYLASE in the last 168 hours. No results for input(s): AMMONIA in the last 168 hours.  ABG    Component Value Date/Time   PHART 7.337 (L) 01/12/2024 1841   PCO2ART 38.5 01/12/2024 1841   PO2ART 77 (L) 01/12/2024 1841   HCO3 20.6 01/12/2024 1841   TCO2 22 01/12/2024 1841   ACIDBASEDEF 5.0 (H) 01/12/2024 1841   O2SAT 94 01/12/2024 1841     Coagulation Profile: Recent Labs  Lab 01/11/24 1055 01/12/24 1433  INR 1.0 1.4*    Cardiac Enzymes: No results for input(s): CKTOTAL, CKMB, CKMBINDEX, TROPONINI in the last 168 hours.  HbA1C: Hgb A1c MFr Bld  Date/Time Value Ref Range Status  01/11/2024 10:55 AM 5.1 4.8 - 5.6 % Final    Comment:    (NOTE)         Prediabetes: 5.7 - 6.4  Diabetes: >6.4         Glycemic control for adults with diabetes: <7.0   10/25/2018 08:33 AM 5.0 4.8 - 5.6 % Final    Comment:             Prediabetes: 5.7 - 6.4          Diabetes: >6.4          Glycemic control for adults with diabetes: <7.0     CBG: Recent Labs  Lab 01/13/24 0106 01/13/24 0213 01/13/24 0305 01/13/24 0402 01/13/24 0805  GLUCAP 95 97 90 100* 141*   CCT na  Mod MDM   Ronnald Gave MSN, AGACNP-BC [facility]  Pulmonary/Critical Care Medicine Amion for pager  01/13/2024, 8:31 AM

## 2024-01-13 NOTE — Progress Notes (Signed)
   [address], [place] [address]             [phone]     POD # 1 CABG x 5  Resting comfortably  BP (!) 140/77   Pulse 73   Temp 98.4 F (36.9 C) (Axillary)   Resp 20   Ht 5' 10 (1.778 m)   Wt 115.5 kg   SpO2 95%   BMI 36.54 kg/m  2L [place] 96% No drips   Intake/Output Summary (Last 24 hours) at 01/13/2024 2207 Last data filed at 01/13/2024 2000 Gross per 24 hour  Intake 547.42 ml  Output 1637 ml  Net -1089.58 ml   CBG well controlled K 3.9 Creatinine 1.28 (up slightly)- monitor  Elspeth C. Kerrin, MD [facility] [phone]

## 2024-01-13 NOTE — Plan of Care (Signed)

## 2024-01-13 NOTE — Discharge Summary (Signed)
 [address] [place] [address]             [phone]        Physician Discharge Summary  Patient ID: Jeffrey [name] MRN: [id number] DOB/AGE: 08/09/1955 68 y.o.  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  He has been symptomatic with exertional angina while playing golf.  He also admits to fatigue, and shortness of breath.  He has a hx of PCI to the circumflex in 2017. Cardiac catheterization on 07/31 showed 60-70% RCA stenosis, 80% 1st marginal stenosis, 80% 2nd marginal stenosis, 90% mid to distal circumflex stenosis, minimal in stent restenosis, and 60-90% LAD stenosis. Echocardiogram on 08/01 showed LVEF 65-70%, trivial mitral valve regurgitation, and mild dilatation of the ascending aorta measuring 40mm.   Dr. Shyrl reviewed the patient's diagnostic studies and determined he would benefit from surgical intervention. He reviewed the patient's treatment options as well as the risks and benefits of surgery. Jeffrey [name] was agreeable to proceed with surgery.  Hospital Course: Jeffrey [name] presented to [facility] and was brought to the operating room on  01/12/24. He underwent CABG x 5 utilizing LIMA to LAD, SVG to OM1, SVG to OM2, SVG to OM3 and SVG to distal RCA as well as endoscopic harvest of the right greater saphenous vein. The left thigh was scoped but the vein was not harvested. He was transferred to the SICU in stable condition. He was extubated the evening of surgery without complication. Drips were weaned as hemodynamics tolerated, arterial line and epicardial pacing wires were removed without complication. He was routinely diuresed. He had PAF and was started on an Amiodarone  drip. He developed a probable ileus and was given laxatives, Zofran , and Reglan . He converted to sinus rhythm and was transitioned to oral Amiodarone .  He continued to have some evidence of PAF and was also began on anticoagulation therapy with Eliquis .  Plavix  was discontinued and he was kept on a baby aspirin .  He has been ambulating on room air with good oxygenation. All wounds are clean, dry, healing without signs of infection.  His ileus continued to be an issue with significant colonic distention noted on abdominal x-ray.  He was made NPO.  CT scan was obtained which revealed some evidence of colitis at the hepatic flexure without any signs of obstruction..  General surgery was consulted to assist with management.  He has shown a gradual improvement in his diarrhea and diet has slowly had been advanced. He was advanced to a heart healthy diet and tolerated this well. Diarrhea and abdominal distension improved with time. He continued to have paroxysmal atrial  fibrillation with controlled rates. He became hypokalemic and potassium was supplemented. He developed phlebitis of his right hand and forearm, Cephalexin  was started. He was ambulating well on room air. His incisions were healing well without sign of infection. He was felt stable for discharge home. As discussed with Dr. Lucas he was discharged home on Eliquis  and Plavix  was not restarted.   Consults: PCCM, general  surgery  Significant Diagnostic Studies:  LEFT HEART CATH AND CORONARY ANGIOGRAPHY     Prox RCA lesion is 70% stenosed.   Mid RCA lesion is 60% stenosed.   1st Mrg lesion is 80% stenosed.   2nd Mrg lesion is 80% stenosed.   Mid Cx to Dist Cx lesion is 90% stenosed.   Prox Cx lesion is 20% stenosed.   Ost LAD to Prox LAD lesion is 90% stenosed.   Mid LAD lesion is 60% stenosed.   The left ventricular ejection fraction is 50-55% by visual estimate.   Severe ostial LAD stenosis. Moderate mid LAD stenosis.  Patent proximal Circumflex stent with minimal restenosis. Severe stenosis in the ostium of the first obtuse marginal branch (jailed by the stent). Severe ostial stenosis second obtuse marginal branch. Severe stenosis distal AV groove Circumflex involving the third obtuse marginal branch.  Large dominant RCA with moderately severe mid vessel stenosis Normal LV systolic function.   ECHOCARDIOGRAM REPORT       Patient Name:   Jeffrey [name]  Date of Exam: 12/16/2023  Medical Rec #:  [id number]     Height:       70.0 in  Accession #:    [id number]    Weight:       245.0 lb  Date of Birth:  [birth year]      BSA:          2.275 m  Patient Age:    68 years      BP:           174/84 mmHg  Patient Gender: M             HR:           59 bpm.  Exam Location:  [address]   Procedure: 2D Echo, 3D Echo, Cardiac Doppler, Color Doppler and Strain  Analysis            (Both Spectral and Color Flow Doppler were utilized during             procedure).   Indications:    R07.9 Chest pain                  CAD I25.10    History:        Patient has no prior history of Echocardiogram  examinations.                 Previous Myocardial Infarction, CAD and h/o PCIs, LHC  12/15/23                 Severe 3v CAD including severe ostial LAD stenosis LVEF  55%;                 Risk Factors:Hypertension, Dyslipidemia and Former Smoker.    Sonographer:    Nolon Berg BA, RDCS  Referring Phys: KARRAR  HUSAIN   IMPRESSIONS     1. Left ventricular ejection fraction, by estimation, is 65 to 70%. Left  ventricular ejection fraction by 3D volume is 61 %. The left ventricle has  normal function. The left ventricle  has no regional wall motion  abnormalities. There is mild concentric  left ventricular hypertrophy. Left ventricular diastolic parameters are  consistent with Grade I diastolic dysfunction (impaired relaxation). The  average left ventricular global longitudinal strain is -17.8 %. The global  longitudinal strain is normal.   2. Right ventricular systolic function is normal. The right ventricular  size is normal.   3. Left atrial size was mild to moderately dilated.   4. The mitral valve is abnormal. Trivial mitral valve regurgitation. No  evidence of mitral stenosis.   5. The aortic valve is tricuspid. There is mild calcification of the  aortic valve. Aortic valve regurgitation is not visualized. Aortic valve  sclerosis/calcification is present, without any evidence of aortic  stenosis.   6. Aortic dilatation noted. There is mild dilatation of the ascending  aorta, measuring 40 mm.   7. The inferior vena cava is normal in size with greater than 50%  respiratory variability, suggesting right atrial pressure of 3 mmHg.   FINDINGS   Left Ventricle: Left ventricular ejection fraction, by estimation, is 65  to 70%. Left ventricular ejection fraction by 3D volume is 61 %. The left  ventricle has normal function. The left ventricle has no regional wall  motion abnormalities. The average  left ventricular global longitudinal strain is -17.8 %. Strain was  performed and the global longitudinal strain is normal. The left  ventricular internal cavity size was normal in size. There is mild  concentric left ventricular hypertrophy. Left  ventricular diastolic parameters are consistent with Grade I diastolic  dysfunction (impaired relaxation).   Right Ventricle: The right ventricular  size is normal. No increase in  right ventricular wall thickness. Right ventricular systolic function is  normal.   Left Atrium: Left atrial size was mild to moderately dilated.   Right Atrium: Right atrial size was normal in size.   Pericardium: There is no evidence of pericardial effusion.   Mitral Valve: The mitral valve is abnormal. There is mild calcification of  the mitral valve leaflet(s). Mild mitral annular calcification. Trivial  mitral valve regurgitation. No evidence of mitral valve stenosis.   Tricuspid Valve: The tricuspid valve is normal in structure. Tricuspid  valve regurgitation is not demonstrated. No evidence of tricuspid  stenosis.   Aortic Valve: The aortic valve is tricuspid. There is mild calcification  of the aortic valve. Aortic valve regurgitation is not visualized. Aortic  valve sclerosis/calcification is present, without any evidence of aortic  stenosis.   Pulmonic Valve: The pulmonic valve was normal in structure. Pulmonic valve  regurgitation is trivial. No evidence of pulmonic stenosis.   Aorta: Aortic dilatation noted. There is mild dilatation of the ascending  aorta, measuring 40 mm.   Venous: The inferior vena cava is normal in size with greater than 50%  respiratory variability, suggesting right atrial pressure of 3 mmHg.   IAS/Shunts: No atrial level shunt detected by color flow Doppler.   Additional Comments: 3D was performed not requiring image post processing  on an independent workstation and was normal.     LEFT VENTRICLE  PLAX 2D  LVIDd:         4.90 cm         Diastology  LVIDs:         3.40 cm         LV e' medial:    5.51 cm/s  LV PW:         1.20 cm  LV E/e' medial:  18.7  LV IVS:        0.90 cm         LV e' lateral:   4.10 cm/s  LVOT diam:     2.30 cm         LV E/e' lateral: 25.2  LV SV:         100  LV SV Index:   44              2D Longitudinal  LVOT Area:     4.15 cm        Strain                                  2D Strain GLS   -17.6 %                                 (A4C):                                 2D Strain GLS   -17.6 %                                 (A3C):                                 2D Strain GLS   -18.2 %                                 (A2C):                                 2D Strain GLS   -17.8 %                                 Avg:                                   3D Volume EF                                 LV 3D EF:    Left                                              ventricul                                              ar  ejection                                              fraction                                              by 3D                                              volume is                                              61 %.                                   3D Volume EF:                                 3D EF:        61 %                                 LV EDV:       139 ml                                 LV ESV:       55 ml                                 LV SV:        84 ml   RIGHT VENTRICLE  RV Basal diam:  3.40 cm   LEFT ATRIUM              Index        RIGHT ATRIUM           Index  LA diam:        4.10 cm  1.80 cm/m   RA Area:     14.20 cm  LA Vol (A2C):   103.0 ml 45.27 ml/m  RA Volume:   32.60 ml  14.33 ml/m  LA Vol (A4C):   77.6 ml  34.10 ml/m  LA Biplane Vol: 91.9 ml  40.39 ml/m   AORTIC VALVE  LVOT Vmax:   112.00 cm/s  LVOT Vmean:  72.867 cm/s  LVOT VTI:    0.240 m    AORTA  Ao Root diam: 3.50 cm  Ao Asc diam:  3.85 cm   MITRAL VALVE  MV Area (PHT): 2.59 cm     SHUNTS  MV Decel Time: 293 msec     Systemic VTI:  0.24 m  MV E velocity: 103.17 cm/s  Systemic Diam: 2.30 cm  MV A velocity: 135.00 cm/s  MV  E/A ratio:  0.76   Toribio Fuel MD  Electronically signed by Toribio Fuel MD  Signature Date/Time: 12/16/2023/10:46:01 PM        Final     Treatments: surgery:    01/12/2024 Patient:  Jeffrey [name] Pre-Op Dx: 3V CAD HTN HLP      DM   Post-op Dx:  same Procedure: CABG X 5.  LIMA LAD, RSVG distal RCA, OM1-3 (Y graft)   Endoscopic greater saphenous vein harvest on the right     Surgeon and Role:      * Lightfoot, Linnie KIDD, MD - Primary    * B. Raguel , PA-C - assisting An experienced assistant was required given the complexity of this surgery and the standard of surgical care. The assistant was needed for exposure, dissection, suctioning, retraction of delicate tissues and sutures, instrument exchange and for overall help during this procedure.     Discharge Exam: Blood pressure 126/75, pulse 76, temperature 98.2 F (36.8 C), temperature source Oral, resp. rate 18, height 5' 10 (1.778 m), weight 111 kg, SpO2 95%. General appearance: alert, cooperative, and no distress Neurologic: intact Heart: regular rate and rhythm, S1, S2 normal, no murmur, click, rub or gallop Lungs: slightly diminished bibasilar breath sounds Abdomen: distension, +BS, nontender, no guarding or rebound tenderness Extremities: extremities normal, atraumatic, no cyanosis or edema Wound: Clean and dry EVH and sternal sites without sign of infection. Right wrist IV site bleeding with dressing in place, hot to touch, some erythema and significant swelling of the right hand and forearm, nontender  Discharge Medications:  The patient has been discharged on:   1.Beta Blocker:  Yes [  X ]                              No   [   ]                              If No, reason:  2.Ace Inhibitor/ARB: Yes [   ]                                     No  [  X  ]                                     If No, reason: Soft BP  3.Statin:   Yes [ X  ]                  No  [   ]                  If No, reason:  4.Ecasa:  Yes  [  X ]                  No   [   ]                  If No, reason:  Patient had ACS upon admission: No  Plavix /P2Y12 inhibitor: Yes [   ]  No  [ X ] On Eliquis      Discharge Instructions     Amb Referral to Cardiac Rehabilitation   Complete by: As directed    Diagnosis: CABG   CABG X ___: 5   After initial evaluation and assessments completed: Virtual Based Care may be provided alone or in conjunction with Phase 2 Cardiac Rehab based on patient barriers.: Yes   Intensive Cardiac Rehabilitation (ICR) [facility] location only OR Traditional Cardiac Rehabilitation (TCR) *If criteria for ICR are not met will enroll in TCR ([facility] only): Yes      Allergies as of 01/21/2024       Reactions   Brilinta  [ticagrelor ] Shortness Of Breath        Medication List     STOP taking these medications    clopidogrel  75 MG tablet Commonly known as: PLAVIX    nitroGLYCERIN  0.4 MG SL tablet Commonly known as: NITROSTAT        TAKE these medications    amiodarone  200 MG tablet Commonly known as: PACERONE  Take 1 tablet twice per day for 7 days then take 1 tablet daily thereafter   amLODipine  10 MG tablet Commonly known as: NORVASC  Take 1 tablet (10 mg total) by mouth daily.   aspirin  EC 81 MG tablet Take 81 mg by mouth daily. Swallow whole.   atorvastatin  80 MG tablet Commonly known as: LIPITOR  TAKE ONE TABLET BY MOUTH DAILY AT 6 PM. Please keep upcoming appt in September 2022 with Dr. Claudene before anymore refills. Thank you   BIOFREEZE EX Apply 1 Application topically daily as needed (pain).   cephALEXin  500 MG capsule Commonly known as: KEFLEX  Take 1 capsule (500 mg total) by mouth 4 (four) times daily for 7 days.   DRY EYE RELIEF DROPS OP Place 1 drop into both eyes daily as needed (Dry eyes).   Eliquis  5 MG Tabs tablet Generic drug: apixaban  Take 1 tablet (5 mg total) by mouth 2 (two) times daily.   metoprolol  tartrate 25 MG tablet Commonly known as: LOPRESSOR  Take 1 tablet (25 mg total) by mouth 2 (two) times daily.   multivitamin with minerals Tabs tablet Take 1 tablet by mouth daily.    potassium chloride  SA 20 MEQ tablet Commonly known as: KLOR-CON  M Take 2 tablets (40 mEq total) by mouth once for 1 dose. Take after 12:00PM on 09/06   sildenafil 20 MG tablet Commonly known as: REVATIO Take 40 mg by mouth daily as needed (ED).   traMADol  50 MG tablet Commonly known as: ULTRAM  Take 1 tablet (50 mg total) by mouth every 6 (six) hours as needed for moderate pain (pain score 4-6).        Follow-up Information     Shyrl Linnie KIDD, MD Follow up on 01/27/2024.   Specialty: Cardiothoracic Surgery Why: Virtual follow up appointment is at 2:20PM. Please do NOT come to the office as this is a VIRTUAL appointment, Dr. Shyrl will call you. Contact information: [address], [place] [address] [phone]         West, Katlyn D, NP Follow up on 01/25/2024.   Specialty: Cardiology Why: Cardiology apppointment is at 9:15AM Contact information: [address] [place] [address] [phone]         [facility] Follow up.   Why: TCTS office referal for [facility] follow up- they will contact you on discharge to schedule Contact information: [address] [place], [place] [address] Phone: [phone]  Signed:  Con GORMAN Bend, PA-C  01/21/2024, 1:46 PM

## 2024-01-14 ENCOUNTER — Inpatient Hospital Stay (HOSPITAL_COMMUNITY)

## 2024-01-14 LAB — CBC
HCT: 32 % — ABNORMAL LOW (ref 39.0–52.0)
Hemoglobin: 10.2 g/dL — ABNORMAL LOW (ref 13.0–17.0)
MCH: 31.6 pg (ref 26.0–34.0)
MCHC: 31.9 g/dL (ref 30.0–36.0)
MCV: 99.1 fL (ref 80.0–100.0)
Platelets: 126 K/uL — ABNORMAL LOW (ref 150–400)
RBC: 3.23 MIL/uL — ABNORMAL LOW (ref 4.22–5.81)
RDW: 13.6 % (ref 11.5–15.5)
WBC: 10.5 K/uL (ref 4.0–10.5)
nRBC: 0 % (ref 0.0–0.2)

## 2024-01-14 LAB — GLUCOSE, CAPILLARY
Glucose-Capillary: 117 mg/dL — ABNORMAL HIGH (ref 70–99)
Glucose-Capillary: 161 mg/dL — ABNORMAL HIGH (ref 70–99)
Glucose-Capillary: 116 mg/dL — ABNORMAL HIGH (ref 70–99)
Glucose-Capillary: 128 mg/dL — ABNORMAL HIGH (ref 70–99)
Glucose-Capillary: 139 mg/dL — ABNORMAL HIGH (ref 70–99)
Glucose-Capillary: 127 mg/dL — ABNORMAL HIGH (ref 70–99)

## 2024-01-14 LAB — BASIC METABOLIC PANEL WITH GFR
Anion gap: 9 (ref 5–15)
BUN: 17 mg/dL (ref 8–23)
CO2: 24 mmol/L (ref 22–32)
Calcium: 8.5 mg/dL — ABNORMAL LOW (ref 8.9–10.3)
Chloride: 101 mmol/L (ref 98–111)
Creatinine, Ser: 1.03 mg/dL (ref 0.61–1.24)
GFR, Estimated: >60 mL/min (ref >60)
Glucose, Bld: 128 mg/dL — ABNORMAL HIGH (ref 70–99)
Potassium: 3.8 mmol/L (ref 3.5–5.1)
Sodium: 134 mmol/L — ABNORMAL LOW (ref 135–145)

## 2024-01-14 LAB — MAGNESIUM: Magnesium: 3.1 mg/dL — ABNORMAL HIGH (ref 1.7–2.4)

## 2024-01-14 MED ORDER — AMIODARONE HCL IN DEXTROSE 360-4.14 MG/200ML-% IV SOLN
INTRAVENOUS | Status: AC
  Administered 2024-01-14: 150 mg via INTRAVENOUS
  Filled 2024-01-14: qty 200

## 2024-01-14 MED ORDER — POTASSIUM CHLORIDE CRYS ER 20 MEQ PO TBCR
40.0000 meq | EXTENDED_RELEASE_TABLET | Freq: Once | ORAL | Status: AC
  Administered 2024-01-14: 40 meq via ORAL
  Filled 2024-01-14: qty 2

## 2024-01-14 MED ORDER — INSULIN ASPART 100 UNIT/ML IJ SOLN
0.0000 [IU] | Freq: Three times a day (TID) | INTRAMUSCULAR | Status: DC
  Administered 2024-01-14 – 2024-01-15 (×2): 2 [IU] via SUBCUTANEOUS

## 2024-01-14 MED ORDER — INSULIN ASPART 100 UNIT/ML IJ SOLN: 0.0000 [IU] | Freq: Every day | INTRAMUSCULAR | Status: DC

## 2024-01-14 MED ORDER — AMIODARONE HCL IN DEXTROSE 360-4.14 MG/200ML-% IV SOLN
30.0000 mg/h | INTRAVENOUS | Status: AC
  Administered 2024-01-14 – 2024-01-15 (×2): 30 mg/h via INTRAVENOUS
  Filled 2024-01-14 (×3): qty 200

## 2024-01-14 MED ORDER — AMIODARONE HCL IN DEXTROSE 360-4.14 MG/200ML-% IV SOLN
60.0000 mg/h | INTRAVENOUS | Status: AC
  Administered 2024-01-14 (×2): 60 mg/h via INTRAVENOUS

## 2024-01-14 MED ORDER — LACTULOSE 10 GM/15ML PO SOLN
20.0000 g | Freq: Every day | ORAL | Status: DC | PRN
  Filled 2024-01-14: qty 30

## 2024-01-14 MED ORDER — AMIODARONE LOAD VIA INFUSION
150.0000 mg | Freq: Once | INTRAVENOUS | Status: AC
  Filled 2024-01-14: qty 83.34

## 2024-01-14 MED ORDER — FUROSEMIDE 10 MG/ML IJ SOLN
40.0000 mg | Freq: Once | INTRAMUSCULAR | Status: AC
  Administered 2024-01-14: 40 mg via INTRAVENOUS
  Filled 2024-01-14: qty 4

## 2024-01-14 MED ORDER — METOCLOPRAMIDE HCL 5 MG/ML IJ SOLN
10.0000 mg | Freq: Four times a day (QID) | INTRAMUSCULAR | Status: AC
  Administered 2024-01-14 – 2024-01-15 (×4): 10 mg via INTRAVENOUS
  Filled 2024-01-14 (×4): qty 2

## 2024-01-14 NOTE — Plan of Care (Signed)
  Problem: Activity: Goal: Risk for activity intolerance will decrease Outcome: Progressing   Problem: Pain Managment: Goal: General experience of comfort will improve and/or be controlled Outcome: Progressing   Problem: Activity: Goal: Risk for activity intolerance will decrease Outcome: Progressing   Problem: Respiratory: Goal: Respiratory status will improve Outcome: Progressing

## 2024-01-14 NOTE — Progress Notes (Signed)
 2 Days Post-Op Procedure(s) (LRB): CORONARY ARTERY BYPASS GRAFTING (CABG) TIMES FIVE USING LEFT INTERNAL MAMMARY ARTERY AND ENDOSCOPICALLY HARVESTED RIGHT GREATER SAPHENOUS VEINS. (N/A) ECHOCARDIOGRAM, TRANSESOPHAGEAL (N/A) Subjective: C/o constipation, pain well controlled  Objective: Vital signs in last 24 hours: Temp:  [98.2 F (36.8 C)-100.2 F (37.9 C)] 99.3 F (37.4 C) (08/30 0736) Pulse Rate:  [59-132] 75 (08/30 0700) Cardiac Rhythm: Atrial fibrillation (08/30 0430) Resp:  [13-40] 24 (08/30 0700) BP: (98-156)/(59-128) 118/75 (08/30 0700) SpO2:  [88 %-97 %] 94 % (08/30 0700) Arterial Line BP: (146)/(51) 146/51 (08/29 0830)  Hemodynamic parameters for last 24 hours: CVP:  [8 mmHg-13 mmHg] 13 mmHg CO:  [6.5 L/min] 6.5 L/min CI:  [2.9 L/min/m2] 2.9 L/min/m2  Intake/Output from previous day: 08/29 0701 - 08/30 0700 In: 575.1 [I.V.:186.1; IV Piggyback:389] Out: 1382 [Urine:1272; Chest Tube:110] Intake/Output this shift: No intake/output data recorded.  General appearance: alert, cooperative, and no distress Neurologic: intact Heart: irregularly irregular rhythm Lungs: diminished breath sounds bibasilar Abdomen: distended, hypoactive BS, nontender  Lab Results: Recent Labs    01/13/24 1708 01/14/24 0425  WBC 11.0* 10.5  HGB 11.2* 10.2*  HCT 35.1* 32.0*  PLT 151 126*   BMET:  Recent Labs    01/13/24 1708 01/14/24 0425  NA 137 134*  K 3.9 3.8  CL 104 101  CO2 25 24  GLUCOSE 128* 128*  BUN 20 17  CREATININE 1.28* 1.03  CALCIUM  8.5* 8.5*    PT/INR:  Recent Labs    01/12/24 1433  LABPROT 18.4*  INR 1.4*   ABG    Component Value Date/Time   PHART 7.337 (L) 01/12/2024 1841   HCO3 20.6 01/12/2024 1841   TCO2 22 01/12/2024 1841   ACIDBASEDEF 5.0 (H) 01/12/2024 1841   O2SAT 94 01/12/2024 1841   CBG (last 3)  Recent Labs    01/13/24 2308 01/14/24 0426 01/14/24 0734  GLUCAP 130* 117* 161*    Assessment/Plan: S/P Procedure(s) (LRB): CORONARY  ARTERY BYPASS GRAFTING (CABG) TIMES FIVE USING LEFT INTERNAL MAMMARY ARTERY AND ENDOSCOPICALLY HARVESTED RIGHT GREATER SAPHENOUS VEINS. (N/A) ECHOCARDIOGRAM, TRANSESOPHAGEAL (N/A) POD # 2 NEURO- intact CV- in and out of SR/ A fib  Started on amiodarone  drip  ASA, statin, metoprolol  RESP- continue IS RENAL- creatinine improved, normal @ 1.03  Diurese ENDO- CBG mildly elevated  Change SSi to [facility] and HS Gi-probable ileus  Will give Reglan  x 24 hours  Laxatives Ambulate SCD + enoxaparin  for DVT prophylaxis   LOS: 2 days    Elspeth JAYSON Millers 01/14/2024

## 2024-01-14 NOTE — Progress Notes (Signed)
 01/14/2024 Discussed with RN, reviewed TCTS note: diuresis, reglan , mobility today. Will check on daily and help PRN otherwise formally again Tuesday.  Rolan Sharps MD PCCM

## 2024-01-14 NOTE — Progress Notes (Signed)
      [address]       [place] [address]             [phone]      POD # 2  Sleeping  BP 122/79 (BP Location: Right Arm)   Pulse 72   Temp 99.6 F (37.6 C) (Oral)   Resp 18   Ht 5' 10 (1.778 m)   Wt 115.3 kg   SpO2 93%   BMI 36.47 kg/m     Intake/Output Summary (Last 24 hours) at 01/14/2024 1736 Last data filed at 01/14/2024 1656 Gross per 24 hour  Intake 502.68 ml  Output 430 ml  Net 72.68 ml   Ambulated x 3  In SR on amio  Corrigan Kretschmer C. Kerrin, MD [facility] [phone]

## 2024-01-14 NOTE — Plan of Care (Signed)

## 2024-01-15 LAB — CBC
HCT: 30.1 % — ABNORMAL LOW (ref 39.0–52.0)
Hemoglobin: 9.8 g/dL — ABNORMAL LOW (ref 13.0–17.0)
MCH: 32 pg (ref 26.0–34.0)
MCHC: 32.6 g/dL (ref 30.0–36.0)
MCV: 98.4 fL (ref 80.0–100.0)
Platelets: 133 K/uL — ABNORMAL LOW (ref 150–400)
RBC: 3.06 MIL/uL — ABNORMAL LOW (ref 4.22–5.81)
RDW: 13.8 % (ref 11.5–15.5)
WBC: 8.2 K/uL (ref 4.0–10.5)
nRBC: 0 % (ref 0.0–0.2)

## 2024-01-15 LAB — BASIC METABOLIC PANEL WITH GFR
Anion gap: 8 (ref 5–15)
BUN: 17 mg/dL (ref 8–23)
CO2: 25 mmol/L (ref 22–32)
Calcium: 8.5 mg/dL — ABNORMAL LOW (ref 8.9–10.3)
Chloride: 103 mmol/L (ref 98–111)
Creatinine, Ser: 1.06 mg/dL (ref 0.61–1.24)
GFR, Estimated: >60 mL/min (ref >60)
Glucose, Bld: 111 mg/dL — ABNORMAL HIGH (ref 70–99)
Potassium: 3.4 mmol/L — ABNORMAL LOW (ref 3.5–5.1)
Sodium: 136 mmol/L (ref 135–145)

## 2024-01-15 LAB — GLUCOSE, CAPILLARY
Glucose-Capillary: 129 mg/dL — ABNORMAL HIGH (ref 70–99)
Glucose-Capillary: 104 mg/dL — ABNORMAL HIGH (ref 70–99)
Glucose-Capillary: 112 mg/dL — ABNORMAL HIGH (ref 70–99)
Glucose-Capillary: 106 mg/dL — ABNORMAL HIGH (ref 70–99)

## 2024-01-15 MED ORDER — SODIUM CHLORIDE 0.9% FLUSH: 3.0000 mL | INTRAVENOUS | Status: DC | PRN

## 2024-01-15 MED ORDER — FUROSEMIDE 40 MG PO TABS
40.0000 mg | ORAL_TABLET | Freq: Every day | ORAL | Status: DC
  Administered 2024-01-16 – 2024-01-17 (×2): 40 mg via ORAL
  Filled 2024-01-15 (×2): qty 1

## 2024-01-15 MED ORDER — CLOPIDOGREL BISULFATE 75 MG PO TABS
75.0000 mg | ORAL_TABLET | Freq: Every day | ORAL | Status: DC
  Administered 2024-01-15 – 2024-01-18 (×4): 75 mg via ORAL
  Filled 2024-01-15 (×4): qty 1

## 2024-01-15 MED ORDER — POLYVINYL ALCOHOL 1.4 % OP SOLN: 1.0000 [drp] | Freq: Every day | OPHTHALMIC | Status: DC | PRN

## 2024-01-15 MED ORDER — AMIODARONE HCL 200 MG PO TABS
400.0000 mg | ORAL_TABLET | Freq: Two times a day (BID) | ORAL | Status: DC
  Administered 2024-01-15 – 2024-01-16 (×3): 400 mg via ORAL
  Filled 2024-01-15 (×3): qty 2

## 2024-01-15 MED ORDER — METOCLOPRAMIDE HCL 5 MG PO TABS
10.0000 mg | ORAL_TABLET | Freq: Three times a day (TID) | ORAL | Status: AC
  Administered 2024-01-15 – 2024-01-16 (×3): 10 mg via ORAL
  Filled 2024-01-15 (×3): qty 2

## 2024-01-15 MED ORDER — ~~LOC~~ CARDIAC SURGERY, PATIENT & FAMILY EDUCATION: Freq: Once | Status: AC

## 2024-01-15 MED ORDER — ZOLPIDEM TARTRATE 5 MG PO TABS: 5.0000 mg | ORAL_TABLET | Freq: Every evening | ORAL | Status: DC | PRN

## 2024-01-15 MED ORDER — ALUM & MAG HYDROXIDE-SIMETH 200-200-20 MG/5ML PO SUSP: 15.0000 mL | Freq: Four times a day (QID) | ORAL | Status: DC | PRN

## 2024-01-15 MED ORDER — SODIUM CHLORIDE 0.9 % IV SOLN: 250.0000 mL | INTRAVENOUS | Status: AC | PRN

## 2024-01-15 MED ORDER — POTASSIUM CHLORIDE CRYS ER 20 MEQ PO TBCR
20.0000 meq | EXTENDED_RELEASE_TABLET | ORAL | Status: AC
  Administered 2024-01-15 (×3): 20 meq via ORAL
  Filled 2024-01-15 (×3): qty 1

## 2024-01-15 MED ORDER — INSULIN ASPART 100 UNIT/ML IJ SOLN: 0.0000 [IU] | Freq: Three times a day (TID) | INTRAMUSCULAR | Status: DC

## 2024-01-15 MED ORDER — SODIUM CHLORIDE 0.9% FLUSH
3.0000 mL | Freq: Two times a day (BID) | INTRAVENOUS | Status: DC
  Administered 2024-01-15 – 2024-01-21 (×12): 3 mL via INTRAVENOUS

## 2024-01-15 MED ORDER — POTASSIUM CHLORIDE CRYS ER 10 MEQ PO TBCR: 20.0000 meq | EXTENDED_RELEASE_TABLET | Freq: Every day | ORAL | Status: DC

## 2024-01-15 NOTE — Progress Notes (Signed)
 3 Days Post-Op Procedure(s) (LRB): CORONARY ARTERY BYPASS GRAFTING (CABG) TIMES FIVE USING LEFT INTERNAL MAMMARY ARTERY AND ENDOSCOPICALLY HARVESTED RIGHT GREATER SAPHENOUS VEINS. (N/A) ECHOCARDIOGRAM, TRANSESOPHAGEAL (N/A) Subjective: Feels wel.  No BM , but + flatus  Objective: Vital signs in last 24 hours: Temp:  [98.4 F (36.9 C)-99.6 F (37.6 C)] 98.4 F (36.9 C) (08/31 0715) Pulse Rate:  [59-76] 76 (08/31 0700) Cardiac Rhythm: Normal sinus rhythm;Sinus bradycardia (08/31 0400) Resp:  [13-32] 24 (08/31 0700) BP: (103-149)/(60-91) 123/68 (08/31 0700) SpO2:  [90 %-99 %] 93 % (08/31 0700) Weight:  [113.1 kg-115.3 kg] 113.1 kg (08/31 0500)  Hemodynamic parameters for last 24 hours:    Intake/Output from previous day: 08/30 0701 - 08/31 0700 In: 444.5 [I.V.:444.5] Out: 595 [Urine:595] Intake/Output this shift: No intake/output data recorded.  General appearance: alert, cooperative, and no distress Neurologic: intact Heart: regular rate and rhythm Lungs: diminished breath sounds bibasilar Abdomen: distended, nontender, improved BS  Lab Results: Recent Labs    01/14/24 0425 01/15/24 0423  WBC 10.5 8.2  HGB 10.2* 9.8*  HCT 32.0* 30.1*  PLT 126* 133*   BMET:  Recent Labs    01/14/24 0425 01/15/24 0423  NA 134* 136  K 3.8 3.4*  CL 101 103  CO2 24 25  GLUCOSE 128* 111*  BUN 17 17  CREATININE 1.03 1.06  CALCIUM  8.5* 8.5*    PT/INR:  Recent Labs    01/12/24 1433  LABPROT 18.4*  INR 1.4*   ABG    Component Value Date/Time   PHART 7.337 (L) 01/12/2024 1841   HCO3 20.6 01/12/2024 1841   TCO2 22 01/12/2024 1841   ACIDBASEDEF 5.0 (H) 01/12/2024 1841   O2SAT 94 01/12/2024 1841   CBG (last 3)  Recent Labs    01/14/24 1906 01/14/24 2055 01/15/24 0617  GLUCAP 139* 127* 129*    Assessment/Plan: S/P Procedure(s) (LRB): CORONARY ARTERY BYPASS GRAFTING (CABG) TIMES FIVE USING LEFT INTERNAL MAMMARY ARTERY AND ENDOSCOPICALLY HARVESTED RIGHT GREATER  SAPHENOUS VEINS. (N/A) ECHOCARDIOGRAM, TRANSESOPHAGEAL (N/A) POD # 3 NEURO- intact CV- in Sr with IV amiodarone   Convert amiodarone  to PO  Metoprolol   ASA, resume Plavix   statin RESP- continue IS RENAL- creatinine normal  Hypokalemia- supplement K  PO Lasix  ENDO- CBG well controlled Gi- ileus.  Tolerating POs and improved BS  Continue Reglan  PO SCD + enoxaparin  Cardiac rehab Transfer to 4E   LOS: 3 days    Elspeth JAYSON Millers 01/15/2024

## 2024-01-15 NOTE — Plan of Care (Signed)
   Problem: Clinical Measurements: Goal: Will remain free from infection Outcome: Progressing Goal: Diagnostic test results will improve Outcome: Progressing

## 2024-01-16 ENCOUNTER — Inpatient Hospital Stay (HOSPITAL_COMMUNITY)

## 2024-01-16 LAB — GLUCOSE, CAPILLARY
Glucose-Capillary: 97 mg/dL (ref 70–99)
Glucose-Capillary: 102 mg/dL — ABNORMAL HIGH (ref 70–99)
Glucose-Capillary: 118 mg/dL — ABNORMAL HIGH (ref 70–99)
Glucose-Capillary: 167 mg/dL — ABNORMAL HIGH (ref 70–99)

## 2024-01-16 LAB — BASIC METABOLIC PANEL WITH GFR
Anion gap: 10 (ref 5–15)
BUN: 15 mg/dL (ref 8–23)
CO2: 26 mmol/L (ref 22–32)
Calcium: 8.6 mg/dL — ABNORMAL LOW (ref 8.9–10.3)
Chloride: 103 mmol/L (ref 98–111)
Creatinine, Ser: 0.98 mg/dL (ref 0.61–1.24)
GFR, Estimated: >60 mL/min (ref >60)
Glucose, Bld: 104 mg/dL — ABNORMAL HIGH (ref 70–99)
Potassium: 3.5 mmol/L (ref 3.5–5.1)
Sodium: 139 mmol/L (ref 135–145)

## 2024-01-16 MED ORDER — POTASSIUM CHLORIDE CRYS ER 20 MEQ PO TBCR
40.0000 meq | EXTENDED_RELEASE_TABLET | Freq: Two times a day (BID) | ORAL | Status: AC
  Administered 2024-01-16 (×2): 40 meq via ORAL
  Filled 2024-01-16 (×2): qty 2

## 2024-01-16 MED ORDER — AMIODARONE HCL IN DEXTROSE 360-4.14 MG/200ML-% IV SOLN
30.0000 mg/h | INTRAVENOUS | Status: DC
  Administered 2024-01-16 – 2024-01-18 (×5): 30 mg/h via INTRAVENOUS
  Filled 2024-01-16 (×4): qty 200

## 2024-01-16 MED ORDER — AMIODARONE IV BOLUS ONLY 150 MG/100ML
INTRAVENOUS | Status: AC
  Administered 2024-01-16: 150 mg
  Filled 2024-01-16: qty 100

## 2024-01-16 MED ORDER — POTASSIUM CHLORIDE CRYS ER 20 MEQ PO TBCR: 20.0000 meq | EXTENDED_RELEASE_TABLET | Freq: Every day | ORAL | Status: DC

## 2024-01-16 MED ORDER — AMIODARONE HCL IN DEXTROSE 360-4.14 MG/200ML-% IV SOLN
60.0000 mg/h | INTRAVENOUS | Status: AC
  Administered 2024-01-16 (×2): 60 mg/h via INTRAVENOUS
  Filled 2024-01-16 (×2): qty 200

## 2024-01-16 MED ORDER — AMIODARONE IV BOLUS ONLY 150 MG/100ML: 150.0000 mg | Freq: Once | INTRAVENOUS | Status: DC

## 2024-01-16 MED ORDER — ASPIRIN 81 MG PO TBEC
81.0000 mg | DELAYED_RELEASE_TABLET | Freq: Every day | ORAL | Status: DC
  Administered 2024-01-17 – 2024-01-21 (×5): 81 mg via ORAL
  Filled 2024-01-16 (×6): qty 1

## 2024-01-16 MED ORDER — ASPIRIN 81 MG PO CHEW
81.0000 mg | CHEWABLE_TABLET | Freq: Every day | ORAL | Status: DC
  Administered 2024-01-16: 81 mg via ORAL
  Filled 2024-01-16: qty 1

## 2024-01-16 NOTE — Progress Notes (Addendum)
                  [address]           Thurmon BROCKS [place], [place] [address]                     [phone]        4 Days Post-Op Procedure(s) (LRB): CORONARY ARTERY BYPASS GRAFTING (CABG) TIMES FIVE USING LEFT INTERNAL MAMMARY ARTERY AND ENDOSCOPICALLY HARVESTED RIGHT GREATER SAPHENOUS VEINS. (N/A) ECHOCARDIOGRAM, TRANSESOPHAGEAL (N/A)  Subjective: Patient had a small, liquid like bowel movement and he is passing flatus. He denies abdominal pain or nausea.  Objective: Vital signs in last 24 hours: Temp:  [97.7 F (36.5 C)-99.3 F (37.4 C)] 99.3 F (37.4 C) (09/01 0500) Pulse Rate:  [63-75] 74 (08/31 1937) Cardiac Rhythm: Normal sinus rhythm (08/31 2130) Resp:  [14-29] 24 (09/01 0541) BP: (123-137)/(59-85) 137/78 (09/01 0500) SpO2:  [90 %-95 %] 95 % (08/31 1107) Weight:  [886 kg] 113 kg (09/01 0500)  Pre op weight 110.2 kg Current Weight  01/16/24 113 kg      Intake/Output from previous day: 08/31 0701 - 09/01 0700 In: 546.6 [P.O.:480; I.V.:66.6] Out: -    Physical Exam:  Cardiovascular: RRR Pulmonary: Clear to auscultation bilaterally Abdomen: Soft, non tender, some distention, sporadic bowel sounds Extremities: Mild bilateral lower extremity edema. Wounds: Clean and dry.  No erythema or signs of infection.  Lab Results: CBC: Recent Labs    01/14/24 0425 01/15/24 0423  WBC 10.5 8.2  HGB 10.2* 9.8*  HCT 32.0* 30.1*  PLT 126* 133*   BMET:  Recent Labs    01/15/24 0423 01/16/24 0455  NA 136 139  K 3.4* 3.5  CL 103 103  CO2 25 26  GLUCOSE 111* 104*  BUN 17 15  CREATININE 1.06 0.98  CALCIUM  8.5* 8.6*    PT/INR:  Lab Results  Component Value Date   INR 1.4 (H) 01/12/2024   INR 1.0 01/11/2024   INR 1.0 02/19/2016   ABG:  INR: Will add last result for INR, ABG once components are confirmed Will add last 4 CBG results once components are confirmed  Assessment/Plan:  1. CV - Previous a fib. SR this am. On Amiodarone  400 mg bid,  Lopressor  25 mg bid, Plavix  restarted 08/31 as was on prior to surgery. Will decrease ec asa to 81 mg daily. 2.  Pulmonary - On room air. Encourage incentive spirometer. 3. Above pre op weight, requires further diuresis 4.  Expected post op acute blood loss anemia - H and H yesterday stable at 9.8 and 30.1 5. CBGs 117/129/97. Pre op HGA1C 5.1. Will stop accu checks and SS PRN 6. Supplement potassium 7. Mild thrombocytopenia-platelets up to 133,000 yesterday 8. On Lovenox  for DVT prophylaxis 9. GI-improving ileus. Limit narcotics. Ambulate 10. Deconditioned-continue with ambulation 11. Disposition-possible discharge in am  Donielle M ZimmermanPA-C 8:01 AM Patient seen and examined, agree with above In Sr on amiodarone  Ileus persists but is improving Home when GI issues resolved  Elspeth C. Kerrin, MD [facility] [phone]

## 2024-01-16 NOTE — Progress Notes (Addendum)
   01/16/24 1505  Assess: MEWS Score  Temp 98.2 F (36.8 C)  BP (!) 143/80  MAP (mmHg) 121  ECG Heart Rate (!) 130  Resp 20  Level of Consciousness Alert  O2 Device Room Air  Assess: MEWS Score  MEWS Temp 0  MEWS Systolic 0  MEWS Pulse 3  MEWS RR 0  MEWS LOC 0  MEWS Score 3  MEWS Score Color Yellow  Assess: if the MEWS score is Yellow or Red  Were vital signs accurate and taken at a resting state? Yes  Does the patient meet 2 or more of the SIRS criteria? No  MEWS guidelines implemented  Yes, yellow  Treat  MEWS Interventions Considered administering scheduled or prn medications/treatments as ordered  Take Vital Signs  Increase Vital Sign Frequency  Yellow: Q2hr x1, continue Q4hrs until patient remains green for 12hrs  Escalate  MEWS: Escalate Yellow: Discuss with charge nurse and consider notifying provider and/or RRT  Notify: Charge Nurse/RN  Name of Charge Nurse/RN Notified Redell  Provider Notification  Provider Name/Title Dwan  Date Provider Notified 01/16/24  Time Provider Notified 1505  Method of Notification Call  Notification Reason New onset of dysrhythmia  Provider response See new orders  Date of Provider Response 01/16/24  Time of Provider Response 1505  Assess: SIRS CRITERIA  SIRS Temperature  0  SIRS Respirations  0  SIRS Pulse 1  SIRS WBC 0  SIRS Score Sum  1

## 2024-01-16 NOTE — Progress Notes (Signed)
 Dwan, PA notified about patient going back into A.fib. Will give 150mg  IV bolus of Amiodarone  and continue to monitor.

## 2024-01-17 ENCOUNTER — Inpatient Hospital Stay (HOSPITAL_COMMUNITY)

## 2024-01-17 LAB — BASIC METABOLIC PANEL WITH GFR
Anion gap: 10 (ref 5–15)
BUN: 14 mg/dL (ref 8–23)
CO2: 25 mmol/L (ref 22–32)
Calcium: 8.6 mg/dL — ABNORMAL LOW (ref 8.9–10.3)
Chloride: 104 mmol/L (ref 98–111)
Creatinine, Ser: 1.02 mg/dL (ref 0.61–1.24)
GFR, Estimated: >60 mL/min (ref >60)
Glucose, Bld: 111 mg/dL — ABNORMAL HIGH (ref 70–99)
Potassium: 3.2 mmol/L — ABNORMAL LOW (ref 3.5–5.1)
Sodium: 139 mmol/L (ref 135–145)

## 2024-01-17 LAB — GLUCOSE, CAPILLARY
Glucose-Capillary: 110 mg/dL — ABNORMAL HIGH (ref 70–99)
Glucose-Capillary: 101 mg/dL — ABNORMAL HIGH (ref 70–99)

## 2024-01-17 LAB — MAGNESIUM: Magnesium: 2 mg/dL (ref 1.7–2.4)

## 2024-01-17 MED ORDER — BISACODYL 10 MG RE SUPP
10.0000 mg | Freq: Once | RECTAL | Status: AC
  Administered 2024-01-17: 10 mg via RECTAL
  Filled 2024-01-17: qty 1

## 2024-01-17 MED ORDER — AMLODIPINE BESYLATE 10 MG PO TABS
10.0000 mg | ORAL_TABLET | Freq: Every day | ORAL | Status: DC
  Administered 2024-01-17 – 2024-01-21 (×5): 10 mg via ORAL
  Filled 2024-01-17 (×5): qty 1

## 2024-01-17 MED ORDER — POTASSIUM CHLORIDE CRYS ER 20 MEQ PO TBCR
40.0000 meq | EXTENDED_RELEASE_TABLET | Freq: Two times a day (BID) | ORAL | Status: AC
  Administered 2024-01-17 (×2): 40 meq via ORAL
  Filled 2024-01-17 (×2): qty 2

## 2024-01-17 NOTE — Progress Notes (Signed)
 Mobility Specialist Progress Note:    01/17/24 1030  Mobility  Activity Ambulated with assistance  Level of Assistance Minimal assist, patient does 75% or more  Assistive Device None  Distance Ambulated (ft) 500 ft  RUE Weight Bearing Per Provider Order NWB  LUE Weight Bearing Per Provider Order NWB  Activity Response Tolerated well  Mobility Referral Yes  Mobility visit 1 Mobility  Mobility Specialist Start Time (ACUTE ONLY) 1030  Mobility Specialist Stop Time (ACUTE ONLY) 1047  Mobility Specialist Time Calculation (min) (ACUTE ONLY) 17 min   Pt received in WC after returning from procedure. Agreeable to ambulate in hallway. Followed sternal precautions throughout. Tolerated well, audible SOB, VSS . Pt returned to room, requested to use bathroom, void successful. MinA to stand from low toilet seat. New gown donned. Pt lying down with all needs met, call bell in reach.    Arlan Birks Mobility Specialist Please contact via Special educational needs teacher or  Rehab office at [phone]

## 2024-01-17 NOTE — Progress Notes (Addendum)
 5 Days Post-Op Procedure(s) (LRB): CORONARY ARTERY BYPASS GRAFTING (CABG) TIMES FIVE USING LEFT INTERNAL MAMMARY ARTERY AND ENDOSCOPICALLY HARVESTED RIGHT GREATER SAPHENOUS VEINS. (N/A) ECHOCARDIOGRAM, TRANSESOPHAGEAL (N/A) Subjective: Mild SOB when he was in afib  Objective: Vital signs in last 24 hours: Temp:  [97.7 F (36.5 C)-98.4 F (36.9 C)] 97.7 F (36.5 C) (09/02 0530) Pulse Rate:  [65-82] 71 (09/02 0618) Cardiac Rhythm: Normal sinus rhythm (09/01 1944) Resp:  [16-29] 18 (09/02 0530) BP: (103-161)/(67-129) 161/129 (09/02 0530) SpO2:  [98 %-100 %] 100 % (09/02 0618) Weight:  [113.5 kg] 113.5 kg (09/02 0618)  Hemodynamic parameters for last 24 hours:    Intake/Output from previous day: 09/01 0701 - 09/02 0700 In: 951.2 [P.O.:720; I.V.:231.2] Out: -  Intake/Output this shift: No intake/output data recorded.  General appearance: alert, cooperative, and no distress Heart: regular rate and rhythm Lungs: dim in bases Abdomen: + distension, high pitched BS, non tender Extremities: minor edema Wound: incis healing well  Lab Results: Recent Labs    01/15/24 0423  WBC 8.2  HGB 9.8*  HCT 30.1*  PLT 133*   BMET:  Recent Labs    01/16/24 0455 01/17/24 0417  NA 139 139  K 3.5 3.2*  CL 103 104  CO2 26 25  GLUCOSE 104* 111*  BUN 15 14  CREATININE 0.98 1.02  CALCIUM  8.6* 8.6*    PT/INR: No results for input(s): LABPROT, INR in the last 72 hours. ABG    Component Value Date/Time   PHART 7.337 (L) 01/12/2024 1841   HCO3 20.6 01/12/2024 1841   TCO2 22 01/12/2024 1841   ACIDBASEDEF 5.0 (H) 01/12/2024 1841   O2SAT 94 01/12/2024 1841   CBG (last 3)  Recent Labs    01/16/24 1703 01/16/24 2103 01/17/24 0619  GLUCAP 118* 167* 110*    Meds Scheduled Meds:  acetaminophen   1,000 mg Oral Q6H   Or   acetaminophen  (TYLENOL ) oral liquid 160 mg/5 mL  1,000 mg Oral Q6H   aspirin  EC  81 mg Oral Daily   Or   aspirin   81 mg Oral Daily   atorvastatin   80 mg  Oral Daily   bisacodyl   10 mg Oral Daily   Or   bisacodyl   10 mg Rectal Daily   Chlorhexidine  Gluconate Cloth  6 each Topical Daily   clopidogrel   75 mg Oral Daily   docusate sodium   200 mg Oral Daily   enoxaparin  (LOVENOX ) injection  30 mg Subcutaneous QHS   furosemide   40 mg Oral Daily   metoprolol  tartrate  25 mg Oral BID   pantoprazole   40 mg Oral Daily   potassium chloride   20 mEq Oral Daily   sodium chloride  flush  3 mL Intravenous Q12H   Continuous Infusions:  amiodarone  30 mg/hr (01/16/24 2353)   amiodarone      PRN Meds:.alum & mag hydroxide-simeth, artificial tears, lactulose , metoprolol  tartrate, ondansetron  (ZOFRAN ) IV, mouth rinse, oxyCODONE , sodium chloride  flush, traMADol , zolpidem   Xrays DG Chest 2 View Result Date: 01/16/2024 CLINICAL DATA:  68 year old male status post CABG postoperative day 4. EXAM: CHEST - 2 VIEW COMPARISON:  01/14/2024 portable exam and earlier. FINDINGS: PA and lateral views 0556 hours. Right IJ introducer sheath removed. Larger lung volumes. Stable cardiomegaly and mediastinal contours. Visualized tracheal air column is within normal limits. No pneumothorax or pulmonary edema. Trace pleural fluid in the fissures, small volume layering fluid suspected in the posterior costophrenic angles. Mild perihilar atelectasis. No consolidation. Sternotomy. No new osseous abnormality identified. Gas-filled bowel  in the upper abdomen, including transverse colon and splenic flexure, not definitely dilated. IMPRESSION: 1. Right IJ introducer sheath removed. 2. Improved lung volumes with small pleural effusions, mild atelectasis. 3. Visible bowel-gas pattern suggests possible postoperative ileus. Electronically Signed   By: VEAR Hurst M.D.   On: 01/16/2024 08:14    Assessment/Plan: S/P Procedure(s) (LRB): CORONARY ARTERY BYPASS GRAFTING (CABG) TIMES FIVE USING LEFT INTERNAL MAMMARY ARTERY AND ENDOSCOPICALLY HARVESTED RIGHT GREATER SAPHENOUS VEINS. (N/A) ECHOCARDIOGRAM,  TRANSESOPHAGEAL (N/A) POD#5  1 afeb, s BP quite  variable 100-160's, in higher range often- will resume home norvasc , in sinus- on amio gtt currently at 30/hr- leave for now as had RVR 2 sats good on RA 3 good UOP- voiding, not all measured, weight about 3 kg > preop, cont daily lasix  4 + BM's,mostly liquid,  has had Ileus, appears to have significant stool burden on yest CXR, remains significantly distended, limiting pain meds and encouraging ambulation- will obtain AXR, may need to consider NPO but eating a little without nausea or vomiting.  5 K+ 3.2- replace, MG++2.0 6 normal renal fxn 7 lovenox  for DVT ppx 8 cont pulm hygiene and rehab   Addendum: Reviewed the abdominal x-ray films.  Patient reading is pending.  The gastric bubble is somewhat enlarged.  There is some small bowel air-fluid levels.  This is likely primarily an ileus but I am concerned there could be a component of small bowel obstruction.  Will make n.p.o.  Except for meds with sips of water .  Will try Dulcolax suppository and possibly may need enema.    LOS: 5 days    Lemond FORBES Cera PA-C Pager [phone]  01/17/2024   Continue ambulation Remains distended. Will f/u abdomina films On amio gtt  Ansel Ferrall O Greycen Felter

## 2024-01-18 ENCOUNTER — Inpatient Hospital Stay (HOSPITAL_COMMUNITY)

## 2024-01-18 LAB — CBC
HCT: 29.2 % — ABNORMAL LOW (ref 39.0–52.0)
Hemoglobin: 9.6 g/dL — ABNORMAL LOW (ref 13.0–17.0)
MCH: 31.9 pg (ref 26.0–34.0)
MCHC: 32.9 g/dL (ref 30.0–36.0)
MCV: 97 fL (ref 80.0–100.0)
Platelets: 244 K/uL (ref 150–400)
RBC: 3.01 MIL/uL — ABNORMAL LOW (ref 4.22–5.81)
RDW: 14 % (ref 11.5–15.5)
WBC: 7.7 K/uL (ref 4.0–10.5)
nRBC: 0 % (ref 0.0–0.2)

## 2024-01-18 LAB — MAGNESIUM: Magnesium: 1.9 mg/dL (ref 1.7–2.4)

## 2024-01-18 LAB — BASIC METABOLIC PANEL WITH GFR
Anion gap: 9 (ref 5–15)
BUN: 14 mg/dL (ref 8–23)
CO2: 26 mmol/L (ref 22–32)
Calcium: 8.5 mg/dL — ABNORMAL LOW (ref 8.9–10.3)
Chloride: 102 mmol/L (ref 98–111)
Creatinine, Ser: 1.05 mg/dL (ref 0.61–1.24)
GFR, Estimated: >60 mL/min (ref >60)
Glucose, Bld: 104 mg/dL — ABNORMAL HIGH (ref 70–99)
Potassium: 3.4 mmol/L — ABNORMAL LOW (ref 3.5–5.1)
Sodium: 137 mmol/L (ref 135–145)

## 2024-01-18 MED ORDER — IOHEXOL 350 MG/ML SOLN
75.0000 mL | Freq: Once | INTRAVENOUS | Status: AC | PRN
  Administered 2024-01-18: 75 mL via INTRAVENOUS

## 2024-01-18 MED ORDER — POTASSIUM CHLORIDE CRYS ER 20 MEQ PO TBCR
40.0000 meq | EXTENDED_RELEASE_TABLET | Freq: Every day | ORAL | Status: DC
  Administered 2024-01-18: 40 meq via ORAL
  Filled 2024-01-18: qty 2

## 2024-01-18 MED ORDER — IOHEXOL 9 MG/ML PO SOLN
500.0000 mL | ORAL | Status: AC
  Administered 2024-01-18 (×2): 500 mL via ORAL

## 2024-01-18 MED ORDER — MAGNESIUM OXIDE -MG SUPPLEMENT 400 (240 MG) MG PO TABS
400.0000 mg | ORAL_TABLET | Freq: Every day | ORAL | Status: DC
  Administered 2024-01-18 – 2024-01-21 (×4): 400 mg via ORAL
  Filled 2024-01-18 (×4): qty 1

## 2024-01-18 NOTE — Progress Notes (Addendum)
 6 Days Post-Op Procedure(s) (LRB): CORONARY ARTERY BYPASS GRAFTING (CABG) TIMES FIVE USING LEFT INTERNAL MAMMARY ARTERY AND ENDOSCOPICALLY HARVESTED RIGHT GREATER SAPHENOUS VEINS. (N/A) ECHOCARDIOGRAM, TRANSESOPHAGEAL (N/A) Subjective: Conts to have liquid stools , + Flatus  Objective: Vital signs in last 24 hours: Temp:  [98.2 F (36.8 C)-98.7 F (37.1 C)] 98.2 F (36.8 C) (09/03 0520) Pulse Rate:  [67-73] 67 (09/02 1203) Cardiac Rhythm: Sinus tachycardia (09/02 2222) Resp:  [19-23] 19 (09/03 0518) BP: (113-169)/(61-93) 115/61 (09/03 0518) SpO2:  [99 %-100 %] 99 % (09/03 0520) Weight:  [111.9 kg] 111.9 kg (09/03 0500)  Hemodynamic parameters for last 24 hours:    Intake/Output from previous day: 09/02 0701 - 09/03 0700 In: 3 [I.V.:3] Out: -  Intake/Output this shift: No intake/output data recorded.  General appearance: alert, cooperative, and no distress Heart: regular rate and rhythm Lungs: dim in bases Abdomen: distension a bit less, + BS Extremities: no edema Wound: incis healing well  Lab Results: No results for input(s): WBC, HGB, HCT, PLT in the last 72 hours. BMET:  Recent Labs    01/16/24 0455 01/17/24 0417  NA 139 139  K 3.5 3.2*  CL 103 104  CO2 26 25  GLUCOSE 104* 111*  BUN 15 14  CREATININE 0.98 1.02  CALCIUM  8.6* 8.6*    PT/INR: No results for input(s): LABPROT, INR in the last 72 hours. ABG    Component Value Date/Time   PHART 7.337 (L) 01/12/2024 1841   HCO3 20.6 01/12/2024 1841   TCO2 22 01/12/2024 1841   ACIDBASEDEF 5.0 (H) 01/12/2024 1841   O2SAT 94 01/12/2024 1841   CBG (last 3)  Recent Labs    01/16/24 2103 01/17/24 0619 01/17/24 1201  GLUCAP 167* 110* 101*    Meds Scheduled Meds:  amLODipine   10 mg Oral Daily   aspirin  EC  81 mg Oral Daily   Or   aspirin   81 mg Oral Daily   atorvastatin   80 mg Oral Daily   bisacodyl   10 mg Oral Daily   Or   bisacodyl   10 mg Rectal Daily   clopidogrel   75 mg Oral Daily    docusate sodium   200 mg Oral Daily   enoxaparin  (LOVENOX ) injection  30 mg Subcutaneous QHS   furosemide   40 mg Oral Daily   metoprolol  tartrate  25 mg Oral BID   pantoprazole   40 mg Oral Daily   sodium chloride  flush  3 mL Intravenous Q12H   Continuous Infusions:  amiodarone  30 mg/hr (01/17/24 2055)   amiodarone      PRN Meds:.alum & mag hydroxide-simeth, artificial tears, lactulose , metoprolol  tartrate, ondansetron  (ZOFRAN ) IV, mouth rinse, oxyCODONE , sodium chloride  flush, traMADol , zolpidem   Xrays DG ABD ACUTE 2+V W 1V CHEST Result Date: 01/17/2024 CLINICAL DATA:  Ileus. EXAM: DG ABDOMEN ACUTE WITH 1 VIEW CHEST COMPARISON:  January 16, 2024. FINDINGS: Stable cardiomediastinal silhouette. Sternotomy wires are noted. Minimal bilateral subsegmental atelectasis is noted. Mild to moderate diffuse colonic dilatation is noted concerning for ileus or distal obstruction. Mild amount of stool is noted in the cecum. No definite small bowel dilatation is noted. IMPRESSION: Mild to moderate diffuse colonic dilatation is noted concerning for ileus or distal obstruction. Electronically Signed   By: Lynwood Landy Raddle M.D.   On: 01/17/2024 14:12    Assessment/Plan: S/P Procedure(s) (LRB): CORONARY ARTERY BYPASS GRAFTING (CABG) TIMES FIVE USING LEFT INTERNAL MAMMARY ARTERY AND ENDOSCOPICALLY HARVESTED RIGHT GREATER SAPHENOUS VEINS. (N/A) ECHOCARDIOGRAM, TRANSESOPHAGEAL (N/A) POD#6  1 afeb, VSS, BP control  much better w/ norvasc  resumed, some episodes of SVT- cont IV amio while NPO 2 O2 sats good on RA 3 + BM Abd  Xray this am  shows increased colonic distension- may need CT scan- cont NPO 4 will get repeat labs today 5 voiding, not measured, weight about 1 kg > preop, will stop lasix  today 6 needs to walk more  7 pulm hygiene 8 lovenox  for DVT ppx   LOS: 6 days    Jeffrey FORBES Cera PA-C Pager [phone] 01/18/2024   Benign abdomen, but film concerning CT scan ordered, and gen surg  consulted  Jeffrey [name]

## 2024-01-18 NOTE — Progress Notes (Signed)
 CARDIAC REHAB PHASE I   PRE:  Rate/Rhythm: 68 SR    BP: sitting 132/78    SpO2: 94 RA  MODE:  Ambulation: 1420 ft   POST:  Rate/Rhythm: 85 SR    BP: sitting to BR     SpO2: 94 RA   Pt tolerated well. Able to get out of bed with min assist and walk with standby assist. Rest x2 for SOB while talking. Overall tolerated very well. To BR after walk. He can walk on his own which we discussed. Gave Move in the Tube guidelines as his OHS book is at home. Will f/u.   [phone]  Aliene Aris BS, ACSM-CEP 01/18/2024 11:26 AM

## 2024-01-18 NOTE — Consult Note (Signed)
 Reason for Consult/Chief Complaint: colonic dilation Consultant: Shyrl, MD  Jeffrey [name] is an 68 y.o. male.   HPI: 10M s/p CABGx5 8/28 with concern for colonic dilation on abdominal XR and question of bowel obstruction vs ileus. Patient underwent CT with oral contrast earlier today and since CT has had a bowel movement. He denies any nausea or vomiting and is hungry.   Past Medical History:  Diagnosis Date   Chest wall abscess 2012   lanced it & it's gone   Coronary artery disease    Elevated cholesterol    GERD (gastroesophageal reflux disease)     Past Surgical History:  Procedure Laterality Date   CARDIAC CATHETERIZATION N/A 02/26/2016   Procedure: Left Heart Cath and Coronary Angiography;  Surgeon: Victory LELON Sharps, MD;  Location: [facility] INVASIVE CV LAB;  Service: Cardiovascular;  Laterality: N/A;   CARDIAC CATHETERIZATION N/A 02/26/2016   Procedure: Coronary Stent Intervention;  Surgeon: Victory LELON Sharps, MD;  Location: [facility] INVASIVE CV LAB;  Service: Cardiovascular;  Laterality: N/A;   COLONOSCOPY  2007   COLONOSCOPY WITH PROPOFOL  N/A 02/10/2016   Procedure: COLONOSCOPY WITH PROPOFOL ;  Surgeon: Gladis MARLA Louder, MD;  Location: [facility] ENDOSCOPY;  Service: Endoscopy;  Laterality: N/A;   CORONARY ANGIOPLASTY WITH STENT PLACEMENT  02/26/2016   CORONARY ARTERY BYPASS GRAFT N/A 01/12/2024   Procedure: CORONARY ARTERY BYPASS GRAFTING (CABG) TIMES FIVE USING LEFT INTERNAL MAMMARY ARTERY AND ENDOSCOPICALLY HARVESTED RIGHT GREATER SAPHENOUS VEINS.;  Surgeon: Shyrl Linnie KIDD, MD;  Location: [facility] OR;  Service: Open Heart Surgery;  Laterality: N/A;   LEFT HEART CATH AND CORONARY ANGIOGRAPHY N/A 12/15/2023   Procedure: LEFT HEART CATH AND CORONARY ANGIOGRAPHY;  Surgeon: Verlin Lonni BIRCH, MD;  Location: [facility] INVASIVE CV LAB;  Service: Cardiovascular;  Laterality: N/A;   TEE WITHOUT CARDIOVERSION N/A 01/12/2024   Procedure: ECHOCARDIOGRAM, TRANSESOPHAGEAL;  Surgeon: Shyrl Linnie KIDD, MD;   Location: [facility] OR;  Service: Open Heart Surgery;  Laterality: N/A;    Family History  Problem Relation Age of Onset   Hypertension Sister    Stroke Brother     Social History:  reports that he quit smoking about 8 years ago. His smoking use included cigars. He has never used smokeless tobacco. He reports current alcohol  use of about 18.0 standard drinks of alcohol  per week. He reports that he does not use drugs.  Allergies:  Allergies  Allergen Reactions   Brilinta  [Ticagrelor ] Shortness Of Breath    Medications: I have reviewed the patient's current medications.  Results for orders placed or performed during the hospital encounter of 01/12/24 (from the past 48 hours)  Glucose, capillary     Status: Abnormal   Collection Time: 01/16/24  9:03 PM  Result Value Ref Range   Glucose-Capillary 167 (H) 70 - 99 mg/dL    Comment: Glucose reference range applies only to samples taken after fasting for at least 8 hours.  Magnesium      Status: None   Collection Time: 01/17/24  4:17 AM  Result Value Ref Range   Magnesium  2.0 1.7 - 2.4 mg/dL    Comment: Performed at [facility], [address], [place], [place] [address]  Basic metabolic panel     Status: Abnormal   Collection Time: 01/17/24  4:17 AM  Result Value Ref Range   Sodium 139 135 - 145 mmol/L   Potassium 3.2 (L) 3.5 - 5.1 mmol/L   Chloride 104 98 - 111 mmol/L   CO2 25 22 - 32  mmol/L   Glucose, Bld 111 (H) 70 - 99 mg/dL    Comment: Glucose reference range applies only to samples taken after fasting for at least 8 hours.   BUN 14 8 - 23 mg/dL   Creatinine, Ser 8.97 0.61 - 1.24 mg/dL   Calcium  8.6 (L) 8.9 - 10.3 mg/dL   GFR, Estimated >39 >39 mL/min    Comment: (NOTE) Calculated using the CKD-EPI Creatinine Equation (2021)    Anion gap 10 5 - 15    Comment: Performed at [facility], [address], [place], [place] [address]  Glucose, capillary     Status: Abnormal   Collection Time: 01/17/24  6:19 AM  Result  Value Ref Range   Glucose-Capillary 110 (H) 70 - 99 mg/dL    Comment: Glucose reference range applies only to samples taken after fasting for at least 8 hours.  Glucose, capillary     Status: Abnormal   Collection Time: 01/17/24 12:01 PM  Result Value Ref Range   Glucose-Capillary 101 (H) 70 - 99 mg/dL    Comment: Glucose reference range applies only to samples taken after fasting for at least 8 hours.  Basic metabolic panel with GFR     Status: Abnormal   Collection Time: 01/18/24  8:45 AM  Result Value Ref Range   Sodium 137 135 - 145 mmol/L   Potassium 3.4 (L) 3.5 - 5.1 mmol/L   Chloride 102 98 - 111 mmol/L   CO2 26 22 - 32 mmol/L   Glucose, Bld 104 (H) 70 - 99 mg/dL    Comment: Glucose reference range applies only to samples taken after fasting for at least 8 hours.   BUN 14 8 - 23 mg/dL   Creatinine, Ser 8.94 0.61 - 1.24 mg/dL   Calcium  8.5 (L) 8.9 - 10.3 mg/dL   GFR, Estimated >39 >39 mL/min    Comment: (NOTE) Calculated using the CKD-EPI Creatinine Equation (2021)    Anion gap 9 5 - 15    Comment: Performed at [facility], [address], [place], [place] [address]  Magnesium      Status: None   Collection Time: 01/18/24  8:45 AM  Result Value Ref Range   Magnesium  1.9 1.7 - 2.4 mg/dL    Comment: Performed at [facility], [address] [place], [place] [address]  CBC     Status: Abnormal   Collection Time: 01/18/24  8:45 AM  Result Value Ref Range   WBC 7.7 4.0 - 10.5 K/uL   RBC 3.01 (L) 4.22 - 5.81 MIL/uL   Hemoglobin 9.6 (L) 13.0 - 17.0 g/dL   HCT 70.7 (L) 60.9 - 47.9 %   MCV 97.0 80.0 - 100.0 fL   MCH 31.9 26.0 - 34.0 pg   MCHC 32.9 30.0 - 36.0 g/dL   RDW 85.9 88.4 - 84.4 %   Platelets 244 150 - 400 K/uL   nRBC 0.0 0.0 - 0.2 %    Comment: Performed at [facility], [address], [place], [place] [address]    DG Abd 1 View - KUB Result Date: 01/18/2024 CLINICAL DATA:  [id number].  Abdominal distention. EXAM: ABDOMEN - 1 VIEW COMPARISON:   Abdominal series yesterday 10:15 a.m. FINDINGS: 5:45 a.m. Colonic dilatation appears similar. Again the cecum is 10 cm in caliber, remainder of the colon up to 8 cm caliber. There may be some thumbprinting over the ascending colon but no more than previously. No pneumatosis or portal  venous gas are identified. There is no supine evidence for free air. No visible small bowel dilatation. Overall aeration seems unchanged. Advanced facet hypertrophy lower lumbar spine. IMPRESSION: Colonic dilatation appears similar to yesterday's study. There may be some thumbprinting over the ascending colon but no more than previously. No supine evidence for free air. Electronically Signed   By: Francis Quam M.D.   On: 01/18/2024 07:56   DG ABD ACUTE 2+V W 1V CHEST Result Date: 01/17/2024 CLINICAL DATA:  Ileus. EXAM: DG ABDOMEN ACUTE WITH 1 VIEW CHEST COMPARISON:  January 16, 2024. FINDINGS: Stable cardiomediastinal silhouette. Sternotomy wires are noted. Minimal bilateral subsegmental atelectasis is noted. Mild to moderate diffuse colonic dilatation is noted concerning for ileus or distal obstruction. Mild amount of stool is noted in the cecum. No definite small bowel dilatation is noted. IMPRESSION: Mild to moderate diffuse colonic dilatation is noted concerning for ileus or distal obstruction. Electronically Signed   By: Lynwood Landy Raddle M.D.   On: 01/17/2024 14:12    ROS 10 point review of systems is negative except as listed above in HPI.   Physical Exam Blood pressure 116/66, pulse 72, temperature 98.5 F (36.9 C), temperature source Oral, resp. rate (!) 21, height 5' 10 (1.778 m), weight 111.9 kg, SpO2 94%. Constitutional: well-developed, well-nourished HEENT: pupils equal, round, reactive to light, 2mm b/l, moist conjunctiva, external inspection of ears and nose normal, hearing intact Oropharynx: normal oropharyngeal mucosa, normal dentition Neck: trachea midline Chest: normal respiratory effort Abdomen:  soft, NT, mildly distended and tympanitic, silk sutures in place in the substernal region c/w post-CABG chest tubes Skin: warm, dry, no rashes Psych: normal memory, normal mood/affect     Assessment/Plan: Colonic distention - indication for consult was ?obstruction vs ileus, however between the time of consult placement and my exam, CT scan has been completed. With this additional information, it appears there may be a component of colitis. There is no evidence of bowel obstruction and with evidence of bowel function and in the absence of n/v, I believe it is reasonable to trial a PO challenge beginning with clear liquids. Care d/w primary attending and preference is for diet orders to be placed by the primary team. With normal WBC, I think it is reasonable to continue supportive care, however if clinical decline or leukocytosis, it would be reasonable to obtain a GI panel. General surgery will continue to follow.    Dreama GEANNIE Hanger, MD General and Trauma Surgery [facility]

## 2024-01-19 ENCOUNTER — Telehealth (HOSPITAL_COMMUNITY): Payer: Self-pay | Admitting: Pharmacy Technician

## 2024-01-19 ENCOUNTER — Other Ambulatory Visit (HOSPITAL_COMMUNITY): Payer: Self-pay

## 2024-01-19 LAB — BASIC METABOLIC PANEL WITH GFR
Anion gap: 11 (ref 5–15)
BUN: 9 mg/dL (ref 8–23)
CO2: 24 mmol/L (ref 22–32)
Calcium: 8.4 mg/dL — ABNORMAL LOW (ref 8.9–10.3)
Chloride: 101 mmol/L (ref 98–111)
Creatinine, Ser: 1.07 mg/dL (ref 0.61–1.24)
GFR, Estimated: >60 mL/min (ref >60)
Glucose, Bld: 101 mg/dL — ABNORMAL HIGH (ref 70–99)
Potassium: 2.6 mmol/L — CL (ref 3.5–5.1)
Sodium: 136 mmol/L (ref 135–145)

## 2024-01-19 LAB — CBC
HCT: 27.8 % — ABNORMAL LOW (ref 39.0–52.0)
Hemoglobin: 9.3 g/dL — ABNORMAL LOW (ref 13.0–17.0)
MCH: 32.3 pg (ref 26.0–34.0)
MCHC: 33.5 g/dL (ref 30.0–36.0)
MCV: 96.5 fL (ref 80.0–100.0)
Platelets: 274 K/uL (ref 150–400)
RBC: 2.88 MIL/uL — ABNORMAL LOW (ref 4.22–5.81)
RDW: 13.8 % (ref 11.5–15.5)
WBC: 8.4 K/uL (ref 4.0–10.5)
nRBC: 0 % (ref 0.0–0.2)

## 2024-01-19 MED ORDER — POTASSIUM CHLORIDE CRYS ER 20 MEQ PO TBCR
40.0000 meq | EXTENDED_RELEASE_TABLET | Freq: Three times a day (TID) | ORAL | Status: AC
  Administered 2024-01-19 (×3): 40 meq via ORAL
  Filled 2024-01-19 (×3): qty 2

## 2024-01-19 MED ORDER — AMIODARONE HCL IN DEXTROSE 360-4.14 MG/200ML-% IV SOLN
INTRAVENOUS | Status: AC
  Administered 2024-01-19: 30 mg/h
  Filled 2024-01-19: qty 200

## 2024-01-19 MED ORDER — AMIODARONE HCL 200 MG PO TABS
200.0000 mg | ORAL_TABLET | Freq: Two times a day (BID) | ORAL | Status: DC
  Administered 2024-01-19 – 2024-01-21 (×5): 200 mg via ORAL
  Filled 2024-01-19 (×5): qty 1

## 2024-01-19 MED ORDER — APIXABAN 5 MG PO TABS
5.0000 mg | ORAL_TABLET | Freq: Two times a day (BID) | ORAL | Status: DC
  Administered 2024-01-19 – 2024-01-21 (×5): 5 mg via ORAL
  Filled 2024-01-19 (×5): qty 1

## 2024-01-19 MED ORDER — POTASSIUM CHLORIDE CRYS ER 20 MEQ PO TBCR
40.0000 meq | EXTENDED_RELEASE_TABLET | Freq: Once | ORAL | Status: AC
  Administered 2024-01-19: 40 meq via ORAL
  Filled 2024-01-19: qty 2

## 2024-01-19 NOTE — Progress Notes (Signed)
 CARDIAC REHAB PHASE I   PRE:  Rate/Rhythm: 70 NSR  BP:  Sitting: 133/65      SpO2: 95 RA  MODE:  Ambulation: 230 ft    POST:  Rate/Rhythm: 70 NSR  BP:  Sitting: 95/65      SpO2: 95 RA  Pt stood independently and ambulated in the hallway with standby assistance. Pt returned to room early d/t alarming IV pump and medicine administration. Pt will walk later.  Pt received OHS book and education on restrictions, heart healthy diet, ex guidelines, Move in the Tube sheet, incentive spirometer use when d/c and CRPII. Pt denies questions and was encouraged to look in the book for additional information. Referral placed to [facility].   Garen FORBES Candy  MS, ACSM-CEP 11:18 AM 01/19/2024    Service time is from 1032 to 1118.

## 2024-01-19 NOTE — Progress Notes (Signed)
 Called to patient's room c/o pain right wrist IV with amio. Right hand/wrist swollen. Cut bracelets off. Removed IV.   Notified pharmacy of amio infiltration. Elevated site and placed warm compressed on it. Patient expressed relief once bracelets were removed.   Will continue to monitor

## 2024-01-19 NOTE — Progress Notes (Signed)
 [facility] Progress Note  7 Days Post-Op  Subjective: CC:  No acute events overnight. Denies abdominal pain. Reports frequent, brown, watery stools. Denies hematochezia or melena. Tolerating clears (water /ice pops). Mobilizing.   Objective: Vital signs in last 24 hours: Temp:  [97.6 F (36.4 C)-99 F (37.2 C)] 98 F (36.7 C) (09/04 0822) Pulse Rate:  [64-80] 75 (09/04 0822) Resp:  [18-25] 20 (09/04 0822) BP: (109-136)/(48-70) 136/70 (09/04 0822) SpO2:  [90 %-97 %] 94 % (09/04 0332) Weight:  [111.5 kg] 111.5 kg (09/04 0332) Last BM Date : 01/19/24  Intake/Output from previous day: 09/03 0701 - 09/04 0700 In: 778.9 [I.V.:778.9] Out: -  Intake/Output this shift: No intake/output data recorded.  PE: Gen:  Alert, NAD, pleasant Card:  RRR during my exam, HR 60's Pulm:  Normal effort ORA Abd: Soft, protuberant with mild to moderate distention, non-tender, no guarding  Skin: warm and dry, no rashes  Psych: A&Ox3   Lab Results:  Recent Labs    01/18/24 0845 01/19/24 0332  WBC 7.7 8.4  HGB 9.6* 9.3*  HCT 29.2* 27.8*  PLT 244 274   BMET Recent Labs    01/18/24 0845 01/19/24 0332  NA 137 136  K 3.4* 2.6*  CL 102 101  CO2 26 24  GLUCOSE 104* 101*  BUN 14 9  CREATININE 1.05 1.07  CALCIUM  8.5* 8.4*   PT/INR No results for input(s): LABPROT, INR in the last 72 hours. CMP     Component Value Date/Time   NA 136 01/19/2024 0332   NA 141 12/09/2023 0942   K 2.6 (LL) 01/19/2024 0332   CL 101 01/19/2024 0332   CO2 24 01/19/2024 0332   GLUCOSE 101 (H) 01/19/2024 0332   BUN 9 01/19/2024 0332   BUN 14 12/09/2023 0942   CREATININE 1.07 01/19/2024 0332   CREATININE 1.25 04/29/2016 0823   CALCIUM  8.4 (L) 01/19/2024 0332   PROT 7.2 01/11/2024 1055   PROT 6.9 10/25/2018 0833   ALBUMIN  3.8 01/11/2024 1055   ALBUMIN  4.5 10/25/2018 0833   AST 35 01/11/2024 1055   ALT 33 01/11/2024 1055   ALKPHOS 57 01/11/2024 1055   BILITOT 0.9 01/11/2024 1055    BILITOT 0.7 10/25/2018 0833   GFRNONAA >60 01/19/2024 0332   GFRAA 88 10/25/2018 0833   Lipase  No results found for: LIPASE     Studies/Results: CT CHEST ABDOMEN PELVIS W CONTRAST Result Date: 01/18/2024 CLINICAL DATA:  Recent CABG. Small bowel carcinoma. Concern for bowel obstruction. * Tracking Code: BO * EXAM: CT CHEST, ABDOMEN, AND PELVIS WITH CONTRAST TECHNIQUE: Multidetector CT imaging of the chest, abdomen and pelvis was performed following the standard protocol during bolus administration of intravenous contrast. RADIATION DOSE REDUCTION: This exam was performed according to the departmental dose-optimization program which includes automated exposure control, adjustment of the mA and/or kV according to patient size and/or use of iterative reconstruction technique. CONTRAST:  75mL OMNIPAQUE  IOHEXOL  350 MG/ML SOLN COMPARISON:  None Available. FINDINGS: CT CHEST FINDINGS Cardiovascular: Post CABG anatomy. Small amount fluid deep to the sternum related to recent sternotomy. No pericardial fluid. No vascular complication present. Mediastinum/Nodes: Trachea and esophagus are normal. Lungs/Pleura: There is bilateral pleural effusion and mild bibasilar atelectasis. No evidence of pneumonia. No pneumothorax. Musculoskeletal: No thoracic skeletal metastasis. CT ABDOMEN AND PELVIS FINDINGS Hepatobiliary: No focal hepatic lesion. No biliary ductal dilatation. Gallbladder is normal. Common bile duct is normal. Pancreas: Pancreas is normal. No ductal dilatation. No pancreatic inflammation. Spleen: Normal spleen Adrenals/urinary tract:  Adrenal glands and kidneys are normal. The ureters and bladder normal. Stomach/Bowel: Stomach is decompressed. Duodenum is normal. No dilated loops of small bowel. The small bowel drowned the decompressed. Terminal ileum is normal. Appendix normal. Contrast flows into the cecum. There is fluid fluid level in the cecum (images 60/series 8. Suggest mixing of contrast and fluid  stool. There is mild inflammation at the hepatic flexure adjacent pericolonic fat (is 163/6. No evidence of bowel obstruction. Distal LEFT colon sigmoid colon normal. Contrast flows to the rectum. Vascular/Lymphatic: Abdominal aorta is normal caliber with atherosclerotic calcification. There is no retroperitoneal or periportal lymphadenopathy. No pelvic lymphadenopathy. Reproductive: Prostate unremarkable Other: No free fluid. Musculoskeletal: No aggressive osseous lesion. IMPRESSION: CHEST: 1. Post CABG anatomy. No vascular complication. 2. Small bilateral pleural effusions and bibasilar atelectasis. PELVIS: 1. No evidence of bowel obstruction. 2. Mild inflammation at the hepatic flexure of the colon. Potential segmental colitis. 3. No evidence of metastatic disease in the abdomen pelvis. 4.  Aortic Atherosclerosis (ICD10-I70.0). Electronically Signed   By: Jackquline Boxer M.D.   On: 01/18/2024 18:28   DG Abd 1 View - KUB Result Date: 01/18/2024 CLINICAL DATA:  [id number].  Abdominal distention. EXAM: ABDOMEN - 1 VIEW COMPARISON:  Abdominal series yesterday 10:15 a.m. FINDINGS: 5:45 a.m. Colonic dilatation appears similar. Again the cecum is 10 cm in caliber, remainder of the colon up to 8 cm caliber. There may be some thumbprinting over the ascending colon but no more than previously. No pneumatosis or portal venous gas are identified. There is no supine evidence for free air. No visible small bowel dilatation. Overall aeration seems unchanged. Advanced facet hypertrophy lower lumbar spine. IMPRESSION: Colonic dilatation appears similar to yesterday's study. There may be some thumbprinting over the ascending colon but no more than previously. No supine evidence for free air. Electronically Signed   By: Francis Quam M.D.   On: 01/18/2024 07:56   DG ABD ACUTE 2+V W 1V CHEST Result Date: 01/17/2024 CLINICAL DATA:  Ileus. EXAM: DG ABDOMEN ACUTE WITH 1 VIEW CHEST COMPARISON:  January 16, 2024. FINDINGS: Stable  cardiomediastinal silhouette. Sternotomy wires are noted. Minimal bilateral subsegmental atelectasis is noted. Mild to moderate diffuse colonic dilatation is noted concerning for ileus or distal obstruction. Mild amount of stool is noted in the cecum. No definite small bowel dilatation is noted. IMPRESSION: Mild to moderate diffuse colonic dilatation is noted concerning for ileus or distal obstruction. Electronically Signed   By: Lynwood Landy Raddle M.D.   On: 01/17/2024 14:12    Anti-infectives: Anti-infectives (From admission, onward)    Start     Dose/Rate Route Frequency Ordered Stop   01/12/24 2030  vancomycin  (VANCOCIN ) IVPB 1000 mg/200 mL premix        1,000 mg 200 mL/hr over 60 Minutes Intravenous  Once 01/12/24 1419 01/12/24 2108   01/12/24 1430  ceFAZolin  (ANCEF ) IVPB 2g/100 mL premix        2 g 200 mL/hr over 30 Minutes Intravenous Every 8 hours 01/12/24 1419 01/14/24 0637   01/12/24 0400  Vancomycin  (VANCOCIN ) 1,500 mg in sodium chloride  0.9 % 500 mL IVPB        1,500 mg 250 mL/hr over 120 Minutes Intravenous To Surgery 01/11/24 1417 01/12/24 1013   01/12/24 0400  ceFAZolin  (ANCEF ) IVPB 2g/100 mL premix        2 g 200 mL/hr over 30 Minutes Intravenous To Surgery 01/11/24 1417 01/12/24 0845   01/12/24 0400  ceFAZolin  (ANCEF ) IVPB 2g/100 mL premix  2 g 200 mL/hr over 30 Minutes Intravenous To Surgery 01/11/24 1417 01/12/24 1355        Assessment/Plan 68 y/o M s/p CABG 01/12/24 who developed abdominal distention, CT w/ colonic distention and colitis at the hepatic flexure  -  afebrile, WBC WNL, hgb stable at 9.3 - in exam he is non-tender with improvement in distention. - ok to advance to FLD and monitor  - continue to recommend supportive care and no antibiotics  - hypokalemia (2.6) in the setting of diarrhea. Primary team has ordered 40 mEq KCl TID. Recommend goal of K > 4.0 and Mg > 2.0 to encourage normal bowel motility.    LOS: 7 days   I reviewed nursing notes,  hospitalist notes, last 24 h vitals and pain scores, last 48 h intake and output, last 24 h labs and trends, and last 24 h imaging results.  This care required straight-forward level of medical decision making.   Almarie Pringle, PA-C [facility] Please see Amion for pager number during day hours 7:00am-4:30pm

## 2024-01-19 NOTE — Progress Notes (Signed)
 Mobility Specialist Progress Note:    01/19/24 1148  Mobility  Activity Ambulated with assistance  Level of Assistance Standby assist, set-up cues, supervision of patient - no hands on  Assistive Device None  Distance Ambulated (ft) 1000 ft  RUE Weight Bearing Per Provider Order NWB  LUE Weight Bearing Per Provider Order NWB  Activity Response Tolerated well  Mobility Referral Yes  Mobility visit 1 Mobility  Mobility Specialist Start Time (ACUTE ONLY) 1130  Mobility Specialist Stop Time (ACUTE ONLY) 1148  Mobility Specialist Time Calculation (min) (ACUTE ONLY) 18 min   Pt received in room, sister at bedside, agreeable to mobility session. Ambulated independently in hallway after set up. Tolerated well, asx throughout. Max HR 120 bpm. Lying comfortably in bed with all needs met. RN notified.    Chivon Lepage Mobility Specialist Please contact via Special educational needs teacher or  Rehab office at [phone]

## 2024-01-19 NOTE — Telephone Encounter (Signed)
 Patient Product/process development scientist completed.    The patient is insured through [employer] [facility]. Patient has [employer], may use a copay card, and/or apply for patient assistance if available.    Ran test claim for Eliquis  5 mg and the current 30 day co-pay is $28.94.   This test claim was processed through  [facility]- copay amounts may vary at other pharmacies due to pharmacy/plan contracts, or as the patient moves through the different stages of their insurance plan.     Reyes Sharps, CPHT Pharmacy Technician III Certified Patient Advocate [facility] Patient Advocate Team Direct Number: [phone]  Fax: [phone]

## 2024-01-19 NOTE — Progress Notes (Signed)
 Pt assisted to ambulate in hall approx 700 ft, with no assist device.  During walk, Pt went into Afib, HR 110's to 130's.  Back to bed after walk with family in room.  Will monitor closely and give PRN metoprolol  as indicated.

## 2024-01-19 NOTE — Plan of Care (Signed)
   Problem: Clinical Measurements: Goal: Will remain free from infection Outcome: Progressing Goal: Diagnostic test results will improve Outcome: Progressing

## 2024-01-19 NOTE — Progress Notes (Addendum)
 7 Days Post-Op Procedure(s) (LRB): CORONARY ARTERY BYPASS GRAFTING (CABG) TIMES FIVE USING LEFT INTERNAL MAMMARY ARTERY AND ENDOSCOPICALLY HARVESTED RIGHT GREATER SAPHENOUS VEINS. (N/A) ECHOCARDIOGRAM, TRANSESOPHAGEAL (N/A) Subjective: Conts to have loose stools, feels a lot better  Objective: Vital signs in last 24 hours: Temp:  [97.6 F (36.4 C)-99 F (37.2 C)] 99 F (37.2 C) (09/04 0332) Pulse Rate:  [64-80] 67 (09/04 0332) Cardiac Rhythm: Normal sinus rhythm (09/03 2000) Resp:  [18-25] 20 (09/04 0332) BP: (109-130)/(48-69) 125/60 (09/04 0332) SpO2:  [90 %-100 %] 94 % (09/04 0332) Weight:  [111.5 kg] 111.5 kg (09/04 0332)  Hemodynamic parameters for last 24 hours:    Intake/Output from previous day: 09/03 0701 - 09/04 0700 In: 778.9 [I.V.:778.9] Out: -  Intake/Output this shift: No intake/output data recorded.  General appearance: alert, cooperative, and no distress Heart: regular rate and rhythm Lungs: dim in bases Abdomen: distension is much improved, remains non- tender, + BS Extremities: trace edema Wound: incis healing well  Lab Results: Recent Labs    01/18/24 0845 01/19/24 0332  WBC 7.7 8.4  HGB 9.6* 9.3*  HCT 29.2* 27.8*  PLT 244 274   BMET:  Recent Labs    01/18/24 0845 01/19/24 0332  NA 137 136  K 3.4* 2.6*  CL 102 101  CO2 26 24  GLUCOSE 104* 101*  BUN 14 9  CREATININE 1.05 1.07  CALCIUM  8.5* 8.4*    PT/INR: No results for input(s): LABPROT, INR in the last 72 hours. ABG    Component Value Date/Time   PHART 7.337 (L) 01/12/2024 1841   HCO3 20.6 01/12/2024 1841   TCO2 22 01/12/2024 1841   ACIDBASEDEF 5.0 (H) 01/12/2024 1841   O2SAT 94 01/12/2024 1841   CBG (last 3)  Recent Labs    01/16/24 2103 01/17/24 0619 01/17/24 1201  GLUCAP 167* 110* 101*    Meds Scheduled Meds:  amLODipine   10 mg Oral Daily   aspirin  EC  81 mg Oral Daily   Or   aspirin   81 mg Oral Daily   atorvastatin   80 mg Oral Daily   bisacodyl   10 mg  Oral Daily   Or   bisacodyl   10 mg Rectal Daily   clopidogrel   75 mg Oral Daily   docusate sodium   200 mg Oral Daily   enoxaparin  (LOVENOX ) injection  30 mg Subcutaneous QHS   magnesium  oxide  400 mg Oral Daily   metoprolol  tartrate  25 mg Oral BID   pantoprazole   40 mg Oral Daily   potassium chloride   40 mEq Oral TID   sodium chloride  flush  3 mL Intravenous Q12H   Continuous Infusions:  amiodarone  30 mg/hr (01/18/24 2207)   amiodarone      PRN Meds:.alum & mag hydroxide-simeth, artificial tears, lactulose , metoprolol  tartrate, ondansetron  (ZOFRAN ) IV, mouth rinse, oxyCODONE , sodium chloride  flush, traMADol , zolpidem   Xrays CT CHEST ABDOMEN PELVIS W CONTRAST Result Date: 01/18/2024 CLINICAL DATA:  Recent CABG. Small bowel carcinoma. Concern for bowel obstruction. * Tracking Code: BO * EXAM: CT CHEST, ABDOMEN, AND PELVIS WITH CONTRAST TECHNIQUE: Multidetector CT imaging of the chest, abdomen and pelvis was performed following the standard protocol during bolus administration of intravenous contrast. RADIATION DOSE REDUCTION: This exam was performed according to the departmental dose-optimization program which includes automated exposure control, adjustment of the mA and/or kV according to patient size and/or use of iterative reconstruction technique. CONTRAST:  75mL OMNIPAQUE  IOHEXOL  350 MG/ML SOLN COMPARISON:  None Available. FINDINGS: CT CHEST FINDINGS Cardiovascular: Post CABG  anatomy. Small amount fluid deep to the sternum related to recent sternotomy. No pericardial fluid. No vascular complication present. Mediastinum/Nodes: Trachea and esophagus are normal. Lungs/Pleura: There is bilateral pleural effusion and mild bibasilar atelectasis. No evidence of pneumonia. No pneumothorax. Musculoskeletal: No thoracic skeletal metastasis. CT ABDOMEN AND PELVIS FINDINGS Hepatobiliary: No focal hepatic lesion. No biliary ductal dilatation. Gallbladder is normal. Common bile duct is normal. Pancreas:  Pancreas is normal. No ductal dilatation. No pancreatic inflammation. Spleen: Normal spleen Adrenals/urinary tract: Adrenal glands and kidneys are normal. The ureters and bladder normal. Stomach/Bowel: Stomach is decompressed. Duodenum is normal. No dilated loops of small bowel. The small bowel drowned the decompressed. Terminal ileum is normal. Appendix normal. Contrast flows into the cecum. There is fluid fluid level in the cecum (images 60/series 8. Suggest mixing of contrast and fluid stool. There is mild inflammation at the hepatic flexure adjacent pericolonic fat (is 163/6. No evidence of bowel obstruction. Distal LEFT colon sigmoid colon normal. Contrast flows to the rectum. Vascular/Lymphatic: Abdominal aorta is normal caliber with atherosclerotic calcification. There is no retroperitoneal or periportal lymphadenopathy. No pelvic lymphadenopathy. Reproductive: Prostate unremarkable Other: No free fluid. Musculoskeletal: No aggressive osseous lesion. IMPRESSION: CHEST: 1. Post CABG anatomy. No vascular complication. 2. Small bilateral pleural effusions and bibasilar atelectasis. PELVIS: 1. No evidence of bowel obstruction. 2. Mild inflammation at the hepatic flexure of the colon. Potential segmental colitis. 3. No evidence of metastatic disease in the abdomen pelvis. 4.  Aortic Atherosclerosis (ICD10-I70.0). Electronically Signed   By: Jackquline Boxer M.D.   On: 01/18/2024 18:28   DG Abd 1 View - KUB Result Date: 01/18/2024 CLINICAL DATA:  [id number].  Abdominal distention. EXAM: ABDOMEN - 1 VIEW COMPARISON:  Abdominal series yesterday 10:15 a.m. FINDINGS: 5:45 a.m. Colonic dilatation appears similar. Again the cecum is 10 cm in caliber, remainder of the colon up to 8 cm caliber. There may be some thumbprinting over the ascending colon but no more than previously. No pneumatosis or portal venous gas are identified. There is no supine evidence for free air. No visible small bowel dilatation. Overall aeration  seems unchanged. Advanced facet hypertrophy lower lumbar spine. IMPRESSION: Colonic dilatation appears similar to yesterday's study. There may be some thumbprinting over the ascending colon but no more than previously. No supine evidence for free air. Electronically Signed   By: Francis Quam M.D.   On: 01/18/2024 07:56   DG ABD ACUTE 2+V W 1V CHEST Result Date: 01/17/2024 CLINICAL DATA:  Ileus. EXAM: DG ABDOMEN ACUTE WITH 1 VIEW CHEST COMPARISON:  January 16, 2024. FINDINGS: Stable cardiomediastinal silhouette. Sternotomy wires are noted. Minimal bilateral subsegmental atelectasis is noted. Mild to moderate diffuse colonic dilatation is noted concerning for ileus or distal obstruction. Mild amount of stool is noted in the cecum. No definite small bowel dilatation is noted. IMPRESSION: Mild to moderate diffuse colonic dilatation is noted concerning for ileus or distal obstruction. Electronically Signed   By: Lynwood Landy Raddle M.D.   On: 01/17/2024 14:12    Assessment/Plan: S/P Procedure(s) (LRB): CORONARY ARTERY BYPASS GRAFTING (CABG) TIMES FIVE USING LEFT INTERNAL MAMMARY ARTERY AND ENDOSCOPICALLY HARVESTED RIGHT GREATER SAPHENOUS VEINS. (N/A) ECHOCARDIOGRAM, TRANSESOPHAGEAL (N/A) POD#7  1 Tmax 99, VSS, sinus rhythm, has had some rate controlled afib- on amio gtt - will convert to po  2 sats good on RA 3 weight stable, UOP not recorded- off diuretics 4 K+ 2.6- replacement ordered, likely d/t diarrhea, will check cdiff if develops fevers 5 normal renal fxn 6 general  surgery has evaluated patient, CT reviewed, started on clear liquid diet, hopefully can advance fairly quickly, possible colitis component of colonic distension, no obstruction 7 no leukocytosis 8 slow decrease in H/H but relatively stable over last 3 days 9 will need to determine timing of starting ACRx for recurrent afib      LOS: 7 days    Lemond FORBES Cera PA-C Pager [phone] 01/19/2024  Restarting diet On eliquis  and  repleting K Dispo planning  Alleigh Mollica O Amarianna Abplanalp

## 2024-01-19 NOTE — Progress Notes (Addendum)
   01/19/24 1604  TOC Brief Assessment  Insurance and Status Reviewed  Patient has primary care physician Yes  Home environment has been reviewed home w/ spouse  Prior level of function: self/independent  Prior/Current Home Services No current home services  Social Drivers of Health Review SDOH reviewed no interventions necessary  Readmission risk has been reviewed Yes  Transition of care needs transition of care needs identified, TOC will continue to follow    Pt s/p CABG, post op afib. Plan for Eliquis  on discharge. PharmD advocate benefit check completed for copay- $28.94  . CM notified by [facility] liaison that TCTS office made referral for [facility] needs- Liaison following.   Inpatient Care Management (ICM) will continue to monitor patient advancement through interdisciplinary progression rounds. If new patient transition needs arise, please place a ICM (CM/CSW) consult.

## 2024-01-19 NOTE — Plan of Care (Signed)
 ?  Problem: Clinical Measurements: ?Goal: Ability to maintain clinical measurements within normal limits will improve ?Outcome: Progressing ?Goal: Will remain free from infection ?Outcome: Progressing ?Goal: Diagnostic test results will improve ?Outcome: Progressing ?  ?

## 2024-01-20 ENCOUNTER — Ambulatory Visit: Payer: Self-pay | Admitting: Thoracic Surgery (Cardiothoracic Vascular Surgery)

## 2024-01-20 LAB — BASIC METABOLIC PANEL WITH GFR
Anion gap: 10 (ref 5–15)
BUN: 8 mg/dL (ref 8–23)
CO2: 25 mmol/L (ref 22–32)
Calcium: 8.6 mg/dL — ABNORMAL LOW (ref 8.9–10.3)
Chloride: 104 mmol/L (ref 98–111)
Creatinine, Ser: 1.1 mg/dL (ref 0.61–1.24)
GFR, Estimated: >60 mL/min (ref >60)
Glucose, Bld: 101 mg/dL — ABNORMAL HIGH (ref 70–99)
Potassium: 3.5 mmol/L (ref 3.5–5.1)
Sodium: 139 mmol/L (ref 135–145)

## 2024-01-20 LAB — CBC
HCT: 27 % — ABNORMAL LOW (ref 39.0–52.0)
Hemoglobin: 9 g/dL — ABNORMAL LOW (ref 13.0–17.0)
MCH: 32 pg (ref 26.0–34.0)
MCHC: 33.3 g/dL (ref 30.0–36.0)
MCV: 96.1 fL (ref 80.0–100.0)
Platelets: 298 K/uL (ref 150–400)
RBC: 2.81 MIL/uL — ABNORMAL LOW (ref 4.22–5.81)
RDW: 13.7 % (ref 11.5–15.5)
WBC: 8.4 K/uL (ref 4.0–10.5)
nRBC: 0 % (ref 0.0–0.2)

## 2024-01-20 LAB — MAGNESIUM: Magnesium: 2.1 mg/dL (ref 1.7–2.4)

## 2024-01-20 MED ORDER — POTASSIUM CHLORIDE CRYS ER 20 MEQ PO TBCR
40.0000 meq | EXTENDED_RELEASE_TABLET | Freq: Two times a day (BID) | ORAL | Status: AC
  Administered 2024-01-20 (×2): 40 meq via ORAL
  Filled 2024-01-20: qty 2
  Filled 2024-01-20: qty 4

## 2024-01-20 NOTE — Progress Notes (Signed)
 Mobility Specialist Progress Note:    01/20/24 1101  Mobility  Activity Ambulated independently  Level of Assistance Independent after set-up  Assistive Device None  Distance Ambulated (ft) 1000 ft  RUE Weight Bearing Per Provider Order NWB  LUE Weight Bearing Per Provider Order NWB  Activity Response Tolerated well  Mobility Referral Yes  Mobility visit 1 Mobility  Mobility Specialist Start Time (ACUTE ONLY) 1101  Mobility Specialist Stop Time (ACUTE ONLY) 1120  Mobility Specialist Time Calculation (min) (ACUTE ONLY) 19 min   Pt received in bed, agreeable to mobility session. Ambulated in hallway independently. HR in 80s throughout. Audible SOB, RR in 30's. Tolerated well, asx throughout. Returned pt to room, eager for d/c. All needs met.   Morayo Leven Mobility Specialist Please contact via Special educational needs teacher or  Rehab office at [phone]

## 2024-01-20 NOTE — Progress Notes (Signed)
 [facility] Progress Note  8 Days Post-Op  Subjective: Jeffrey [name]. Multiple bowel movements--diarrhea. Tolerating heart healthy diet this AM without issues  Objective: Vital signs in last 24 hours: Temp:  [97.8 F (36.6 C)-98.9 F (37.2 C)] 98.2 F (36.8 C) (09/05 0842) Pulse Rate:  [64-75] 70 (09/05 0842) Resp:  [18-20] 19 (09/05 0842) BP: (110-135)/(55-88) 130/55 (09/05 0842) SpO2:  [95 %-97 %] 97 % (09/05 0842) Weight:  [110.5 kg] 110.5 kg (09/05 0647) Last BM Date : 01/19/24  Intake/Output from previous day: No intake/output data recorded. Intake/Output this shift: No intake/output data recorded.  PE: Gen:  Alert, NAD, pleasant Card:  RRR during my exam, HR 60's Pulm:  Normal effort ORA Abd: Soft, protuberant, mild distension, improved Skin: warm and dry, no rashes  Psych: A&Ox3   Lab Results:  Recent Labs    01/19/24 0332 01/20/24 0313  WBC 8.4 8.4  HGB 9.3* 9.0*  HCT 27.8* 27.0*  PLT 274 298   BMET Recent Labs    01/19/24 0332 01/20/24 0313  NA 136 139  K 2.6* 3.5  CL 101 104  CO2 24 25  GLUCOSE 101* 101*  BUN 9 8  CREATININE 1.07 1.10  CALCIUM  8.4* 8.6*   PT/INR No results for input(s): LABPROT, INR in the last 72 hours. CMP     Component Value Date/Time   NA 139 01/20/2024 0313   NA 141 12/09/2023 0942   K 3.5 01/20/2024 0313   CL 104 01/20/2024 0313   CO2 25 01/20/2024 0313   GLUCOSE 101 (H) 01/20/2024 0313   BUN 8 01/20/2024 0313   BUN 14 12/09/2023 0942   CREATININE 1.10 01/20/2024 0313   CREATININE 1.25 04/29/2016 0823   CALCIUM  8.6 (L) 01/20/2024 0313   PROT 7.2 01/11/2024 1055   PROT 6.9 10/25/2018 0833   ALBUMIN  3.8 01/11/2024 1055   ALBUMIN  4.5 10/25/2018 0833   AST 35 01/11/2024 1055   ALT 33 01/11/2024 1055   ALKPHOS 57 01/11/2024 1055   BILITOT 0.9 01/11/2024 1055   BILITOT 0.7 10/25/2018 0833   GFRNONAA >60 01/20/2024 0313   GFRAA 88 10/25/2018 0833   Lipase  No results found for:  LIPASE     Studies/Results: CT CHEST ABDOMEN PELVIS W CONTRAST Result Date: 01/18/2024 CLINICAL DATA:  Recent CABG. Small bowel carcinoma. Concern for bowel obstruction. * Tracking Code: BO * EXAM: CT CHEST, ABDOMEN, AND PELVIS WITH CONTRAST TECHNIQUE: Multidetector CT imaging of the chest, abdomen and pelvis was performed following the standard protocol during bolus administration of intravenous contrast. RADIATION DOSE REDUCTION: This exam was performed according to the departmental dose-optimization program which includes automated exposure control, adjustment of the mA and/or kV according to patient size and/or use of iterative reconstruction technique. CONTRAST:  75mL OMNIPAQUE  IOHEXOL  350 MG/ML SOLN COMPARISON:  None Available. FINDINGS: CT CHEST FINDINGS Cardiovascular: Post CABG anatomy. Small amount fluid deep to the sternum related to recent sternotomy. No pericardial fluid. No vascular complication present. Mediastinum/Nodes: Trachea and esophagus are normal. Lungs/Pleura: There is bilateral pleural effusion and mild bibasilar atelectasis. No evidence of pneumonia. No pneumothorax. Musculoskeletal: No thoracic skeletal metastasis. CT ABDOMEN AND PELVIS FINDINGS Hepatobiliary: No focal hepatic lesion. No biliary ductal dilatation. Gallbladder is normal. Common bile duct is normal. Pancreas: Pancreas is normal. No ductal dilatation. No pancreatic inflammation. Spleen: Normal spleen Adrenals/urinary tract: Adrenal glands and kidneys are normal. The ureters and bladder normal. Stomach/Bowel: Stomach is decompressed. Duodenum is normal. No dilated loops of small bowel. The small  bowel drowned the decompressed. Terminal ileum is normal. Appendix normal. Contrast flows into the cecum. There is fluid fluid level in the cecum (images 60/series 8. Suggest mixing of contrast and fluid stool. There is mild inflammation at the hepatic flexure adjacent pericolonic fat (is 163/6. No evidence of bowel  obstruction. Distal LEFT colon sigmoid colon normal. Contrast flows to the rectum. Vascular/Lymphatic: Abdominal aorta is normal caliber with atherosclerotic calcification. There is no retroperitoneal or periportal lymphadenopathy. No pelvic lymphadenopathy. Reproductive: Prostate unremarkable Other: No free fluid. Musculoskeletal: No aggressive osseous lesion. IMPRESSION: CHEST: 1. Post CABG anatomy. No vascular complication. 2. Small bilateral pleural effusions and bibasilar atelectasis. PELVIS: 1. No evidence of bowel obstruction. 2. Mild inflammation at the hepatic flexure of the colon. Potential segmental colitis. 3. No evidence of metastatic disease in the abdomen pelvis. 4.  Aortic Atherosclerosis (ICD10-I70.0). Electronically Signed   By: Jackquline Boxer M.D.   On: 01/18/2024 18:28    Anti-infectives: Anti-infectives (From admission, onward)    Start     Dose/Rate Route Frequency Ordered Stop   01/12/24 2030  vancomycin  (VANCOCIN ) IVPB 1000 mg/200 mL premix        1,000 mg 200 mL/hr over 60 Minutes Intravenous  Once 01/12/24 1419 01/12/24 2108   01/12/24 1430  ceFAZolin  (ANCEF ) IVPB 2g/100 mL premix        2 g 200 mL/hr over 30 Minutes Intravenous Every 8 hours 01/12/24 1419 01/14/24 0637   01/12/24 0400  Vancomycin  (VANCOCIN ) 1,500 mg in sodium chloride  0.9 % 500 mL IVPB        1,500 mg 250 mL/hr over 120 Minutes Intravenous To Surgery 01/11/24 1417 01/12/24 1013   01/12/24 0400  ceFAZolin  (ANCEF ) IVPB 2g/100 mL premix        2 g 200 mL/hr over 30 Minutes Intravenous To Surgery 01/11/24 1417 01/12/24 0845   01/12/24 0400  ceFAZolin  (ANCEF ) IVPB 2g/100 mL premix        2 g 200 mL/hr over 30 Minutes Intravenous To Surgery 01/11/24 1417 01/12/24 1355        Assessment/Plan 68 y/o M s/p CABG 01/12/24 who developed abdominal distention, CT w/ colonic distention and colitis at the hepatic flexure  -  afebrile, WBC WNL, Hb stable 9.0 from 9.3 - Patient already advanced to heart  healthy diet this AM - continue to recommend supportive care and no antibiotics  - hypokalemia (3.5) in the setting of diarrhea. Primary team has ordered 40 mEq KCl BID. Recommend goal of K > 4.0 and Mg > 2.0 to encourage normal bowel motility.   Given patient is tolerating diet (eating breakfast this AM on exam), continued improving distension, nontender, there is no indication for surgical intervention.  We will sign off at this time. Please reach back out if any questions or concerns arise   LOS: 8 days   I reviewed nursing notes, hospitalist notes, last 24 h vitals and pain scores, last 48 h intake and output, last 24 h labs and trends, and last 24 h imaging results.  This care required straight-forward level of medical decision making.   Orie Silversmith, MD [facility]

## 2024-01-20 NOTE — Progress Notes (Addendum)
 8 Days Post-Op Procedure(s) (LRB): CORONARY ARTERY BYPASS GRAFTING (CABG) TIMES FIVE USING LEFT INTERNAL MAMMARY ARTERY AND ENDOSCOPICALLY HARVESTED RIGHT GREATER SAPHENOUS VEINS. (N/A) ECHOCARDIOGRAM, TRANSESOPHAGEAL (N/A) Subjective: Conts with some afib, + diarrhea  Objective: Vital signs in last 24 hours: Temp:  [97.8 F (36.6 C)-98.9 F (37.2 C)] 98.1 F (36.7 C) (09/05 0406) Pulse Rate:  [64-75] 66 (09/05 0406) Cardiac Rhythm: Normal sinus rhythm;Atrial fibrillation (09/04 2100) Resp:  [18-20] 18 (09/05 0647) BP: (110-136)/(66-88) 110/67 (09/05 0406) SpO2:  [95 %] 95 % (09/05 0406) Weight:  [110.5 kg] 110.5 kg (09/05 0647)  Hemodynamic parameters for last 24 hours:    Intake/Output from previous day: No intake/output data recorded. Intake/Output this shift: No intake/output data recorded.  General appearance: alert, cooperative, and no distress Heart: regular rate and rhythm Lungs: mildly dim in bases Abdomen: conts to be less protuberant, Hyperactive BS, soft, non tender Extremities: no edema Wound: incis healing well  Lab Results: Recent Labs    01/19/24 0332 01/20/24 0313  WBC 8.4 8.4  HGB 9.3* 9.0*  HCT 27.8* 27.0*  PLT 274 298   BMET:  Recent Labs    01/19/24 0332 01/20/24 0313  NA 136 139  K 2.6* 3.5  CL 101 104  CO2 24 25  GLUCOSE 101* 101*  BUN 9 8  CREATININE 1.07 1.10  CALCIUM  8.4* 8.6*    PT/INR: No results for input(s): LABPROT, INR in the last 72 hours. ABG    Component Value Date/Time   PHART 7.337 (L) 01/12/2024 1841   HCO3 20.6 01/12/2024 1841   TCO2 22 01/12/2024 1841   ACIDBASEDEF 5.0 (H) 01/12/2024 1841   O2SAT 94 01/12/2024 1841   CBG (last 3)  Recent Labs    01/17/24 1201  GLUCAP 101*    Meds Scheduled Meds:  amiodarone   200 mg Oral BID   amLODipine   10 mg Oral Daily   apixaban   5 mg Oral BID   aspirin  EC  81 mg Oral Daily   Or   aspirin   81 mg Oral Daily   atorvastatin   80 mg Oral Daily   bisacodyl   10  mg Oral Daily   Or   bisacodyl   10 mg Rectal Daily   docusate sodium   200 mg Oral Daily   magnesium  oxide  400 mg Oral Daily   metoprolol  tartrate  25 mg Oral BID   pantoprazole   40 mg Oral Daily   sodium chloride  flush  3 mL Intravenous Q12H   Continuous Infusions: PRN Meds:.alum & mag hydroxide-simeth, artificial tears, lactulose , metoprolol  tartrate, ondansetron  (ZOFRAN ) IV, mouth rinse, oxyCODONE , sodium chloride  flush, traMADol , zolpidem   Xrays CT CHEST ABDOMEN PELVIS W CONTRAST Result Date: 01/18/2024 CLINICAL DATA:  Recent CABG. Small bowel carcinoma. Concern for bowel obstruction. * Tracking Code: BO * EXAM: CT CHEST, ABDOMEN, AND PELVIS WITH CONTRAST TECHNIQUE: Multidetector CT imaging of the chest, abdomen and pelvis was performed following the standard protocol during bolus administration of intravenous contrast. RADIATION DOSE REDUCTION: This exam was performed according to the departmental dose-optimization program which includes automated exposure control, adjustment of the mA and/or kV according to patient size and/or use of iterative reconstruction technique. CONTRAST:  75mL OMNIPAQUE  IOHEXOL  350 MG/ML SOLN COMPARISON:  None Available. FINDINGS: CT CHEST FINDINGS Cardiovascular: Post CABG anatomy. Small amount fluid deep to the sternum related to recent sternotomy. No pericardial fluid. No vascular complication present. Mediastinum/Nodes: Trachea and esophagus are normal. Lungs/Pleura: There is bilateral pleural effusion and mild bibasilar atelectasis. No evidence of  pneumonia. No pneumothorax. Musculoskeletal: No thoracic skeletal metastasis. CT ABDOMEN AND PELVIS FINDINGS Hepatobiliary: No focal hepatic lesion. No biliary ductal dilatation. Gallbladder is normal. Common bile duct is normal. Pancreas: Pancreas is normal. No ductal dilatation. No pancreatic inflammation. Spleen: Normal spleen Adrenals/urinary tract: Adrenal glands and kidneys are normal. The ureters and bladder normal.  Stomach/Bowel: Stomach is decompressed. Duodenum is normal. No dilated loops of small bowel. The small bowel drowned the decompressed. Terminal ileum is normal. Appendix normal. Contrast flows into the cecum. There is fluid fluid level in the cecum (images 60/series 8. Suggest mixing of contrast and fluid stool. There is mild inflammation at the hepatic flexure adjacent pericolonic fat (is 163/6. No evidence of bowel obstruction. Distal LEFT colon sigmoid colon normal. Contrast flows to the rectum. Vascular/Lymphatic: Abdominal aorta is normal caliber with atherosclerotic calcification. There is no retroperitoneal or periportal lymphadenopathy. No pelvic lymphadenopathy. Reproductive: Prostate unremarkable Other: No free fluid. Musculoskeletal: No aggressive osseous lesion. IMPRESSION: CHEST: 1. Post CABG anatomy. No vascular complication. 2. Small bilateral pleural effusions and bibasilar atelectasis. PELVIS: 1. No evidence of bowel obstruction. 2. Mild inflammation at the hepatic flexure of the colon. Potential segmental colitis. 3. No evidence of metastatic disease in the abdomen pelvis. 4.  Aortic Atherosclerosis (ICD10-I70.0). Electronically Signed   By: Jackquline Boxer M.D.   On: 01/18/2024 18:28    Assessment/Plan: S/P Procedure(s) (LRB): CORONARY ARTERY BYPASS GRAFTING (CABG) TIMES FIVE USING LEFT INTERNAL MAMMARY ARTERY AND ENDOSCOPICALLY HARVESTED RIGHT GREATER SAPHENOUS VEINS. (N/A) ECHOCARDIOGRAM, TRANSESOPHAGEAL (N/A) POD#8  1 afeb, VSS, sinus/PAC's, rate controlled, on amio/metoprolol  and eliquis  started yesterday- also on baby asa  2 O2 sats good on RA 3 weight below preop, not on diuretics 4 K+ 3.5, cont to replace, likely related to diarrhea which is still present but appears to be slowing, , renal fxn remains normal- received 160 meq K+  yesterday 5 MG++ 2.1- on mag oxide 6 no leukocytosis 7 H/H stable 8 ambulation improving 9 no nausea w FLD, will advance to heart healthy 10  hopefully stable for D/C over next 1-2 days   LOS: 8 days    Lemond FORBES Cera PA-C Pager [phone]  01/20/2024  Agree. Advancing diet. If he tolerates a regular diet he can go home tomorrow.   Linnie MALVA Rayas, MD

## 2024-01-20 NOTE — Progress Notes (Signed)
 CARDIAC REHAB PHASE I   PRE:  Rate/Rhythm: 70 sR    BP: sitting 126/73    SpO2:   MODE:  Ambulation: 700 ft   POST:  Rate/Rhythm: 95 SR    BP: sitting 130/72     SpO2:    Pt tolerated well, no assist or c/o. No afib. All questions answered.  [id number]  Aliene Aris BS, ACSM-CEP 01/20/2024 2:57 PM

## 2024-01-21 ENCOUNTER — Other Ambulatory Visit (HOSPITAL_COMMUNITY): Payer: Self-pay

## 2024-01-21 LAB — BASIC METABOLIC PANEL WITH GFR
Anion gap: 6 (ref 5–15)
BUN: 11 mg/dL (ref 8–23)
CO2: 26 mmol/L (ref 22–32)
Calcium: 8.7 mg/dL — ABNORMAL LOW (ref 8.9–10.3)
Chloride: 106 mmol/L (ref 98–111)
Creatinine, Ser: 1.08 mg/dL (ref 0.61–1.24)
GFR, Estimated: >60 mL/min (ref >60)
Glucose, Bld: 109 mg/dL — ABNORMAL HIGH (ref 70–99)
Potassium: 3.8 mmol/L (ref 3.5–5.1)
Sodium: 138 mmol/L (ref 135–145)

## 2024-01-21 MED ORDER — METOPROLOL TARTRATE 25 MG PO TABS
25.0000 mg | ORAL_TABLET | Freq: Two times a day (BID) | ORAL | 1 refills | Status: DC
  Filled 2024-01-21: qty 60, 30d supply, fill #0

## 2024-01-21 MED ORDER — APIXABAN 5 MG PO TABS
5.0000 mg | ORAL_TABLET | Freq: Two times a day (BID) | ORAL | 1 refills | Status: DC
  Filled 2024-01-21: qty 60, 30d supply, fill #0

## 2024-01-21 MED ORDER — CEPHALEXIN 500 MG PO CAPS
500.0000 mg | ORAL_CAPSULE | Freq: Four times a day (QID) | ORAL | 0 refills | Status: AC
  Filled 2024-01-21: qty 27, 7d supply, fill #0

## 2024-01-21 MED ORDER — CEPHALEXIN 500 MG PO CAPS
500.0000 mg | ORAL_CAPSULE | Freq: Four times a day (QID) | ORAL | Status: DC
  Administered 2024-01-21: 500 mg via ORAL
  Filled 2024-01-21: qty 1

## 2024-01-21 MED ORDER — POTASSIUM CHLORIDE CRYS ER 20 MEQ PO TBCR
40.0000 meq | EXTENDED_RELEASE_TABLET | Freq: Once | ORAL | Status: AC
  Administered 2024-01-21: 40 meq via ORAL
  Filled 2024-01-21: qty 2

## 2024-01-21 MED ORDER — AMIODARONE HCL 200 MG PO TABS
ORAL_TABLET | ORAL | 1 refills | Status: DC
  Filled 2024-01-21: qty 37, 30d supply, fill #0

## 2024-01-21 MED ORDER — TRAMADOL HCL 50 MG PO TABS
50.0000 mg | ORAL_TABLET | Freq: Four times a day (QID) | ORAL | 0 refills | Status: AC | PRN
  Filled 2024-01-21: qty 28, 7d supply, fill #0

## 2024-01-21 MED ORDER — POTASSIUM CHLORIDE CRYS ER 20 MEQ PO TBCR
40.0000 meq | EXTENDED_RELEASE_TABLET | Freq: Once | ORAL | 0 refills | Status: AC
  Filled 2024-01-21: qty 2, 1d supply, fill #0

## 2024-01-21 MED ORDER — POTASSIUM CHLORIDE CRYS ER 20 MEQ PO TBCR: 40.0000 meq | EXTENDED_RELEASE_TABLET | Freq: Once | ORAL | Status: DC

## 2024-01-21 NOTE — Progress Notes (Signed)
 Sutures removed patient tolerated well

## 2024-01-21 NOTE — Progress Notes (Addendum)
 DISCHARGE NOTE HOME Jeffrey [name] to be discharged Home per MD order. Discussed prescriptions and follow up appointments with the patient. Prescriptions given to patient; medication list explained in detail. Patient verbalized understanding.  Skin clean, dry and intact without evidence of skin break down, no evidence of skin tears noted. IV catheter discontinued intact. Site without signs and symptoms of complications. Dressing and pressure applied. Pt denies pain at the site currently. No complaints noted.  See LDA for incisons at discharge Patient free of lines, drains, and wounds.   An After Visit Summary (AVS) was printed and given to the patient. Patient escorted via wheelchair, and discharged home via private auto.  [name], RN

## 2024-01-21 NOTE — Progress Notes (Signed)
 DISCHARGE NOTE HOME Jeffrey [name] to be discharged Home per MD order. Discussed prescriptions and follow up appointments with the patient. Prescriptions given to patient; medication list explained in detail. Patient verbalized understanding.  Skin clean, dry and intact without evidence of skin break down, no evidence of skin tears noted. IV catheter discontinued intact. Site without signs and symptoms of complications. Dressing and pressure applied. Pt denies pain at the site currently. No complaints noted.  See LDA for incision and Patient free of lines, drains, and wounds.   An After Visit Summary (AVS) was printed and given to the patient. Patient escorted via wheelchair, and discharged home via private auto.  [name], RN

## 2024-01-21 NOTE — Progress Notes (Signed)
 CARDIAC REHAB PHASE I   PRE:  Rate/Rhythm: 69 SR w/ PVCs  BP:  Supine: 126/75 Sitting:   Standing:    SaO2: 96% RA  MODE:  Ambulation: 700 ft   POST:  Rate/Rhythm: 114 irregular  BP:  Supine:   Sitting: 148/86  Standing:    SaO2: 91% increased to 100% RA   Very pleasant and eager to walk. Tolerated ambulation well with assist x1. Standing rest x2 taken due to shortness of breath, SaO2 91%. Encouraged pursed-lip breathing, recovered to 100% RA. Symptoms resolved with rest. Denies any other symptoms. Informed Mr. Roback nurse of SOB with ambulation.    Arnoldo CHRISTELLA Gal, MS, ACSM SHIELA 01/21/2024 [phone]

## 2024-01-21 NOTE — Progress Notes (Addendum)
 [address] [employer] [address]             [phone]      9 Days Post-Op Procedure(s) (LRB): CORONARY ARTERY BYPASS GRAFTING (CABG) TIMES FIVE USING LEFT INTERNAL MAMMARY ARTERY AND ENDOSCOPICALLY HARVESTED RIGHT GREATER SAPHENOUS VEINS. (N/A) ECHOCARDIOGRAM, TRANSESOPHAGEAL (N/A) Subjective: Patient reports he is starting to feel anxious being in the room and in the hospital bed for so long.   Objective: Vital signs in last 24 hours: Temp:  [97.9 F (36.6 C)-98.4 F (36.9 C)] 98.4 F (36.9 C) (09/06 0227) Pulse Rate:  [60-79] 60 (09/06 0227) Cardiac Rhythm: Atrial fibrillation;Bundle branch block (09/05 1952) Resp:  [17-20] 19 (09/06 0545) BP: (112-131)/(55-77) 112/64 (09/06 0227) SpO2:  [94 %-100 %] 95 % (09/06 0227) Weight:  [888 kg] 111 kg (09/06 0545)  Hemodynamic parameters for last 24 hours:    Intake/Output from previous day: No intake/output data recorded. Intake/Output this shift: No intake/output data recorded.  General appearance: alert, cooperative, and no distress Neurologic: intact Heart: regular rate and rhythm, S1, S2 normal, no murmur, click, rub or gallop Lungs: slightly diminished bibasilar breath sounds Abdomen: distension, +BS, nontender, no guarding or rebound tenderness Extremities: extremities normal, atraumatic, no cyanosis or edema Wound: Clean and dry EVH and sternal sites without sign of infection. Right wrist IV site bleeding with dressing in place, hot to touch, some erythema and significant swelling of the right hand and forearm, nontender  Lab Results: Recent Labs    01/19/24 0332 01/20/24 0313  WBC 8.4 8.4  HGB 9.3* 9.0*  HCT 27.8* 27.0*  PLT 274 298   BMET:  Recent Labs    01/20/24 0313 01/21/24 0401  NA 139 138  K 3.5 3.8  CL 104 106  CO2 25 26  GLUCOSE 101* 109*  BUN 8 11  CREATININE 1.10 1.08  CALCIUM  8.6* 8.7*    PT/INR: No results for input(s): LABPROT, INR in the last 72  hours. ABG    Component Value Date/Time   PHART 7.337 (L) 01/12/2024 1841   HCO3 20.6 01/12/2024 1841   TCO2 22 01/12/2024 1841   ACIDBASEDEF 5.0 (H) 01/12/2024 1841   O2SAT 94 01/12/2024 1841   CBG (last 3)  No results for input(s): GLUCAP in the last 72 hours.  Assessment/Plan: S/P Procedure(s) (LRB): CORONARY ARTERY BYPASS GRAFTING (CABG) TIMES FIVE USING LEFT INTERNAL MAMMARY ARTERY AND ENDOSCOPICALLY HARVESTED RIGHT GREATER SAPHENOUS VEINS. (N/A) ECHOCARDIOGRAM, TRANSESOPHAGEAL (N/A)  CV: Postoperative afib. NSR, HR 70s this AM. A couple short bursts of atrial fibrillation with controlled rate, max HR 100. HR of 120 recorded but RN reports it did not look like afib and no strip was saved on tele. On Lopressor  25mg  BID, PO Amiodarone  200mg  BID, and Eliquis . BP 112/64 this AM.   Pulm: Saturating well on RA. Encourage IS and ambulation.   GI: Postoperative colitis, heart healthy diet started yesterday. Tolerating full diet, distension improving but still with significant distension. No tenderness. General surgery recommended supportive care, no abx or surgery, they have signed off. Developed diarrhea, this is improving.   Renal: Cr 1.08, stable. K 3.8, likely due to diarrhea. Not on diuretic. Will supplement.   ID: No leukocytosis, afebrile. No indication for abx for colitis per general surgery.   Expected postop ABLA: H/H 9/27, stable. Not clinically significant at this time.   DVT Prophylaxis: No lovenox , on Eliquis .   Phlebitis: Right hand and forearm swelling  and hot to touch, some erythema. Previous IV site bleeding with dressing in place, possible previous IV infiltration. Patient reports this has been there since CT scan on 09/03. Will start 1 week of Cephalexin . Continue warm compresses and elevation.   Dispo: Likely discharge home today since he is tolerating full diet and heart rate and rhythm are controlled.    LOS: 9 days    Con GORMAN Bend, PA-C 01/21/2024    Chart reviewed, patient examined, agree with above.  He has phlebitis in right hand/forearm from IV. Will treat with Keflex  and warm compresses/elevation.  He says his belly feels fine and bowels working. No abd pain. Good appetite.  He would like to go home today which should be ok. He feels like he will be able to recover better at home.

## 2024-01-23 DIAGNOSIS — I10 Essential (primary) hypertension: Secondary | ICD-10-CM | POA: Diagnosis not present

## 2024-01-23 DIAGNOSIS — I2510 Atherosclerotic heart disease of native coronary artery without angina pectoris: Secondary | ICD-10-CM | POA: Diagnosis not present

## 2024-01-23 DIAGNOSIS — Z48812 Encounter for surgical aftercare following surgery on the circulatory system: Secondary | ICD-10-CM | POA: Diagnosis not present

## 2024-01-24 ENCOUNTER — Ambulatory Visit (HOSPITAL_COMMUNITY)

## 2024-01-24 ENCOUNTER — Other Ambulatory Visit (HOSPITAL_COMMUNITY)

## 2024-01-24 DIAGNOSIS — I10 Essential (primary) hypertension: Secondary | ICD-10-CM | POA: Diagnosis not present

## 2024-01-24 DIAGNOSIS — I2510 Atherosclerotic heart disease of native coronary artery without angina pectoris: Secondary | ICD-10-CM | POA: Diagnosis not present

## 2024-01-24 NOTE — Progress Notes (Unsigned)
 Cardiology Office Note    Date:  01/24/2024  ID:  Briston Lax, DOB [birth year], MRN [id number] PCP:  Ransom Other, MD  Cardiologist:  Lonni Cash, MD  Electrophysiologist:  None   Chief Complaint: ***  History of Present Illness: .    Choua Chalker is a 68 y.o. male with visit-pertinent history of ***  Patient with history of NSTEMI in 2017 secondary to subtotal thrombotic occlusion of the mid circumflex artery treated with a DES.  Patient also with moderate disease in the ostium of the obtuse marginal branch and diffuse moderate disease in the LAD and RCA.  Patient with nuclear stress test in September 2022 with no ischemia.  Patient was seen in clinic on 12/09/2023 by Dr. Santo, patient noted increased chest pain with activity such as with playing of golf.  Patient underwent LHC on 12/15/2023 that showed severe ostial LAD stenosis, moderate mid LAD stenosis, severe stenosis in the ostium of the first obtuse marginal branch, severe ostial stenosis of the second obtuse marginal branch, severe stenosis of the distal AV groove circumflex involving the third obtuse marginal branch, patient overall with severe three-vessel CAD and was referred to CT surgery for CABG.  Echo on 12/16/2023 indicated LVEF 65 to 70%, no RWMA, mild concentric LVH, G1 DD, RV systolic function and size was normal, mild dilation of the acing aorta measuring 40 mm.  On 01/12/2024 patient underwent CABG x 5 utilizing LIMA to LAD, SVG to OM1, SVG to OM 2, SVG to OM 3 and SVG to distal RCA.  Patient had PAF and was started on Amio drip, he did develop a possible ileus and was given laxatives.  Patient converted to sinus rhythm and was transition to oral amiodarone .  Patient continued to have evidence of PAF and was started on anticoagulation therapy with Eliquis , Plavix  was discontinued and he was kept on baby aspirin .  Patient's ileus continue to be a problem while inpatient with significant colonic distention noted on  abdominal x-ray, CT scan revealed some evidence of colitis at the hepatic flexure without any evidence of obstruction, general surgery was consulted, abdominal distention improved with time.  Patient was noted to have phlebitis of the right hand and forearm, Keflex  was started.  Patient was discharged home on 01/21/2024 on Eliquis  and Plavix  was not restarted.  Today he presents for follow-up.  He reports that he  CAD: S/p CABG x 5 with LIMA to LAD, SVG to OM1, SVG to OM2, SVG to OM 3 SVG to distal RCA. Today he reports Will defer starting of cardiac rehab to [facility]. Reviewed ED precautions. Continue  PAF: Patient noted to have paroxysmal atrial fibrillation in the postop period, was started on IV amiodarone  and then transition to oral amiodarone , noted to continue having episodes of PAF and was started on Eliquis . EKG today indicates Today he reports  HTN: Blood pressure today   HLD: Last lipid profile on    Labwork independently reviewed:   ROS: .   *** denies chest pain, shortness of breath, lower extremity edema, fatigue, palpitations, melena, hematuria, hemoptysis, diaphoresis, weakness, presyncope, syncope, orthopnea, and PND.  All other systems are reviewed and otherwise negative.  Studies Reviewed: SABRA    EKG:  EKG is ordered today, personally reviewed, demonstrating ***     CV Studies: Cardiac studies reviewed are outlined and summarized above. Otherwise please see EMR for full report. Cardiac Studies & Procedures   ______________________________________________________________________________________________ CARDIAC CATHETERIZATION  CARDIAC CATHETERIZATION 12/15/2023  Conclusion   Prox  RCA lesion is 70% stenosed.   Mid RCA lesion is 60% stenosed.   1st Mrg lesion is 80% stenosed.   2nd Mrg lesion is 80% stenosed.   Mid Cx to Dist Cx lesion is 90% stenosed.   Prox Cx lesion is 20% stenosed.   Ost LAD to Prox LAD lesion is 90% stenosed.   Mid LAD lesion is 60%  stenosed.   The left ventricular ejection fraction is 50-55% by visual estimate.  Severe ostial LAD stenosis. Moderate mid LAD stenosis. Patent proximal Circumflex stent with minimal restenosis. Severe stenosis in the ostium of the first obtuse marginal branch (jailed by the stent). Severe ostial stenosis second obtuse marginal branch. Severe stenosis distal AV groove Circumflex involving the third obtuse marginal branch. Large dominant RCA with moderately severe mid vessel stenosis Normal LV systolic function.  Recommendations: He has severe three vessel CAD including ostial LAD stenosis. Will refer to CT surgery for CABG as outpatient.  Findings Coronary Findings Diagnostic  Dominance: Right  Left Anterior Descending Vessel is large. Ost LAD to Prox LAD lesion is 90% stenosed. Mid LAD lesion is 60% stenosed.  Left Circumflex Vessel is large. Prox Cx lesion is 20% stenosed. The lesion was previously treated using a drug eluting stent over 2 years ago. Mid Cx to Dist Cx lesion is 90% stenosed.  First Obtuse Marginal Branch 1st Mrg lesion is 80% stenosed.  Second Obtuse Marginal Branch 2nd Mrg lesion is 80% stenosed.  Right Coronary Artery Vessel is large. Prox RCA lesion is 70% stenosed. Mid RCA lesion is 60% stenosed.  Intervention  No interventions have been documented.   CARDIAC CATHETERIZATION  CARDIAC CATHETERIZATION 02/26/2016  Conclusion  Prox RCA lesion, 50 %stenosed.  Mid RCA to Dist RCA lesion, 30 %stenosed.  1st Mrg lesion, 65 %stenosed.  Prox LAD to Dist LAD lesion, 30 %stenosed.  Dist LAD lesion, 60 %stenosed.  Ost LAD to Prox LAD lesion, 50 %stenosed.  The left ventricular systolic function is normal.  A STENT SYNERGY DES 3.5X24 drug eluting stent was successfully placed, and does not overlap previously placed stent.  Mid Cx to Dist Cx lesion, 99 %stenosed.  Post intervention, there is a 15% residual stenosis.  Ost 1st Mrg to 1st Mrg  lesion, 85 %stenosed.   Moderate to moderately severe diffuse coronary artery disease involving the entire right coronary artery, and the left anterior descending with blockages up to 50% in multiple areas.  Severe involvement of the circumflex artery with 99% mid vessel stenosis, thrombus, and TIMI grade 2 flow. The first obtuse marginal contains ostial 70% stenosis.  Inferobasal hypokinesis. EF 55%.  Successful PCI with angioplasty and stenting of the mid circumflex with a 24 x 3.5 mm Synergy drug-eluting stent postdilated to 3.75 mm in diameter. Less than 20% stenosis was noted throughout the stent.  The proximal margin of the stent slightly overlap the ostium of the first obtuse marginal had 85% stenosis was noted post stent deployment. This branch was not treated.  CONCLUSIONS:   Aspirin  and Brilinta  12 months  Aggressive risk factor modification  Aggressive blood pressure control to include low-dose beta blocker therapy if heart rate allows.  Anticipate discharge in a.m.  Findings Coronary Findings Diagnostic  Dominance: Co-dominant  Left Anterior Descending  First Diagonal Branch Vessel is small in size.  Second Diagonal Branch Vessel is small in size.  Third Diagonal Branch Vessel is small in size.  Ramus Intermedius Vessel is small.  Left Circumflex The lesion is eccentric.  First Obtuse Marginal Branch  Third Obtuse Marginal Branch Vessel is small in size.  Right Coronary Artery  Intervention  Mid Cx to Dist Cx lesion Angioplasty Lesion length: 20 mm. Lesion crossed with guidewire using a WIRE ASAHI PROWATER 180CM. Pre-stent angioplasty was performed using a BALLOON EMERGE MR 3.0X15. Maximum pressure: 10 atm. A STENT SYNERGY DES 3.5X24 drug eluting stent was successfully placed, and does not overlap previously placed stent. Stent strut is well apposed. Post-stent angioplasty was performed using a BALLOON [place] EUPHORA [id number]. Maximum pressure: 14  atm. The pre-interventional distal flow is decreased (TIMI 2).  The post-interventional distal flow is normal (TIMI 3). The intervention was successful . No complications occurred at this lesion. Pressure wire/FFR was not performed on the lesion . IVUS was not performed on the lesion . There is a 15% residual stenosis post intervention.   STRESS TESTS  MYOCARDIAL PERFUSION IMAGING 02/04/2021  Interpretation Summary   Findings are consistent with prior myocardial infarction with peri-infarct ischemia. The study is low risk.   No ST deviation was noted.   LV perfusion is abnormal. Defect 1: There is a medium defect with mild reduction in uptake present in the apical to basal inferolateral location(s) that is partially reversible. There is normal wall motion in the defect area. Consistent with infarction and peri-infarct ischemia.   Left ventricular function is normal. Nuclear stress EF: 55 %. The left ventricular ejection fraction is normal (55-65%). End diastolic cavity size is normal. End systolic cavity size is normal.   Prior study available for comparison from 02/19/2016. There are changes compared to prior study which appear to be improved. The left ventricular ejection fraction has increased. Segmental wall-motion abnormalities have improved.  Compared with the nuclear stress in 2017, systolic function and perfusion have improved.  There is evidence of a small prior infarct with mild peri-infarct ischemia.   ECHOCARDIOGRAM  ECHOCARDIOGRAM COMPLETE 12/16/2023  Narrative ECHOCARDIOGRAM REPORT    Patient Name:   Diyari Cherne  Date of Exam: 12/16/2023 Medical Rec #:  [id number]     Height:       70.0 in Accession #:    [id number]    Weight:       245.0 lb Date of Birth:  Feb 22, 1956      BSA:          2.275 m Patient Age:    68 years      BP:           174/84 mmHg Patient Gender: M             HR:           59 bpm. Exam Location:  [address]  Procedure: 2D Echo, 3D Echo, Cardiac Doppler,  Color Doppler and Strain Analysis (Both Spectral and Color Flow Doppler were utilized during procedure).  Indications:    R07.9 Chest pain CAD I25.10  History:        Patient has no prior history of Echocardiogram examinations. Previous Myocardial Infarction, CAD and h/o PCIs, LHC 12/15/23 Severe 3v CAD including severe ostial LAD stenosis LVEF 55%; Risk Factors:Hypertension, Dyslipidemia and Former Smoker.  Sonographer:    Nolon Berg BA, RDCS Referring Phys: KARRAR HUSAIN  IMPRESSIONS   1. Left ventricular ejection fraction, by estimation, is 65 to 70%. Left ventricular ejection fraction by 3D volume is 61 %. The left ventricle has normal function. The left ventricle has no regional wall motion abnormalities. There is mild concentric left ventricular hypertrophy. Left ventricular diastolic  parameters are consistent with Grade I diastolic dysfunction (impaired relaxation). The average left ventricular global longitudinal strain is -17.8 %. The global longitudinal strain is normal. 2. Right ventricular systolic function is normal. The right ventricular size is normal. 3. Left atrial size was mild to moderately dilated. 4. The mitral valve is abnormal. Trivial mitral valve regurgitation. No evidence of mitral stenosis. 5. The aortic valve is tricuspid. There is mild calcification of the aortic valve. Aortic valve regurgitation is not visualized. Aortic valve sclerosis/calcification is present, without any evidence of aortic stenosis. 6. Aortic dilatation noted. There is mild dilatation of the ascending aorta, measuring 40 mm. 7. The inferior vena cava is normal in size with greater than 50% respiratory variability, suggesting right atrial pressure of 3 mmHg.  FINDINGS Left Ventricle: Left ventricular ejection fraction, by estimation, is 65 to 70%. Left ventricular ejection fraction by 3D volume is 61 %. The left ventricle has normal function. The left ventricle has no regional wall  motion abnormalities. The average left ventricular global longitudinal strain is -17.8 %. Strain was performed and the global longitudinal strain is normal. The left ventricular internal cavity size was normal in size. There is mild concentric left ventricular hypertrophy. Left ventricular diastolic parameters are consistent with Grade I diastolic dysfunction (impaired relaxation).  Right Ventricle: The right ventricular size is normal. No increase in right ventricular wall thickness. Right ventricular systolic function is normal.  Left Atrium: Left atrial size was mild to moderately dilated.  Right Atrium: Right atrial size was normal in size.  Pericardium: There is no evidence of pericardial effusion.  Mitral Valve: The mitral valve is abnormal. There is mild calcification of the mitral valve leaflet(s). Mild mitral annular calcification. Trivial mitral valve regurgitation. No evidence of mitral valve stenosis.  Tricuspid Valve: The tricuspid valve is normal in structure. Tricuspid valve regurgitation is not demonstrated. No evidence of tricuspid stenosis.  Aortic Valve: The aortic valve is tricuspid. There is mild calcification of the aortic valve. Aortic valve regurgitation is not visualized. Aortic valve sclerosis/calcification is present, without any evidence of aortic stenosis.  Pulmonic Valve: The pulmonic valve was normal in structure. Pulmonic valve regurgitation is trivial. No evidence of pulmonic stenosis.  Aorta: Aortic dilatation noted. There is mild dilatation of the ascending aorta, measuring 40 mm.  Venous: The inferior vena cava is normal in size with greater than 50% respiratory variability, suggesting right atrial pressure of 3 mmHg.  IAS/Shunts: No atrial level shunt detected by color flow Doppler.  Additional Comments: 3D was performed not requiring image post processing on an independent workstation and was normal.   LEFT VENTRICLE PLAX 2D LVIDd:         4.90 cm          Diastology LVIDs:         3.40 cm         LV e' medial:    5.51 cm/s LV PW:         1.20 cm         LV E/e' medial:  18.7 LV IVS:        0.90 cm         LV e' lateral:   4.10 cm/s LVOT diam:     2.30 cm         LV E/e' lateral: 25.2 LV SV:         100 LV SV Index:   44  2D Longitudinal LVOT Area:     4.15 cm        Strain 2D Strain GLS   -17.6 % (A4C): 2D Strain GLS   -17.6 % (A3C): 2D Strain GLS   -18.2 % (A2C): 2D Strain GLS   -17.8 % Avg:  3D Volume EF LV 3D EF:    Left ventricul ar ejection fraction by 3D volume is 61 %.  3D Volume EF: 3D EF:        61 % LV EDV:       139 ml LV ESV:       55 ml LV SV:        84 ml  RIGHT VENTRICLE RV Basal diam:  3.40 cm  LEFT ATRIUM              Index        RIGHT ATRIUM           Index LA diam:        4.10 cm  1.80 cm/m   RA Area:     14.20 cm LA Vol (A2C):   103.0 ml 45.27 ml/m  RA Volume:   32.60 ml  14.33 ml/m LA Vol (A4C):   77.6 ml  34.10 ml/m LA Biplane Vol: 91.9 ml  40.39 ml/m AORTIC VALVE LVOT Vmax:   112.00 cm/s LVOT Vmean:  72.867 cm/s LVOT VTI:    0.240 m  AORTA Ao Root diam: 3.50 cm Ao Asc diam:  3.85 cm  MITRAL VALVE MV Area (PHT): 2.59 cm     SHUNTS MV Decel Time: 293 msec     Systemic VTI:  0.24 m MV E velocity: 103.17 cm/s  Systemic Diam: 2.30 cm MV A velocity: 135.00 cm/s MV E/A ratio:  0.76  Toribio Fuel MD Electronically signed by Toribio Fuel MD Signature Date/Time: 12/16/2023/10:46:01 PM    Final          ______________________________________________________________________________________________       Current Reported Medications:.    No outpatient medications have been marked as taking for the 01/25/24 encounter (Appointment) with Nesha Counihan D, NP.    Physical Exam:    VS:  There were no vitals taken for this visit.   Wt Readings from Last 3 Encounters:  01/21/24 244 lb 11.2 oz (111 kg)  01/11/24 243 lb (110.2 kg)  12/30/23 247 lb 1.6  oz (112.1 kg)    GEN: Well nourished, well developed in no acute distress NECK: No JVD; No carotid bruits CARDIAC: ***RRR, no murmurs, rubs, gallops RESPIRATORY:  Clear to auscultation without rales, wheezing or rhonchi  ABDOMEN: Soft, non-tender, non-distended EXTREMITIES:  No edema; No acute deformity     Asessement and Plan:.     ***  {The patient has an active order for outpatient cardiac rehabilitation.   Please indicate if the patient is ready to start. Do NOT delete this.  It will auto delete.  Refresh note, then sign.              Click here to document readiness and see contraindications.  :1}  Cardiac Rehabilitation Eligibility Assessment      Disposition: F/u with ***  Signed, Donyell Ding D Shelia Kingsberry, NP

## 2024-01-25 ENCOUNTER — Other Ambulatory Visit: Payer: Self-pay

## 2024-01-25 ENCOUNTER — Telehealth (HOSPITAL_COMMUNITY): Payer: Self-pay

## 2024-01-25 ENCOUNTER — Ambulatory Visit: Attending: Cardiovascular Disease | Admitting: Cardiology

## 2024-01-25 ENCOUNTER — Encounter: Payer: Self-pay | Admitting: Cardiology

## 2024-01-25 ENCOUNTER — Other Ambulatory Visit (HOSPITAL_COMMUNITY): Payer: Self-pay

## 2024-01-25 VITALS — BP 112/68 | HR 72 | Ht 70.0 in | Wt 248.0 lb

## 2024-01-25 DIAGNOSIS — I480 Paroxysmal atrial fibrillation: Secondary | ICD-10-CM | POA: Diagnosis not present

## 2024-01-25 DIAGNOSIS — Z951 Presence of aortocoronary bypass graft: Secondary | ICD-10-CM

## 2024-01-25 DIAGNOSIS — I10 Essential (primary) hypertension: Secondary | ICD-10-CM | POA: Diagnosis not present

## 2024-01-25 DIAGNOSIS — I25119 Atherosclerotic heart disease of native coronary artery with unspecified angina pectoris: Secondary | ICD-10-CM | POA: Diagnosis not present

## 2024-01-25 DIAGNOSIS — I2510 Atherosclerotic heart disease of native coronary artery without angina pectoris: Secondary | ICD-10-CM | POA: Diagnosis not present

## 2024-01-25 DIAGNOSIS — E785 Hyperlipidemia, unspecified: Secondary | ICD-10-CM | POA: Diagnosis not present

## 2024-01-25 MED ORDER — APIXABAN 5 MG PO TABS
5.0000 mg | ORAL_TABLET | Freq: Two times a day (BID) | ORAL | 3 refills | Status: DC
  Filled 2024-01-25: qty 180, 90d supply, fill #0

## 2024-01-25 MED ORDER — METOPROLOL TARTRATE 25 MG PO TABS
25.0000 mg | ORAL_TABLET | Freq: Two times a day (BID) | ORAL | 3 refills | Status: DC
  Filled 2024-01-25: qty 60, 30d supply, fill #0

## 2024-01-25 MED ORDER — APIXABAN 5 MG PO TABS: 5.0000 mg | ORAL_TABLET | Freq: Two times a day (BID) | ORAL | 3 refills | Status: DC

## 2024-01-25 MED ORDER — METOPROLOL TARTRATE 25 MG PO TABS: 25.0000 mg | ORAL_TABLET | Freq: Two times a day (BID) | ORAL | 3 refills | Status: DC

## 2024-01-25 MED ORDER — ATORVASTATIN CALCIUM 80 MG PO TABS: ORAL_TABLET | ORAL | 3 refills | Status: DC

## 2024-01-25 MED ORDER — AMIODARONE HCL 200 MG PO TABS: 200.0000 mg | ORAL_TABLET | Freq: Every day | ORAL | 0 refills | Status: DC

## 2024-01-25 MED ORDER — ATORVASTATIN CALCIUM 80 MG PO TABS
80.0000 mg | ORAL_TABLET | Freq: Every day | ORAL | 3 refills | Status: DC
  Filled 2024-01-25: qty 30, 30d supply, fill #0

## 2024-01-25 MED ORDER — AMIODARONE HCL 200 MG PO TABS
200.0000 mg | ORAL_TABLET | Freq: Every day | ORAL | 3 refills | Status: DC
  Filled 2024-01-25: qty 30, 30d supply, fill #0

## 2024-01-25 NOTE — Telephone Encounter (Signed)
 Eliquis  5mg  refill request received. Patient is 68 years old, weight-112.5kg, Crea-1.08 on 01/21/24, Diagnosis-Afib, and last seen by Katlyn West on 01/25/24. Dose is appropriate based on dosing criteria. Will send in refill to requested pharmacy.    Eliquis  refill was sent today by another user to a local [facility], refill personnel came over to states pt is waiting downstairs for this refill and would like this refill sent downstairs.

## 2024-01-25 NOTE — Patient Instructions (Signed)
 Medication Instructions:  Your physician recommends that you continue on your current medications as directed. Please refer to the Current Medication list given to you today.  *If you need a refill on your cardiac medications before your next appointment, please call your pharmacy*  Lab Work: TODAY: CMET, CBC, TSH, & MAG  1 WEEK, COME BACK FASTING FOR:  LIPID  If you have labs (blood work) drawn today and your tests are completely normal, you will receive your results only by: MyChart Message (if you have MyChart) OR A paper copy in the mail If you have any lab test that is abnormal or we need to change your treatment, we will call you to review the results.  Testing/Procedures: None ordered  Follow-Up: At [facility], you and your health needs are our priority.  As part of our continuing mission to provide you with exceptional heart care, our providers are all part of one team.  This team includes your primary Cardiologist (physician) and Advanced Practice Providers or APPs (Physician Assistants and Nurse Practitioners) who all work together to provide you with the care you need, when you need it.  Your next appointment:   3 week(s)  Provider:   Katlyn West, NP          We recommend signing up for the patient portal called MyChart.  Sign up information is provided on this After Visit Summary.  MyChart is used to connect with patients for Virtual Visits (Telemedicine).  Patients are able to view lab/test results, encounter notes, upcoming appointments, etc.  Non-urgent messages can be sent to your provider as well.   To learn more about what you can do with MyChart, go to [email or web address].   Other Instructions

## 2024-01-25 NOTE — Telephone Encounter (Signed)
 Called patient to see if he is interested in the Cardiac Rehab Program. Patient expressed interest. Explained scheduling process and went over insurance, patient verbalized understanding. Will contact patient for scheduling once f/u has been completed.

## 2024-01-25 NOTE — Telephone Encounter (Addendum)
 Pt insurance is active and benefits verified through [employer] Co-pay 0, DED $5,000/$5,000 met, out of pocket $10,000/$6,148.48 met, co-insurance 10%. no pre-authorization required for TCR ONLY. Reynaldo/[employer] 01/25/2024@4 :15, REF# [id number]   TCR/ICR? TCR ONLY Visit(date of service)limitation? 36 visits Can multiple codes be used on the same date of service/visit?(IF ITS A LIMIT) yes    Is this a lifetime maximum or an annual maximum? annual Has the member used any of these services to date? no Is there a time limit (weeks/months) on start of program and/or program completion? no     Will contact patient to see if he is interested in the Cardiac Rehab Program. If interested, patient will need to complete follow up appt. Once completed, patient will be contacted for scheduling upon review by the RN Navigator.

## 2024-01-25 NOTE — Telephone Encounter (Signed)
 Pt is requesting this RX refill to go to [facility]

## 2024-01-26 ENCOUNTER — Ambulatory Visit: Payer: Self-pay | Admitting: Cardiology

## 2024-01-26 DIAGNOSIS — I10 Essential (primary) hypertension: Secondary | ICD-10-CM | POA: Diagnosis not present

## 2024-01-26 DIAGNOSIS — I2510 Atherosclerotic heart disease of native coronary artery without angina pectoris: Secondary | ICD-10-CM | POA: Diagnosis not present

## 2024-01-26 LAB — COMPREHENSIVE METABOLIC PANEL WITH GFR
ALT: 53 IU/L — AB (ref 0–44)
AST: 38 IU/L (ref 0–40)
Albumin: 3.6 g/dL — AB (ref 3.9–4.9)
Alkaline Phosphatase: 82 IU/L (ref 44–121)
BUN/Creatinine Ratio: 23 (ref 10–24)
BUN: 24 mg/dL (ref 8–27)
Bilirubin Total: 0.5 mg/dL (ref 0.0–1.2)
CO2: 23 mmol/L (ref 20–29)
Calcium: 9.3 mg/dL (ref 8.6–10.2)
Chloride: 106 mmol/L (ref 96–106)
Creatinine, Ser: 1.05 mg/dL (ref 0.76–1.27)
Globulin, Total: 2.4 g/dL (ref 1.5–4.5)
Glucose: 86 mg/dL (ref 70–99)
Potassium: 4.5 mmol/L (ref 3.5–5.2)
Sodium: 142 mmol/L (ref 134–144)
Total Protein: 6 g/dL (ref 6.0–8.5)
eGFR: 76 mL/min/1.73 (ref >59)

## 2024-01-26 LAB — CBC
Hematocrit: 29.3 % — ABNORMAL LOW (ref 37.5–51.0)
Hemoglobin: 9 g/dL — ABNORMAL LOW (ref 13.0–17.7)
MCH: 30.5 pg (ref 26.6–33.0)
MCHC: 30.7 g/dL — ABNORMAL LOW (ref 31.5–35.7)
MCV: 99 fL — ABNORMAL HIGH (ref 79–97)
Platelets: 527 x10E3/uL — ABNORMAL HIGH (ref 150–450)
RBC: 2.95 x10E6/uL — ABNORMAL LOW (ref 4.14–5.80)
RDW: 12.9 % (ref 11.6–15.4)
WBC: 7.3 x10E3/uL (ref 3.4–10.8)

## 2024-01-26 LAB — ECHO INTRAOPERATIVE TEE
AV Mean grad: 5 mmHg
AV Peak grad: 10.9 mmHg
Ao pk vel: 1.65 m/s
Height: 70 in
Weight: 3888 [oz_av]

## 2024-01-26 LAB — MAGNESIUM: Magnesium: 2.2 mg/dL (ref 1.6–2.3)

## 2024-01-26 LAB — TSH: TSH: 0.839 u[IU]/mL (ref 0.450–4.500)

## 2024-01-27 ENCOUNTER — Ambulatory Visit
Payer: Self-pay | Attending: Thoracic Surgery (Cardiothoracic Vascular Surgery) | Admitting: Thoracic Surgery (Cardiothoracic Vascular Surgery)

## 2024-01-27 DIAGNOSIS — Z951 Presence of aortocoronary bypass graft: Secondary | ICD-10-CM

## 2024-01-27 DIAGNOSIS — I2510 Atherosclerotic heart disease of native coronary artery without angina pectoris: Secondary | ICD-10-CM | POA: Diagnosis not present

## 2024-01-27 DIAGNOSIS — I10 Essential (primary) hypertension: Secondary | ICD-10-CM | POA: Diagnosis not present

## 2024-01-27 NOTE — Progress Notes (Signed)
     [address]       [name] [address]             [phone]       Patient: Home Provider: Office Consent for Telemedicine visit obtained.  Today's visit was completed via a real-time telehealth (see specific modality noted below). The patient/authorized person provided oral consent at the time of the visit to engage in a telemedicine encounter with the present provider at [facility]. The patient/authorized person was informed of the potential benefits, limitations, and risks of telemedicine. The patient/authorized person expressed understanding that the laws that protect confidentiality also apply to telemedicine. The patient/authorized person acknowledged understanding that telemedicine does not provide emergency services and that he or she would need to call 911 or proceed to the nearest hospital for help if such a need arose.   Total time spent in the clinical discussion 10 minutes.  Telehealth Modality: Phone visit (audio only)  I had a telephone visit with  Jeffrey [name] who is s/p CABG.  Overall doing well.  Pain is minimal.  Ambulating well. Vitals have been stable.  [name] will see us  back in 1 month with a chest x-ray for cardiac rehab clearance.  [name]

## 2024-01-31 DIAGNOSIS — I2510 Atherosclerotic heart disease of native coronary artery without angina pectoris: Secondary | ICD-10-CM | POA: Diagnosis not present

## 2024-01-31 DIAGNOSIS — I10 Essential (primary) hypertension: Secondary | ICD-10-CM | POA: Diagnosis not present

## 2024-02-02 DIAGNOSIS — I2510 Atherosclerotic heart disease of native coronary artery without angina pectoris: Secondary | ICD-10-CM | POA: Diagnosis not present

## 2024-02-02 DIAGNOSIS — I10 Essential (primary) hypertension: Secondary | ICD-10-CM | POA: Diagnosis not present

## 2024-02-03 DIAGNOSIS — K5939 Other megacolon: Secondary | ICD-10-CM | POA: Diagnosis not present

## 2024-02-03 DIAGNOSIS — Z556 Problems related to health literacy: Secondary | ICD-10-CM | POA: Diagnosis not present

## 2024-02-03 DIAGNOSIS — Z7901 Long term (current) use of anticoagulants: Secondary | ICD-10-CM | POA: Diagnosis not present

## 2024-02-03 DIAGNOSIS — Z7982 Long term (current) use of aspirin: Secondary | ICD-10-CM | POA: Diagnosis not present

## 2024-02-03 DIAGNOSIS — E7800 Pure hypercholesterolemia, unspecified: Secondary | ICD-10-CM | POA: Diagnosis not present

## 2024-02-03 DIAGNOSIS — I081 Rheumatic disorders of both mitral and tricuspid valves: Secondary | ICD-10-CM | POA: Diagnosis not present

## 2024-02-03 DIAGNOSIS — E119 Type 2 diabetes mellitus without complications: Secondary | ICD-10-CM | POA: Diagnosis not present

## 2024-02-03 DIAGNOSIS — Z48812 Encounter for surgical aftercare following surgery on the circulatory system: Secondary | ICD-10-CM | POA: Diagnosis not present

## 2024-02-03 DIAGNOSIS — Z951 Presence of aortocoronary bypass graft: Secondary | ICD-10-CM | POA: Diagnosis not present

## 2024-02-03 DIAGNOSIS — I10 Essential (primary) hypertension: Secondary | ICD-10-CM | POA: Diagnosis not present

## 2024-02-03 DIAGNOSIS — I480 Paroxysmal atrial fibrillation: Secondary | ICD-10-CM | POA: Diagnosis not present

## 2024-02-03 DIAGNOSIS — E876 Hypokalemia: Secondary | ICD-10-CM | POA: Diagnosis not present

## 2024-02-03 DIAGNOSIS — Z87891 Personal history of nicotine dependence: Secondary | ICD-10-CM | POA: Diagnosis not present

## 2024-02-03 DIAGNOSIS — I2510 Atherosclerotic heart disease of native coronary artery without angina pectoris: Secondary | ICD-10-CM | POA: Diagnosis not present

## 2024-02-15 NOTE — Progress Notes (Unsigned)
 Cardiology Office Note    Date:  02/16/2024  ID:  Jeffrey [name], DOB 1955/11/27, MRN [id number] PCP:  Ransom Other, MD  Cardiologist:  Lonni Cash, MD  Electrophysiologist:  None   Chief Complaint: Follow up for CAD   History of Present Illness: .    Jeffrey [name] is a 68 y.o. male with visit-pertinent history of CAD s/p CABGx in 12/2023, HTN, HLD, post operative atrial fibrillation.   Patient with history of NSTEMI in 2017 secondary to subtotal thrombotic occlusion of the mid circumflex artery treated with a DES.  Patient also with moderate disease in the ostium of the obtuse marginal branch and diffuse moderate disease in the LAD and RCA.  Patient with nuclear stress test in September 2022 with no ischemia.  Patient was seen in clinic on 12/09/2023 by Dr. Santo, patient noted increased chest pain with activity such as with playing of golf.  Patient underwent LHC on 12/15/2023 that showed severe ostial LAD stenosis, moderate mid LAD stenosis, severe stenosis in the ostium of the first obtuse marginal branch, severe ostial stenosis of the second obtuse marginal branch, severe stenosis of the distal AV groove circumflex involving the third obtuse marginal branch, patient overall with severe three-vessel CAD and was referred to CT surgery for CABG.  Echo on 12/16/2023 indicated LVEF 65 to 70%, no RWMA, mild concentric LVH, G1 DD, RV systolic function and size was normal, mild dilation of the acing aorta measuring 40 mm.  On 01/12/2024 patient underwent CABG x 5 utilizing LIMA to LAD, SVG to OM1, SVG to OM 2, SVG to OM 3 and SVG to distal RCA.  Patient had PAF and was started on Amio drip, he did develop a possible ileus and was given laxatives.  Patient converted to sinus rhythm and was transition to oral amiodarone .  Patient continued to have evidence of PAF and was started on anticoagulation therapy with Eliquis , Plavix  was discontinued and he was kept on baby aspirin .  Patient's ileus  continue to be a problem while inpatient with significant colonic distention noted on abdominal x-ray, CT scan revealed some evidence of colitis at the hepatic flexure without any evidence of obstruction, general surgery was consulted, abdominal distention improved with time.  Patient was noted to have phlebitis of the right hand and forearm, Keflex  was started.  Patient was discharged home on 01/21/2024 on Eliquis  and Plavix  was not restarted.  Patient was last seen in clinic on 01/25/24. He reported that he had been doing well overall. He notes some slight dyspnea with ongoing exertion. Patients EKG indicated sinus arrythmia with frequent PAC's, qt prolonged at 508 ms. Patient labwork reassuring, continued on amiodarone  and Eliquis .   Today he presents for follow up. He reports that he has been doing very well overall.  He denies any chest pain, shortness of breath, lower extremity edema, orthopnea or PND.  He denies any palpitations, presyncope or syncope.  Patient reports that he has been walking at least twice a day and tolerating very well.  He is eager to resume his normal activity.  Patient notes that he did slip while coming down the stairs last week and hit his head on the corner stairwell banister, denies any headache, vision changes, presyncope or syncope, notes that he does have a small knot on his right forehead with some increased bruising around his eye.  Patient denies any significant bleeding.  Patient reports that he did not injure his chest or hit his chest in any way, denies  any movement, popping or clicking of the sternum. ROS: .   Today he denies chest pain, shortness of breath, lower extremity edema, fatigue, palpitations, melena, hematuria, hemoptysis, diaphoresis, weakness, presyncope, syncope, orthopnea, and PND.  All other systems are reviewed and otherwise negative. Studies Reviewed: SABRA   EKG:  EKG is ordered today, personally reviewed, demonstrating  EKG  Interpretation Date/Time:  Thursday February 16 2024 09:46:46 EDT Ventricular Rate:  52 PR Interval:  254 QRS Duration:  116 QT Interval:  508 QTC Calculation: 472 R Axis:   72  Text Interpretation: Sinus bradycardia with 1st degree A-V block Nonspecific ST and T wave abnormality When compared with ECG of 25-Jan-2024 09:23, No PACs present Confirmed by Krystianna Soth [phone]) on 02/16/2024 12:42:42 PM   CV Studies: Cardiac studies reviewed are outlined and summarized above. Otherwise please see EMR for full report. Cardiac Studies & Procedures   ______________________________________________________________________________________________ CARDIAC CATHETERIZATION  CARDIAC CATHETERIZATION 12/15/2023  Conclusion   Prox RCA lesion is 70% stenosed.   Mid RCA lesion is 60% stenosed.   1st Mrg lesion is 80% stenosed.   2nd Mrg lesion is 80% stenosed.   Mid Cx to Dist Cx lesion is 90% stenosed.   Prox Cx lesion is 20% stenosed.   Ost LAD to Prox LAD lesion is 90% stenosed.   Mid LAD lesion is 60% stenosed.   The left ventricular ejection fraction is 50-55% by visual estimate.  Severe ostial LAD stenosis. Moderate mid LAD stenosis. Patent proximal Circumflex stent with minimal restenosis. Severe stenosis in the ostium of the first obtuse marginal branch (jailed by the stent). Severe ostial stenosis second obtuse marginal branch. Severe stenosis distal AV groove Circumflex involving the third obtuse marginal branch. Large dominant RCA with moderately severe mid vessel stenosis Normal LV systolic function.  Recommendations: He has severe three vessel CAD including ostial LAD stenosis. Will refer to CT surgery for CABG as outpatient.  Findings Coronary Findings Diagnostic  Dominance: Right  Left Anterior Descending Vessel is large. Ost LAD to Prox LAD lesion is 90% stenosed. Mid LAD lesion is 60% stenosed.  Left Circumflex Vessel is large. Prox Cx lesion is 20% stenosed. The lesion was  previously treated using a drug eluting stent over 2 years ago. Mid Cx to Dist Cx lesion is 90% stenosed.  First Obtuse Marginal Branch 1st Mrg lesion is 80% stenosed.  Second Obtuse Marginal Branch 2nd Mrg lesion is 80% stenosed.  Right Coronary Artery Vessel is large. Prox RCA lesion is 70% stenosed. Mid RCA lesion is 60% stenosed.  Intervention  No interventions have been documented.   CARDIAC CATHETERIZATION  CARDIAC CATHETERIZATION 02/26/2016  Conclusion  Prox RCA lesion, 50 %stenosed.  Mid RCA to Dist RCA lesion, 30 %stenosed.  1st Mrg lesion, 65 %stenosed.  Prox LAD to Dist LAD lesion, 30 %stenosed.  Dist LAD lesion, 60 %stenosed.  Ost LAD to Prox LAD lesion, 50 %stenosed.  The left ventricular systolic function is normal.  A STENT SYNERGY DES 3.5X24 drug eluting stent was successfully placed, and does not overlap previously placed stent.  Mid Cx to Dist Cx lesion, 99 %stenosed.  Post intervention, there is a 15% residual stenosis.  Ost 1st Mrg to 1st Mrg lesion, 85 %stenosed.   Moderate to moderately severe diffuse coronary artery disease involving the entire right coronary artery, and the left anterior descending with blockages up to 50% in multiple areas.  Severe involvement of the circumflex artery with 99% mid vessel stenosis, thrombus, and TIMI grade 2  flow. The first obtuse marginal contains ostial 70% stenosis.  Inferobasal hypokinesis. EF 55%.  Successful PCI with angioplasty and stenting of the mid circumflex with a 24 x 3.5 mm Synergy drug-eluting stent postdilated to 3.75 mm in diameter. Less than 20% stenosis was noted throughout the stent.  The proximal margin of the stent slightly overlap the ostium of the first obtuse marginal had 85% stenosis was noted post stent deployment. This branch was not treated.  CONCLUSIONS:   Aspirin  and Brilinta  12 months  Aggressive risk factor modification  Aggressive blood pressure control to  include low-dose beta blocker therapy if heart rate allows.  Anticipate discharge in a.m.  Findings Coronary Findings Diagnostic  Dominance: Co-dominant  Left Anterior Descending  First Diagonal Branch Vessel is small in size.  Second Diagonal Branch Vessel is small in size.  Third Diagonal Branch Vessel is small in size.  Ramus Intermedius Vessel is small.  Left Circumflex The lesion is eccentric.  First Obtuse Marginal Branch  Third Obtuse Marginal Branch Vessel is small in size.  Right Coronary Artery  Intervention  Mid Cx to Dist Cx lesion Angioplasty Lesion length: 20 mm. Lesion crossed with guidewire using a WIRE ASAHI PROWATER 180CM. Pre-stent angioplasty was performed using a BALLOON EMERGE MR 3.0X15. Maximum pressure: 10 atm. A STENT SYNERGY DES 3.5X24 drug eluting stent was successfully placed, and does not overlap previously placed stent. Stent strut is well apposed. Post-stent angioplasty was performed using a BALLOON [place] EUPHORA [id number]. Maximum pressure: 14 atm. The pre-interventional distal flow is decreased (TIMI 2).  The post-interventional distal flow is normal (TIMI 3). The intervention was successful . No complications occurred at this lesion. Pressure wire/FFR was not performed on the lesion . IVUS was not performed on the lesion . There is a 15% residual stenosis post intervention.   STRESS TESTS  MYOCARDIAL PERFUSION IMAGING 02/04/2021  Interpretation Summary   Findings are consistent with prior myocardial infarction with peri-infarct ischemia. The study is low risk.   No ST deviation was noted.   LV perfusion is abnormal. Defect 1: There is a medium defect with mild reduction in uptake present in the apical to basal inferolateral location(s) that is partially reversible. There is normal wall motion in the defect area. Consistent with infarction and peri-infarct ischemia.   Left ventricular function is normal. Nuclear stress EF: 55 %. The left  ventricular ejection fraction is normal (55-65%). End diastolic cavity size is normal. End systolic cavity size is normal.   Prior study available for comparison from 02/19/2016. There are changes compared to prior study which appear to be improved. The left ventricular ejection fraction has increased. Segmental wall-motion abnormalities have improved.  Compared with the nuclear stress in 2017, systolic function and perfusion have improved.  There is evidence of a small prior infarct with mild peri-infarct ischemia.   ECHOCARDIOGRAM  ECHOCARDIOGRAM COMPLETE 12/16/2023  Narrative ECHOCARDIOGRAM REPORT    Patient Name:   Jeffrey [name]  Date of Exam: 12/16/2023 Medical Rec #:  [id number]     Height:       70.0 in Accession #:    [id number]    Weight:       245.0 lb Date of Birth:  Sep 27, 1955      BSA:          2.275 m Patient Age:    68 years      BP:           174/84 mmHg Patient Gender: M  HR:           59 bpm. Exam Location:  [address]  Procedure: 2D Echo, 3D Echo, Cardiac Doppler, Color Doppler and Strain Analysis (Both Spectral and Color Flow Doppler were utilized during procedure).  Indications:    R07.9 Chest pain CAD I25.10  History:        Patient has no prior history of Echocardiogram examinations. Previous Myocardial Infarction, CAD and h/o PCIs, LHC 12/15/23 Severe 3v CAD including severe ostial LAD stenosis LVEF 55%; Risk Factors:Hypertension, Dyslipidemia and Former Smoker.  Sonographer:    Nolon Berg BA, RDCS Referring Phys: KARRAR HUSAIN  IMPRESSIONS   1. Left ventricular ejection fraction, by estimation, is 65 to 70%. Left ventricular ejection fraction by 3D volume is 61 %. The left ventricle has normal function. The left ventricle has no regional wall motion abnormalities. There is mild concentric left ventricular hypertrophy. Left ventricular diastolic parameters are consistent with Grade I diastolic dysfunction (impaired relaxation). The average  left ventricular global longitudinal strain is -17.8 %. The global longitudinal strain is normal. 2. Right ventricular systolic function is normal. The right ventricular size is normal. 3. Left atrial size was mild to moderately dilated. 4. The mitral valve is abnormal. Trivial mitral valve regurgitation. No evidence of mitral stenosis. 5. The aortic valve is tricuspid. There is mild calcification of the aortic valve. Aortic valve regurgitation is not visualized. Aortic valve sclerosis/calcification is present, without any evidence of aortic stenosis. 6. Aortic dilatation noted. There is mild dilatation of the ascending aorta, measuring 40 mm. 7. The inferior vena cava is normal in size with greater than 50% respiratory variability, suggesting right atrial pressure of 3 mmHg.  FINDINGS Left Ventricle: Left ventricular ejection fraction, by estimation, is 65 to 70%. Left ventricular ejection fraction by 3D volume is 61 %. The left ventricle has normal function. The left ventricle has no regional wall motion abnormalities. The average left ventricular global longitudinal strain is -17.8 %. Strain was performed and the global longitudinal strain is normal. The left ventricular internal cavity size was normal in size. There is mild concentric left ventricular hypertrophy. Left ventricular diastolic parameters are consistent with Grade I diastolic dysfunction (impaired relaxation).  Right Ventricle: The right ventricular size is normal. No increase in right ventricular wall thickness. Right ventricular systolic function is normal.  Left Atrium: Left atrial size was mild to moderately dilated.  Right Atrium: Right atrial size was normal in size.  Pericardium: There is no evidence of pericardial effusion.  Mitral Valve: The mitral valve is abnormal. There is mild calcification of the mitral valve leaflet(s). Mild mitral annular calcification. Trivial mitral valve regurgitation. No evidence of mitral  valve stenosis.  Tricuspid Valve: The tricuspid valve is normal in structure. Tricuspid valve regurgitation is not demonstrated. No evidence of tricuspid stenosis.  Aortic Valve: The aortic valve is tricuspid. There is mild calcification of the aortic valve. Aortic valve regurgitation is not visualized. Aortic valve sclerosis/calcification is present, without any evidence of aortic stenosis.  Pulmonic Valve: The pulmonic valve was normal in structure. Pulmonic valve regurgitation is trivial. No evidence of pulmonic stenosis.  Aorta: Aortic dilatation noted. There is mild dilatation of the ascending aorta, measuring 40 mm.  Venous: The inferior vena cava is normal in size with greater than 50% respiratory variability, suggesting right atrial pressure of 3 mmHg.  IAS/Shunts: No atrial level shunt detected by color flow Doppler.  Additional Comments: 3D was performed not requiring image post processing on an independent workstation  and was normal.   LEFT VENTRICLE PLAX 2D LVIDd:         4.90 cm         Diastology LVIDs:         3.40 cm         LV e' medial:    5.51 cm/s LV PW:         1.20 cm         LV E/e' medial:  18.7 LV IVS:        0.90 cm         LV e' lateral:   4.10 cm/s LVOT diam:     2.30 cm         LV E/e' lateral: 25.2 LV SV:         100 LV SV Index:   44              2D Longitudinal LVOT Area:     4.15 cm        Strain 2D Strain GLS   -17.6 % (A4C): 2D Strain GLS   -17.6 % (A3C): 2D Strain GLS   -18.2 % (A2C): 2D Strain GLS   -17.8 % Avg:  3D Volume EF LV 3D EF:    Left ventricul ar ejection fraction by 3D volume is 61 %.  3D Volume EF: 3D EF:        61 % LV EDV:       139 ml LV ESV:       55 ml LV SV:        84 ml  RIGHT VENTRICLE RV Basal diam:  3.40 cm  LEFT ATRIUM              Index        RIGHT ATRIUM           Index LA diam:        4.10 cm  1.80 cm/m   RA Area:     14.20 cm LA Vol (A2C):   103.0 ml 45.27 ml/m  RA Volume:   32.60 ml  14.33  ml/m LA Vol (A4C):   77.6 ml  34.10 ml/m LA Biplane Vol: 91.9 ml  40.39 ml/m AORTIC VALVE LVOT Vmax:   112.00 cm/s LVOT Vmean:  72.867 cm/s LVOT VTI:    0.240 m  AORTA Ao Root diam: 3.50 cm Ao Asc diam:  3.85 cm  MITRAL VALVE MV Area (PHT): 2.59 cm     SHUNTS MV Decel Time: 293 msec     Systemic VTI:  0.24 m MV E velocity: 103.17 cm/s  Systemic Diam: 2.30 cm MV A velocity: 135.00 cm/s MV E/A ratio:  0.76  Toribio Fuel MD Electronically signed by Toribio Fuel MD Signature Date/Time: 12/16/2023/10:46:01 PM    Final   TEE  ECHO INTRAOPERATIVE TEE 01/12/2024  Narrative *INTRAOPERATIVE TRANSESOPHAGEAL REPORT *    Patient Name:   Jeffrey [name]   Date of Exam: 01/12/2024 Medical Rec #:  [id number]      Height:       70.0 in Accession #:    [id number]     Weight:       243.0 lb Date of Birth:  25-Jun-1955       BSA:          2.27 m Patient Age:    68 years       BP:           145/75 mmHg Patient Gender: M  HR:           52 bpm. Exam Location:  Anesthesiology  Transesophogeal exam was perform intraoperatively during surgical procedure. Patient was closely monitored under general anesthesia during the entirety of examination.  Indications:     CAD Native Vessel i25.10 Sonographer:     Damien Senior RDCS Performing Phys: [id number] LINNIE KIDD LIGHTFOOT Diagnosing Phys: Lonni Custard MD  Complications: No known complications during this procedure. PRE-OP FINDINGS Left Ventricle: The left ventricle has normal systolic function, with an ejection fraction of 60-65%. The cavity size was normal.   Right Ventricle: The right ventricle has normal systolic function. The cavity was normal. There is no increase in right ventricular wall thickness.  Left Atrium: Left atrial size was not assessed. No left atrial/left atrial appendage thrombus was detected.  Right Atrium: Right atrial size was not assessed.  Interatrial Septum: No atrial level shunt detected by  color flow Doppler. There is no evidence of a patent foramen ovale.  Pericardium: There is no evidence of pericardial effusion.  Mitral Valve: The mitral valve is normal in structure. Mitral valve regurgitation is mild by color flow Doppler. The MR jet is centrally-directed. There is No evidence of mitral stenosis. There is mild thickening and mild calcification present on the mitral valve anterior cusp with normal mobility and there is mild thickening and mild calcification present on the mitral valve posterior cusp with normal mobility. 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 No prolapse or flail of posterior leaflet.  Tricuspid Valve: The tricuspid valve was normal in structure. Tricuspid valve regurgitation is trivial by color flow Doppler. No evidence of tricuspid stenosis is present.  Aortic Valve: The aortic valve is tricuspid Aortic valve regurgitation is mild by color flow Doppler. There is mild stenosis of the aortic valve. There is mild thickening and mild calcification present on the aortic valve right coronary and left coronary cusps with normal mobility.  Pulmonic Valve: The pulmonic valve was normal in structure, with normal. Pulmonic valve regurgitation was not assessed by color flow Doppler.   Aorta: There is evidence of plaque in the descending aorta; Grade III, measuring 3-86mm in size. 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  +--------------+--------++ LEFT VENTRICLE         +--------------+--------++ PLAX 2D                +--------------+--------++ LVOT diam:    2.20 cm  +--------------+--------++ LVOT Area:    3.80 cm +--------------+--------++                        +--------------+--------++  +-------------+------------++ AORTIC VALVE              +-------------+------------++ AV Vmax:     165.00 cm/s  +-------------+------------++ AV Vmean:    105.000  cm/s +-------------+------------++ AV VTI:      0.423 m      +-------------+------------++ AV Peak Grad:10.9 mmHg    +-------------+------------++ AV Mean Grad:5.0 mmHg     +-------------+------------++   +--------------+-------+ SHUNTS                +--------------+-------+ Systemic Diam:2.20 cm +--------------+-------+   Lonni Custard MD Electronically signed by Lonni Custard MD Signature Date/Time: 01/26/2024/11:03:16 AM    Final        ______________________________________________________________________________________________       Current Reported Medications:.    Current Meds  Medication Sig  amLODipine  (NORVASC ) 10 MG tablet Take 1 tablet (10 mg total) by mouth daily.   aspirin  EC 81 MG tablet Take 81 mg by mouth daily. Swallow whole.   Glycerin-Hypromellose-PEG 400 (DRY EYE RELIEF DROPS OP) Place 1 drop into both eyes daily as needed (Dry eyes).   Menthol, Topical Analgesic, (BIOFREEZE EX) Apply 1 Application topically daily as needed (pain).   Multiple Vitamin (MULTIVITAMIN WITH MINERALS) TABS tablet Take 1 tablet by mouth daily.   potassium chloride  SA (KLOR-CON  M) 20 MEQ tablet Take 2 tablets (40 mEq total) by mouth once for 1 dose. Take after 12:00PM on 09/06   sildenafil (REVATIO) 20 MG tablet Take 40 mg by mouth daily as needed (ED).   traMADol  (ULTRAM ) 50 MG tablet Take 1 tablet (50 mg total) by mouth every 6 (six) hours as needed for moderate pain (pain score 4-6).   [DISCONTINUED] amiodarone  (PACERONE ) 200 MG tablet Take 1 tablet (200 mg total) by mouth daily.   [DISCONTINUED] apixaban  (ELIQUIS ) 5 MG TABS tablet Take 1 tablet (5 mg total) by mouth 2 (two) times daily.   [DISCONTINUED] atorvastatin  (LIPITOR ) 80 MG tablet Take 1 tablet (80 mg total) by mouth daily at 6 PM.   [DISCONTINUED] metoprolol  tartrate (LOPRESSOR ) 25 MG tablet Take 1 tablet (25 mg total) by mouth 2 (two) times daily.    Physical Exam:    VS:  BP  120/73   Pulse (!) 51   Ht 5' 10 (1.778 m)   Wt 237 lb 9.6 oz (107.8 kg)   SpO2 98%   BMI 34.09 kg/m    Wt Readings from Last 3 Encounters:  02/16/24 237 lb 9.6 oz (107.8 kg)  01/25/24 248 lb (112.5 kg)  01/21/24 244 lb 11.2 oz (111 kg)    GEN: Well nourished, well developed in no acute distress, small hematoma at the right forhead NECK: No JVD; No carotid bruits CARDIAC: RRR, no murmurs, rubs, gallops RESPIRATORY:  Clear to auscultation without rales, wheezing or rhonchi  ABDOMEN: Soft, non-tender, non-distended EXTREMITIES:  No edema; No acute deformity     Asessement and Plan:.    CAD: S/p CABG x 5 with LIMA to LAD, SVG to OM1, SVG to OM2, SVG to OM 3 SVG to distal RCA. Today he reports that he is doing well, denies any chest pain, increased redness of breath or lower extremity edema. Will defer starting of cardiac rehab to [facility]. Reviewed ED precautions. Check CBC and CMET today. Continue amiodarone  200 mg daily, amlodipine  10 mg daily, Eliquis  5 mg twice daily, aspirin  81 mg daily, Lipitor  80 mg daily, metoprolol  tartrate 25 mg twice daily.  Head injury on anticoagulation: Patient notes that last week he was walking down the stairs when he slipped, landed on his bottom and slid down the stairs, denies hitting the back of his head or his head hitting the ground however he notes that his forehead did hit the corner of the banister at the bottom of the stairs.  Patient denies any significant bleeding, headache, vision changes, dizziness or lightheadedness.  Denies any presyncope or syncope leading to the fall.  Patient reports that he did not hit his chest, has not felt any popping, clicking or movement of his sternum, denies any chest pain.  Patient does have a small bump to the right forehead, there is no evidence of bleeding, he has some bruising around his right eye.  Patient is neurologically intact, perrla.  Patient denies any significant bleeding from his forehead.  Will  order stat  head CT without contrast for further evaluation given use of Eliquis .  Reviewed ED precautions.  PAF/Frequent PACs/Prolonged QT: Patient noted to have paroxysmal atrial fibrillation in the postop period, was started on IV amiodarone  and then transition to oral amiodarone , noted to continue having episodes of PAF and was started on Eliquis . EKG today indicates sinus bradycardia 51 bpm.  QTc has improved, PACs are no longer present.  Patient denies any significant palpitations or feeling of being in atrial fibrillation.  Will reach out to Dr. Verlin regarding duration of amiodarone  therapy. Check CBC and CMET today, patient denies any significant bleeding, patient with fall as noted above. Continue amiodarone  200 mg daily, Eliquis  5 mg twice daily, metoprolol  tartrate 25 mg twice daily.  HTN: Blood pressure today 120/73. Continue current antihypertensive regimen   HLD: Patient notes that his lab work for annual physical be completed in 3 weeks, he will have fasting lab work completed by his PCP.  Will have recheck of lipid profile at that time.  Continue Lipitor  80 mg daily.     Cardiac Rehabilitation Eligibility Assessment  -- (Defer approval for cardiac rehab to [facility])    Disposition: F/u with Joleena Weisenburger, NP or Dr. Verlin in three months or sooner if needed.   Signed, Liylah Najarro D Hansika Leaming, NP

## 2024-02-16 ENCOUNTER — Ambulatory Visit: Attending: Cardiology | Admitting: Cardiology

## 2024-02-16 ENCOUNTER — Encounter: Payer: Self-pay | Admitting: Cardiology

## 2024-02-16 VITALS — BP 120/73 | HR 51 | Ht 70.0 in | Wt 237.6 lb

## 2024-02-16 DIAGNOSIS — I480 Paroxysmal atrial fibrillation: Secondary | ICD-10-CM | POA: Diagnosis not present

## 2024-02-16 DIAGNOSIS — S0990XD Unspecified injury of head, subsequent encounter: Secondary | ICD-10-CM

## 2024-02-16 DIAGNOSIS — I2510 Atherosclerotic heart disease of native coronary artery without angina pectoris: Secondary | ICD-10-CM

## 2024-02-16 DIAGNOSIS — E785 Hyperlipidemia, unspecified: Secondary | ICD-10-CM | POA: Diagnosis not present

## 2024-02-16 DIAGNOSIS — I10 Essential (primary) hypertension: Secondary | ICD-10-CM

## 2024-02-16 DIAGNOSIS — Z951 Presence of aortocoronary bypass graft: Secondary | ICD-10-CM

## 2024-02-16 DIAGNOSIS — S0990XA Unspecified injury of head, initial encounter: Secondary | ICD-10-CM

## 2024-02-16 DIAGNOSIS — D6859 Other primary thrombophilia: Secondary | ICD-10-CM

## 2024-02-16 MED ORDER — METOPROLOL TARTRATE 25 MG PO TABS: 25.0000 mg | ORAL_TABLET | Freq: Two times a day (BID) | ORAL | 3 refills | Status: AC

## 2024-02-16 MED ORDER — APIXABAN 5 MG PO TABS: 5.0000 mg | ORAL_TABLET | Freq: Two times a day (BID) | ORAL | 11 refills | Status: AC

## 2024-02-16 MED ORDER — AMIODARONE HCL 200 MG PO TABS: 200.0000 mg | ORAL_TABLET | Freq: Every day | ORAL | 3 refills | Status: DC

## 2024-02-16 MED ORDER — ATORVASTATIN CALCIUM 80 MG PO TABS: 80.0000 mg | ORAL_TABLET | Freq: Every day | ORAL | 3 refills | Status: AC

## 2024-02-16 NOTE — Patient Instructions (Addendum)
 Medication Instructions:  NO CHANGES  Lab Work: CBC AND A CMET TO BE DONE TODAY.  Testing/Procedures: Non-Cardiac CT scanning, (CAT scanning), is a noninvasive, special x-ray that produces cross-sectional images of the body using x-rays and a computer. CT scans help physicians diagnose and treat medical conditions. For some CT exams, a contrast material is used to enhance visibility in the area of the body being studied. CT scans provide greater clarity and reveal more details than regular x-ray exams.   Follow-Up: At [facility], you and your health needs are our priority.  As part of our continuing mission to provide you with exceptional heart care, our providers are all part of one team.  This team includes your primary Cardiologist (physician) and Advanced Practice Providers or APPs (Physician Assistants and Nurse Practitioners) who all work together to provide you with the care you need, when you need it.  Your next appointment:   3 MONTHS  Provider:   Lonni Cash, MD

## 2024-02-17 ENCOUNTER — Ambulatory Visit: Payer: Self-pay | Admitting: Emergency Medicine

## 2024-02-17 LAB — COMPREHENSIVE METABOLIC PANEL WITH GFR
ALT: 69 IU/L — ABNORMAL HIGH (ref 0–44)
AST: 39 IU/L (ref 0–40)
Albumin: 4.2 g/dL (ref 3.9–4.9)
Alkaline Phosphatase: 88 IU/L (ref 47–123)
BUN/Creatinine Ratio: 16 (ref 10–24)
BUN: 16 mg/dL (ref 8–27)
Bilirubin Total: 0.6 mg/dL (ref 0.0–1.2)
CO2: 24 mmol/L (ref 20–29)
Calcium: 9.4 mg/dL (ref 8.6–10.2)
Chloride: 102 mmol/L (ref 96–106)
Creatinine, Ser: 0.98 mg/dL (ref 0.76–1.27)
Globulin, Total: 2.7 g/dL (ref 1.5–4.5)
Glucose: 86 mg/dL (ref 70–99)
Potassium: 4.5 mmol/L (ref 3.5–5.2)
Sodium: 140 mmol/L (ref 134–144)
Total Protein: 6.9 g/dL (ref 6.0–8.5)
eGFR: 83 mL/min/1.73 (ref >59)

## 2024-02-17 LAB — CBC
Hematocrit: 37.6 % (ref 37.5–51.0)
Hemoglobin: 12 g/dL — ABNORMAL LOW (ref 13.0–17.7)
MCH: 30.5 pg (ref 26.6–33.0)
MCHC: 31.9 g/dL (ref 31.5–35.7)
MCV: 95 fL (ref 79–97)
Platelets: 222 x10E3/uL (ref 150–450)
RBC: 3.94 x10E6/uL — ABNORMAL LOW (ref 4.14–5.80)
RDW: 12.9 % (ref 11.6–15.4)
WBC: 4.7 x10E3/uL (ref 3.4–10.8)

## 2024-02-24 ENCOUNTER — Ambulatory Visit (HOSPITAL_COMMUNITY)
Admission: RE | Admit: 2024-02-24 | Discharge: 2024-02-24 | Disposition: A | Discharge status: Home / Self Care | Source: Ambulatory Visit | Attending: Cardiology | Admitting: Cardiology

## 2024-02-24 DIAGNOSIS — S0990XA Unspecified injury of head, initial encounter: Secondary | ICD-10-CM | POA: Diagnosis not present

## 2024-02-24 DIAGNOSIS — S0003XA Contusion of scalp, initial encounter: Secondary | ICD-10-CM | POA: Diagnosis not present

## 2024-02-26 ENCOUNTER — Telehealth: Payer: Self-pay | Admitting: Cardiology

## 2024-02-26 DIAGNOSIS — I480 Paroxysmal atrial fibrillation: Secondary | ICD-10-CM

## 2024-02-26 NOTE — Telephone Encounter (Signed)
 Please let Jeffrey [name] know that I spoke with Dr. Verlin, lets stop amiodarone  in two weeks. Lets have him wear a 30 day cardiac monitor 2-3 weeks after stopping amiodarone  (4-5 weeks from now). I would like for him to continue his Eliquis  for now, will plan to re-evaluate after cardiac monitor results. Thanks!

## 2024-02-27 ENCOUNTER — Encounter: Payer: Self-pay | Admitting: *Deleted

## 2024-02-27 NOTE — Telephone Encounter (Signed)
 Spoke with patient aware of results with recommendations ( medication Amiodarone / Cardiac monitor  scheduled  and verbalized understanding. Patient aware  to look in mychart for  monitor instruction also. Patient thanked for call.

## 2024-02-27 NOTE — Progress Notes (Signed)
 Patient enrolled for [facility] [employer]/ [employer] to ship a 30 day cardiac event monitor on 03/29/2024 for his 04/01/2024 application. Monitor shipped UPS to [id number], [place], [place] [address] Billing address listed as [address] [address], [place], [place]  [address].  Dr. Verlin to read.

## 2024-02-29 ENCOUNTER — Other Ambulatory Visit: Payer: Self-pay | Admitting: Thoracic Surgery (Cardiothoracic Vascular Surgery)

## 2024-02-29 DIAGNOSIS — Z951 Presence of aortocoronary bypass graft: Secondary | ICD-10-CM

## 2024-03-01 NOTE — Progress Notes (Signed)
 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 Shyrl.  The patient's early postoperative recovery while in the hospital was notable for developing paroxysmal atrial fibrillation and was started on amiodarone  drip. He converted to normal sinus rhythm and amiodarone  was transitioned to oral.  He continued to have some PAF so he was started on Eliquis  for anticoagulation. Plavix  was discontinued but he remained on aspirin . He had an ileus and general surgery was consulted. No intervention was required and ileus improved with time.  He developed phlebitis on his right hand and forearm and was started on keflex . He was stable for discharge home on 01/21/2024.   He presents to the clinic today with his wife and since hospital discharge the patient has been doing well.  He has had no pain and is not taking any medications for pain at this time.  He has been walking 2 times a day.  He reports that he has not had any constipation.  He has been passing gas and having bowel movements.  He denies chest pain, shortness of breath and lower leg edema.   Allergies as of 03/02/2024       Reactions   Brilinta  [ticagrelor ] Shortness Of Breath        Medication List        Accurate as of March 02, 2024 10:37 AM. If you have any questions, ask your nurse or doctor.          amLODipine  10 MG tablet Commonly known as: NORVASC  Take 1 tablet (10 mg total) by mouth daily.   apixaban  5 MG Tabs tablet Commonly known as: ELIQUIS  Take 1 tablet (5 mg total) by mouth 2 (two) times daily.   aspirin  EC 81 MG tablet Take 81 mg by mouth daily. Swallow  whole.   atorvastatin  80 MG tablet Commonly known as: LIPITOR  Take 1 tablet (80 mg total) by mouth daily at 6 PM.   BIOFREEZE EX Apply 1 Application topically daily as needed (pain).   DRY EYE RELIEF DROPS OP Place 1 drop into both eyes daily as needed (Dry eyes).   metoprolol  tartrate 25 MG tablet Commonly known as: LOPRESSOR  Take 1 tablet (25 mg total) by mouth 2 (two) times daily.   multivitamin with minerals Tabs tablet Take 1 tablet by mouth daily.   potassium chloride  SA 20 MEQ tablet Commonly known as: KLOR-CON  M Take 2 tablets (40 mEq total) by mouth once for 1 dose. Take after 12:00PM on 09/06   sildenafil 20 MG tablet Commonly known as: REVATIO Take 40 mg by mouth daily as needed (ED).   traMADol  50 MG tablet Commonly known as: ULTRAM  Take 1 tablet (50 mg total) by mouth every 6 (six) hours as needed for moderate pain (pain score 4-6).         ROS  Review of Systems  Constitutional: Negative.  Negative for malaise/fatigue.  Respiratory: Negative.  Negative for cough, shortness of breath and wheezing.   Cardiovascular:  Negative for chest pain, palpitations and leg swelling.  BP 124/72   Pulse (!) 58   Resp 20   Ht 5' 10 (1.778 m)   Wt 237 lb (107.5 kg)   SpO2 97% Comment: RA  BMI 34.01 kg/m   Physical Exam Constitutional:      Appearance: Normal appearance.  HENT:     Head: Normocephalic and atraumatic.  Cardiovascular:     Rate and Rhythm: Normal rate and regular rhythm.     Heart sounds: Normal heart sounds, S1 normal and S2 normal.  Pulmonary:     Effort: Pulmonary effort is normal.     Breath sounds: Normal breath sounds.  Skin:    General: Skin is warm and dry.      Neurological:     General: No focal deficit present.     Mental Status: He is alert and oriented to person, place, and time.       Imaging: CLINICAL DATA:  Status post CABG.   EXAM: DG CHEST 2V   COMPARISON:  01/17/2024.   FINDINGS: The cardio  pericardial silhouette is enlarged. Trace atelectasis noted in the lung bases, left greater than right. No edema, focal consolidation, or substantial pleural effusion. No acute bony abnormality.   IMPRESSION: Trace bibasilar atelectasis.  Otherwise no acute findings.     Electronically Signed   By: Camellia Candle M.D.   On: 03/02/2024 10:10   Assessment/Plan:  S/P CABG x 5 -We reviewed today's chest x ray.  We discussed driving and he is able to start since he has not required narcotics for pain.  First time driving should be a short distance in the daytime and he can slowly increase from there.  We discussed participation in cardiac rehab and he is cleared from a surgical standpoint at this time.  Sternal precautions can be lifted since he is a full 6 weeks from surgery.  He should continue to be cautious with lifting until a full 3 months from surgery. -He has been seen by cardiology and is to stop amiodarone  in 2 weeks.  He will then wear a 30 day cardiac monitor.  He is to continue amlodipine  10 mg daily, Eliquis  5 mg twice daily, aspirin  81 mg daily, Lipitor  80 mg daily, metoprolol  tartrate 25 mg twice daily  - Follow-up [facility] as needed  Manuelita CHRISTELLA Rough, PA-C 10:37 AM 03/02/24

## 2024-03-02 ENCOUNTER — Ambulatory Visit: Payer: Self-pay

## 2024-03-02 ENCOUNTER — Ambulatory Visit (HOSPITAL_COMMUNITY)
Admission: RE | Admit: 2024-03-02 | Discharge: 2024-03-02 | Disposition: A | Discharge status: Home / Self Care | Source: Ambulatory Visit | Attending: Cardiology | Admitting: Cardiology

## 2024-03-02 VITALS — BP 124/72 | HR 58 | Resp 20 | Ht 70.0 in | Wt 237.0 lb

## 2024-03-02 DIAGNOSIS — Z951 Presence of aortocoronary bypass graft: Secondary | ICD-10-CM

## 2024-03-02 NOTE — Patient Instructions (Signed)

## 2024-03-07 ENCOUNTER — Telehealth (HOSPITAL_COMMUNITY): Payer: Self-pay

## 2024-03-07 NOTE — Telephone Encounter (Signed)
 Called patient to see if he was interested in participating in the Cardiac Rehab Program. Patient will come in for orientation on 11/13 and will attend the 1:45 exercise class.  Pensions consultant.

## 2024-03-09 NOTE — Progress Notes (Signed)
 Jeffrey [name]                                          MRN: [id number]   03/09/2024   The [facility] Quality Team Specialist reviewed this patient medical record for the purposes of chart review for care gap closure. The following were reviewed: abstraction for care gap closure-controlling blood pressure.    [facility] Quality Team

## 2024-03-20 DIAGNOSIS — E782 Mixed hyperlipidemia: Secondary | ICD-10-CM | POA: Diagnosis not present

## 2024-03-20 DIAGNOSIS — Z0000 Encounter for general adult medical examination without abnormal findings: Secondary | ICD-10-CM | POA: Diagnosis not present

## 2024-03-20 DIAGNOSIS — I2510 Atherosclerotic heart disease of native coronary artery without angina pectoris: Secondary | ICD-10-CM | POA: Diagnosis not present

## 2024-03-20 DIAGNOSIS — N182 Chronic kidney disease, stage 2 (mild): Secondary | ICD-10-CM | POA: Diagnosis not present

## 2024-03-20 DIAGNOSIS — Z23 Encounter for immunization: Secondary | ICD-10-CM | POA: Diagnosis not present

## 2024-03-20 DIAGNOSIS — I10 Essential (primary) hypertension: Secondary | ICD-10-CM | POA: Diagnosis not present

## 2024-03-28 ENCOUNTER — Telehealth (HOSPITAL_COMMUNITY): Payer: Self-pay

## 2024-03-28 NOTE — Telephone Encounter (Signed)
 Confirmed cardiac orientation appointment time of 03/29/24 at 1030.  Cardiac health history completed.

## 2024-03-29 ENCOUNTER — Encounter (HOSPITAL_COMMUNITY)
Admission: RE | Admit: 2024-03-29 | Discharge: 2024-03-29 | Disposition: A | Discharge status: Home / Self Care | Source: Ambulatory Visit | Attending: Cardiovascular Disease | Admitting: Cardiovascular Disease

## 2024-03-29 VITALS — BP 110/70 | HR 51 | Ht 71.0 in | Wt 243.2 lb

## 2024-03-29 DIAGNOSIS — Z951 Presence of aortocoronary bypass graft: Secondary | ICD-10-CM | POA: Diagnosis not present

## 2024-03-29 NOTE — Progress Notes (Signed)
 Cardiac Rehab Medication Review   Does the patient  feel that his/her medications are working for him/her?  yes  Has the patient been experiencing any side effects to the medications prescribed?  no  Does the patient measure his/her own blood pressure or blood glucose at home?  yes   Does the patient have any problems obtaining medications due to transportation or finances?   no  Understanding of regimen: excellent Understanding of indications: excellent Potential of compliance: excellent    Comments: No questions regarding medications.    [name] 03/29/2024 11:23 AM

## 2024-03-29 NOTE — Progress Notes (Signed)
 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 Swallow whole., Disp: , Rfl:    atorvastatin  (LIPITOR ) 80 MG tablet, Take 1 tablet (80 mg total) by mouth daily at 6 PM., Disp: 90 tablet, Rfl: 3   Glycerin-Hypromellose-PEG 400 (DRY EYE RELIEF DROPS OP), Place 1 drop into both eyes daily as needed (Dry eyes)., Disp: , Rfl:    Menthol, Topical Analgesic, (BIOFREEZE EX), Apply 1 Application topically daily as needed (pain)., Disp: , Rfl:    metoprolol  tartrate (LOPRESSOR ) 25 MG tablet, Take 1 tablet (25 mg total) by mouth 2 (two) times daily., Disp: 180 tablet, Rfl: 3   Multiple Vitamin (MULTIVITAMIN WITH MINERALS) TABS tablet, Take 1 tablet by mouth daily., Disp: , Rfl:    sildenafil (REVATIO) 20 MG tablet, Take 40 mg by mouth daily as needed (ED)., Disp: , Rfl:    traMADol  (ULTRAM ) 50 MG tablet, Take 1 tablet (50 mg total) by mouth every 6 (six) hours as needed for moderate pain (pain score 4-6)., Disp: 28 tablet, Rfl: 0   potassium chloride  SA (KLOR-CON  M) 20 MEQ tablet, Take 2 tablets (40 mEq total) by mouth once for 1 dose. Take after 12:00PM on 09/06  (Patient not taking: Reported on 03/29/2024), Disp: 2 tablet, Rfl: 0 No current facility-administered medications for this encounter.  Facility-Administered Medications Ordered in Other Encounters:    [facility], Patient & Family Education, , Does not apply, Once, Shyrl Linnie KIDD, MD  Past Medical History: Past Medical History:  Diagnosis Date   Chest wall abscess 2012   lanced it & it's gone   Coronary artery disease    Elevated cholesterol    GERD (gastroesophageal reflux disease)     Tobacco Use: Social History   Tobacco Use  Smoking Status Former   Types: Cigars   Quit date: 01/16/2016   Years since quitting: 8.2  Smokeless Tobacco Never  Tobacco Comments   02/26/2016 might smoke 1/month    Labs: Review Flowsheet  More data may exist      Latest Ref Rng & Units 07/20/2016 08/17/2017 10/25/2018 01/11/2024 01/12/2024  Labs for ITP Cardiac and Pulmonary Rehab  Cholestrol 100 - 199 mg/dL 890  886  888  - -  LDL (calc) 0 - 99 mg/dL 59  61  54  - -  HDL-C >39 mg/dL 41  43  39  - -  Trlycerides 0 - 149 mg/dL 47  47  91  - -  Hemoglobin A1c 4.8 - 5.6 % - - 5.0  5.1  -  PH, Arterial 7.35 - 7.45 - - - - 7.337  7.270  7.302  7.374  7.363  7.378  7.377   PCO2 arterial 32 - 48 mmHg - - - - 38.5  43.8  37.8  38.3  37.4  38.6  36.0   Bicarbonate 20.0 - 28.0 mmol/L - - - - 20.6  20.2  18.8  22.5  21.3  23.0  22.7  21.2   TCO2 22 - 32 mmol/L - - - - 22  22  20  24  23  22  26   33  24  24  23  21  22    Acid-base deficit 0.0 - 2.0 mmol/L - - - - 5.0  7.0  7.0  3.0  4.0  3.0  2.0  4.0   O2 Saturation % - - - - 94  97  98  98  100  81  100  100     Details       Multiple values from one day are sorted in reverse-chronological order         Capillary Blood Glucose: Lab Results  Component Value Date   GLUCAP 101 (H) 01/17/2024   GLUCAP 110 (H) 01/17/2024   GLUCAP 167 (H) 01/16/2024   GLUCAP 118 (H) 01/16/2024   GLUCAP 102 (H) 01/16/2024     Exercise  Target Goals: Exercise Program Goal: Individual exercise prescription set using results from initial 6 min walk test and THRR while considering  patient's activity barriers and safety.   Exercise Prescription Goal: Initial exercise prescription builds to 30-45 minutes a day of aerobic activity, 2-3 days per week.  Home exercise guidelines will be given to patient during program as part of exercise prescription that the participant will acknowledge.  Activity Barriers & Risk Stratification:  Activity Barriers & Cardiac Risk Stratification - 03/29/24 1112       Activity Barriers & Cardiac Risk Stratification   Activity Barriers History of Falls;Balance Concerns;Joint Problems    Cardiac Risk Stratification High          6 Minute Walk:  6 Minute Walk     Row Name 03/29/24 1344         6 Minute Walk   Phase Initial     Distance 1700 feet     Walk Time 6 minutes     # of Rest Breaks 0     MPH 3.22     METS 3.18     RPE 7     Perceived Dyspnea  0     VO2 Peak 11.1     Symptoms No     Resting HR 51 bpm     Resting BP 110/70     Resting Oxygen Saturation  96 %     Exercise Oxygen Saturation  during 6 min walk 97 %     Max Ex. HR 90 bpm     Max Ex. BP 136/76     2 Minute Post BP 124/66        Oxygen Initial Assessment:   Oxygen Re-Evaluation:   Oxygen Discharge (Final Oxygen Re-Evaluation):   Initial Exercise Prescription:  Initial Exercise Prescription - 03/29/24 1100       Date of Initial Exercise RX and Referring Provider   Date 03/29/24    Referring Provider Lonni Cash, MD    Expected Discharge Date 06/20/24      Arm Ergometer   Level 2    Watts 60    RPM 60    Minutes 15    METs 3.18  Recumbant Elliptical   Level 2    RPM 60    Watts 75    Minutes 15    METs 3.18      Prescription Details   Frequency (times per week) 3    Duration Progress to 30 minutes of continuous aerobic without signs/symptoms of physical distress       Intensity   THRR 40-80% of Max Heartrate 60-120    Ratings of Perceived Exertion 11-13    Perceived Dyspnea 0-4      Progression   Progression Continue progressive overload as per policy without signs/symptoms or physical distress.      Resistance Training   Training Prescription Yes    Weight 4 lbs    Reps 10-15          Perform Capillary Blood Glucose checks as needed.  Exercise Prescription Changes:   Exercise Comments:   Exercise Goals and Review:   Exercise Goals     Row Name 03/29/24 1111             Exercise Goals   Increase Physical Activity Yes       Intervention Provide advice, education, support and counseling about physical activity/exercise needs.;Develop an individualized exercise prescription for aerobic and resistive training based on initial evaluation findings, risk stratification, comorbidities and participant's personal goals.       Expected Outcomes Short Term: Attend rehab on a regular basis to increase amount of physical activity.;Long Term: Add in home exercise to make exercise part of routine and to increase amount of physical activity.;Long Term: Exercising regularly at least 3-5 days a week.       Increase Strength and Stamina Yes       Intervention Provide advice, education, support and counseling about physical activity/exercise needs.;Develop an individualized exercise prescription for aerobic and resistive training based on initial evaluation findings, risk stratification, comorbidities and participant's personal goals.       Expected Outcomes Short Term: Increase workloads from initial exercise prescription for resistance, speed, and METs.;Short Term: Perform resistance training exercises routinely during rehab and add in resistance training at home;Long Term: Improve cardiorespiratory fitness, muscular endurance and strength as measured by increased METs and functional capacity ( )       Able to understand and use rate of perceived  exertion (RPE) scale Yes       Intervention Provide education and explanation on how to use RPE scale       Expected Outcomes Short Term: Able to use RPE daily in rehab to express subjective intensity level;Long Term:  Able to use RPE to guide intensity level when exercising independently       Knowledge and understanding of Target Heart Rate Range (THRR) Yes       Intervention Provide education and explanation of THRR including how the numbers were predicted and where they are located for reference       Expected Outcomes Short Term: Able to state/look up THRR;Long Term: Able to use THRR to govern intensity when exercising independently;Short Term: Able to use daily as guideline for intensity in rehab       Understanding of Exercise Prescription Yes       Intervention Provide education, explanation, and written materials on patient's individual exercise prescription       Expected Outcomes Short Term: Able to explain program exercise prescription;Long Term: Able to explain home exercise prescription to exercise independently          Exercise Goals Re-Evaluation :   Discharge  Exercise Prescription (Final Exercise Prescription Changes):   Nutrition:  Target Goals: Understanding of nutrition guidelines, daily intake of sodium 1500mg , cholesterol 200mg , calories 30% from fat and 7% or less from saturated fats, daily to have 5 or more servings of fruits and vegetables.  Biometrics:  Pre Biometrics - 03/29/24 1110       Pre Biometrics   Waist Circumference 45 inches    Hip Circumference 46.5 inches    Waist to Hip Ratio 0.97 %    Triceps Skinfold 22 mm    % Body Fat 33.5 %    Grip Strength 22 kg    Flexibility 0 in    Single Leg Stand 13.12 seconds           Nutrition Therapy Plan and Nutrition Goals:   Nutrition Assessments:  MEDIFICTS Score Key: >=70 Need to make dietary changes  40-70 Heart Healthy Diet <= 40 Therapeutic Level Cholesterol Diet    Picture Your Plate  Scores: <59 Unhealthy dietary pattern with much room for improvement. 41-50 Dietary pattern unlikely to meet recommendations for good health and room for improvement. 51-60 More healthful dietary pattern, with some room for improvement.  >60 Healthy dietary pattern, although there may be some specific behaviors that could be improved.    Nutrition Goals Re-Evaluation:   Nutrition Goals Re-Evaluation:   Nutrition Goals Discharge (Final Nutrition Goals Re-Evaluation):   Psychosocial: Target Goals: Acknowledge presence or absence of significant depression and/or stress, maximize coping skills, provide positive support system. Participant is able to verbalize types and ability to use techniques and skills needed for reducing stress and depression.  Initial Review & Psychosocial Screening:  Initial Psych Review & Screening - 03/29/24 1130       Initial Review   Current issues with --    Source of Stress Concerns --    Comments --      Barriers   Psychosocial barriers to participate in program --      Screening Interventions   Expected Outcomes --          Quality of Life Scores:  Quality of Life - 03/29/24 1343       Quality of Life   Select Quality of Life      Quality of Life Scores   Health/Function Pre 30 %    Socioeconomic Pre 30 %    Psych/Spiritual Pre 30 %    Family Pre 30 %    GLOBAL Pre 30 %         Scores of 19 and below usually indicate a poorer quality of life in these areas.  A difference of  2-3 points is a clinically meaningful difference.  A difference of 2-3 points in the total score of the Quality of Life Index has been associated with significant improvement in overall quality of life, self-image, physical symptoms, and general health in studies assessing change in quality of life.  PHQ-9: Review Flowsheet       03/29/2024  Depression screen PHQ 2/9  Decreased Interest 0  Down, Depressed, Hopeless 0  PHQ - 2 Score 0  Altered sleeping 0   Tired, decreased energy 0  Change in appetite 0  Feeling bad or failure about yourself  0  Trouble concentrating 0  Moving slowly or fidgety/restless 0  Suicidal thoughts 0  PHQ-9 Score 0   Interpretation of Total Score  Total Score Depression Severity:  1-4 = Minimal depression, 5-9 = Mild depression, 10-14 = Moderate depression, 15-19 = Moderately  severe depression, 20-27 = Severe depression   Psychosocial Evaluation and Intervention:   Psychosocial Re-Evaluation:   Psychosocial Discharge (Final Psychosocial Re-Evaluation):   Vocational Rehabilitation: Provide vocational rehab assistance to qualifying candidates.   Vocational Rehab Evaluation & Intervention:  Vocational Rehab - 03/29/24 1119       Initial Vocational Rehab Evaluation & Intervention   Assessment shows need for Vocational Rehabilitation No   back to work         Education: Education Goals: Education classes will be provided on a weekly basis, covering required topics. Participant will state understanding/return demonstration of topics presented.     Core Videos: Exercise    Move It!  Clinical staff conducted group or individual video education with verbal and written material and guidebook.  Patient learns the recommended Pritikin exercise program. Exercise with the goal of living a long, healthy life. Some of the health benefits of exercise include controlled diabetes, healthier blood pressure levels, improved cholesterol levels, improved heart and lung capacity, improved sleep, and better body composition. Everyone should speak with their doctor before starting or changing an exercise routine.  Biomechanical Limitations Clinical staff conducted group or individual video education with verbal and written material and guidebook.  Patient learns how biomechanical limitations can impact exercise and how we can mitigate and possibly overcome limitations to have an impactful and balanced exercise  routine.  Body Composition Clinical staff conducted group or individual video education with verbal and written material and guidebook.  Patient learns that body composition (ratio of muscle mass to fat mass) is a key component to assessing overall fitness, rather than body weight alone. Increased fat mass, especially visceral belly fat, can put us  at increased risk for metabolic syndrome, type 2 diabetes, heart disease, and even death. It is recommended to combine diet and exercise (cardiovascular and resistance training) to improve your body composition. Seek guidance from your physician and exercise physiologist before implementing an exercise routine.  Exercise Action Plan Clinical staff conducted group or individual video education with verbal and written material and guidebook.  Patient learns the recommended strategies to achieve and enjoy long-term exercise adherence, including variety, self-motivation, self-efficacy, and positive decision making. Benefits of exercise include fitness, good health, weight management, more energy, better sleep, less stress, and overall well-being.  Medical   Heart Disease Risk Reduction Clinical staff conducted group or individual video education with verbal and written material and guidebook.  Patient learns our heart is our most vital organ as it circulates oxygen, nutrients, white blood cells, and hormones throughout the entire body, and carries waste away. Data supports a plant-based eating plan like the Pritikin Program for its effectiveness in slowing progression of and reversing heart disease. The video provides a number of recommendations to address heart disease.   Metabolic Syndrome and Belly Fat  Clinical staff conducted group or individual video education with verbal and written material and guidebook.  Patient learns what metabolic syndrome is, how it leads to heart disease, and how one can reverse it and keep it from coming back. You have  metabolic syndrome if you have 3 of the following 5 criteria: abdominal obesity, high blood pressure, high triglycerides, low HDL cholesterol, and high blood sugar.  Hypertension and Heart Disease Clinical staff conducted group or individual video education with verbal and written material and guidebook.  Patient learns that high blood pressure, or hypertension, is very common in the [place] . Hypertension is largely due to excessive salt intake, but other important risk factors  include being overweight, physical inactivity, drinking too much alcohol , smoking, and not eating enough potassium from fruits and vegetables. High blood pressure is a leading risk factor for heart attack, stroke, congestive heart failure, dementia, kidney failure, and premature death. Long-term effects of excessive salt intake include stiffening of the arteries and thickening of heart muscle and organ damage. Recommendations include ways to reduce hypertension and the risk of heart disease.  Diseases of Our Time - Focusing on Diabetes Clinical staff conducted group or individual video education with verbal and written material and guidebook.  Patient learns why the best way to stop diseases of our time is prevention, through food and other lifestyle changes. Medicine (such as prescription pills and surgeries) is often only a Band-Aid on the problem, not a long-term solution. Most common diseases of our time include obesity, type 2 diabetes, hypertension, heart disease, and cancer. The Pritikin Program is recommended and has been proven to help reduce, reverse, and/or prevent the damaging effects of metabolic syndrome.  Nutrition   Overview of the Pritikin Eating Plan  Clinical staff conducted group or individual video education with verbal and written material and guidebook.  Patient learns about the Pritikin Eating Plan for disease risk reduction. The Pritikin Eating Plan emphasizes a wide variety of unrefined,  minimally-processed carbohydrates, like fruits, vegetables, whole grains, and legumes. Go, Caution, and Stop food choices are explained. Plant-based and lean animal proteins are emphasized. Rationale provided for low sodium intake for blood pressure control, low added sugars for blood sugar stabilization, and low added fats and oils for coronary artery disease risk reduction and weight management.  Calorie Density  Clinical staff conducted group or individual video education with verbal and written material and guidebook.  Patient learns about calorie density and how it impacts the Pritikin Eating Plan. Knowing the characteristics of the food you choose will help you decide whether those foods will lead to weight gain or weight loss, and whether you want to consume more or less of them. Weight loss is usually a side effect of the Pritikin Eating Plan because of its focus on low calorie-dense foods.  Label Reading  Clinical staff conducted group or individual video education with verbal and written material and guidebook.  Patient learns about the Pritikin recommended label reading guidelines and corresponding recommendations regarding calorie density, added sugars, sodium content, and whole grains.  Dining Out - Part 1  Clinical staff conducted group or individual video education with verbal and written material and guidebook.  Patient learns that restaurant meals can be sabotaging because they can be so high in calories, fat, sodium, and/or sugar. Patient learns recommended strategies on how to positively address this and avoid unhealthy pitfalls.  Facts on Fats  Clinical staff conducted group or individual video education with verbal and written material and guidebook.  Patient learns that lifestyle modifications can be just as effective, if not more so, as many medications for lowering your risk of heart disease. A Pritikin lifestyle can help to reduce your risk of inflammation and atherosclerosis  (cholesterol build-up, or plaque, in the artery walls). Lifestyle interventions such as dietary choices and physical activity address the cause of atherosclerosis. A review of the types of fats and their impact on blood cholesterol levels, along with dietary recommendations to reduce fat intake is also included.  Nutrition Action Plan  Clinical staff conducted group or individual video education with verbal and written material and guidebook.  Patient learns how to incorporate Pritikin recommendations into their lifestyle.  Recommendations include planning and keeping personal health goals in mind as an important part of their success.  Healthy Mind-Set    Healthy Minds, Bodies, Hearts  Clinical staff conducted group or individual video education with verbal and written material and guidebook.  Patient learns how to identify when they are stressed. Video will discuss the impact of that stress, as well as the many benefits of stress management. Patient will also be introduced to stress management techniques. The way we think, act, and feel has an impact on our hearts.  How Our Thoughts Can Heal Our Hearts  Clinical staff conducted group or individual video education with verbal and written material and guidebook.  Patient learns that negative thoughts can cause depression and anxiety. This can result in negative lifestyle behavior and serious health problems. Cognitive behavioral therapy is an effective method to help control our thoughts in order to change and improve our emotional outlook.  Additional Videos:  Exercise    Improving Performance  Clinical staff conducted group or individual video education with verbal and written material and guidebook.  Patient learns to use a non-linear approach by alternating intensity levels and lengths of time spent exercising to help burn more calories and lose more body fat. Cardiovascular exercise helps improve heart health, metabolism, hormonal balance,  blood sugar control, and recovery from fatigue. Resistance training improves strength, endurance, balance, coordination, reaction time, metabolism, and muscle mass. Flexibility exercise improves circulation, posture, and balance. Seek guidance from your physician and exercise physiologist before implementing an exercise routine and learn your capabilities and proper form for all exercise.  Introduction to Yoga  Clinical staff conducted group or individual video education with verbal and written material and guidebook.  Patient learns about yoga, a discipline of the coming together of mind, breath, and body. The benefits of yoga include improved flexibility, improved range of motion, better posture and core strength, increased lung function, weight loss, and positive self-image. Yoga's heart health benefits include lowered blood pressure, healthier heart rate, decreased cholesterol and triglyceride levels, improved immune function, and reduced stress. Seek guidance from your physician and exercise physiologist before implementing an exercise routine and learn your capabilities and proper form for all exercise.  Medical   Aging: Enhancing Your Quality of Life  Clinical staff conducted group or individual video education with verbal and written material and guidebook.  Patient learns key strategies and recommendations to stay in good physical health and enhance quality of life, such as prevention strategies, having an advocate, securing a Health Care Proxy and Power of Attorney, and keeping a list of medications and system for tracking them. It also discusses how to avoid risk for bone loss.  Biology of Weight Control  Clinical staff conducted group or individual video education with verbal and written material and guidebook.  Patient learns that weight gain occurs because we consume more calories than we burn (eating more, moving less). Even if your body weight is normal, you may have higher ratios of fat  compared to muscle mass. Too much body fat puts you at increased risk for cardiovascular disease, heart attack, stroke, type 2 diabetes, and obesity-related cancers. In addition to exercise, following the Pritikin Eating Plan can help reduce your risk.  Decoding Lab Results  Clinical staff conducted group or individual video education with verbal and written material and guidebook.  Patient learns that lab test reflects one measurement whose values change over time and are influenced by many factors, including medication, stress, sleep, exercise, food, hydration,  pre-existing medical conditions, and more. It is recommended to use the knowledge from this video to become more involved with your lab results and evaluate your numbers to speak with your doctor.   Diseases of Our Time - Overview  Clinical staff conducted group or individual video education with verbal and written material and guidebook.  Patient learns that according to the CDC, 50% to 70% of chronic diseases (such as obesity, type 2 diabetes, elevated lipids, hypertension, and heart disease) are avoidable through lifestyle improvements including healthier food choices, listening to satiety cues, and increased physical activity.  Sleep Disorders Clinical staff conducted group or individual video education with verbal and written material and guidebook.  Patient learns how good quality and duration of sleep are important to overall health and well-being. Patient also learns about sleep disorders and how they impact health along with recommendations to address them, including discussing with a physician.  Nutrition  Dining Out - Part 2 Clinical staff conducted group or individual video education with verbal and written material and guidebook.  Patient learns how to plan ahead and communicate in order to maximize their dining experience in a healthy and nutritious manner. Included are recommended food choices based on the type of restaurant  the patient is visiting.   Fueling a Banker conducted group or individual video education with verbal and written material and guidebook.  There is a strong connection between our food choices and our health. Diseases like obesity and type 2 diabetes are very prevalent and are in large-part due to lifestyle choices. The Pritikin Eating Plan provides plenty of food and hunger-curbing satisfaction. It is easy to follow, affordable, and helps reduce health risks.  Menu Workshop  Clinical staff conducted group or individual video education with verbal and written material and guidebook.  Patient learns that restaurant meals can sabotage health goals because they are often packed with calories, fat, sodium, and sugar. Recommendations include strategies to plan ahead and to communicate with the manager, chef, or server to help order a healthier meal.  Planning Your Eating Strategy  Clinical staff conducted group or individual video education with verbal and written material and guidebook.  Patient learns about the Pritikin Eating Plan and its benefit of reducing the risk of disease. The Pritikin Eating Plan does not focus on calories. Instead, it emphasizes high-quality, nutrient-rich foods. By knowing the characteristics of the foods, we choose, we can determine their calorie density and make informed decisions.  Targeting Your Nutrition Priorities  Clinical staff conducted group or individual video education with verbal and written material and guidebook.  Patient learns that lifestyle habits have a tremendous impact on disease risk and progression. This video provides eating and physical activity recommendations based on your personal health goals, such as reducing LDL cholesterol, losing weight, preventing or controlling type 2 diabetes, and reducing high blood pressure.  Vitamins and Minerals  Clinical staff conducted group or individual video education with verbal and written  material and guidebook.  Patient learns different ways to obtain key vitamins and minerals, including through a recommended healthy diet. It is important to discuss all supplements you take with your doctor.   Healthy Mind-Set    Smoking Cessation  Clinical staff conducted group or individual video education with verbal and written material and guidebook.  Patient learns that cigarette smoking and tobacco addiction pose a serious health risk which affects millions of people. Stopping smoking will significantly reduce the risk of heart disease, lung disease, and  many forms of cancer. Recommended strategies for quitting are covered, including working with your doctor to develop a successful plan.  Culinary   Becoming a Set Designer conducted group or individual video education with verbal and written material and guidebook.  Patient learns that cooking at home can be healthy, cost-effective, quick, and puts them in control. Keys to cooking healthy recipes will include looking at your recipe, assessing your equipment needs, planning ahead, making it simple, choosing cost-effective seasonal ingredients, and limiting the use of added fats, salts, and sugars.  Cooking - Breakfast and Snacks  Clinical staff conducted group or individual video education with verbal and written material and guidebook.  Patient learns how important breakfast is to satiety and nutrition through the entire day. Recommendations include key foods to eat during breakfast to help stabilize blood sugar levels and to prevent overeating at meals later in the day. Planning ahead is also a key component.  Cooking - Educational Psychologist conducted group or individual video education with verbal and written material and guidebook.  Patient learns eating strategies to improve overall health, including an approach to cook more at home. Recommendations include thinking of animal protein as a side on your plate  rather than center stage and focusing instead on lower calorie dense options like vegetables, fruits, whole grains, and plant-based proteins, such as beans. Making sauces in large quantities to freeze for later and leaving the skin on your vegetables are also recommended to maximize your experience.  Cooking - Healthy Salads and Dressing Clinical staff conducted group or individual video education with verbal and written material and guidebook.  Patient learns that vegetables, fruits, whole grains, and legumes are the foundations of the Pritikin Eating Plan. Recommendations include how to incorporate each of these in flavorful and healthy salads, and how to create homemade salad dressings. Proper handling of ingredients is also covered. Cooking - Soups and [employer] - Soups and Desserts Clinical staff conducted group or individual video education with verbal and written material and guidebook.  Patient learns that Pritikin soups and desserts make for easy, nutritious, and delicious snacks and meal components that are low in sodium, fat, sugar, and calorie density, while high in vitamins, minerals, and filling fiber. Recommendations include simple and healthy ideas for soups and desserts.   Overview     The Pritikin Solution Program Overview Clinical staff conducted group or individual video education with verbal and written material and guidebook.  Patient learns that the results of the Pritikin Program have been documented in more than 100 articles published in peer-reviewed journals, and the benefits include reducing risk factors for (and, in some cases, even reversing) high cholesterol, high blood pressure, type 2 diabetes, obesity, and more! An overview of the three key pillars of the Pritikin Program will be covered: eating well, doing regular exercise, and having a healthy mind-set.  WORKSHOPS  Exercise: Exercise Basics: Building Your Action Plan Clinical staff led group instruction  and group discussion with PowerPoint presentation and patient guidebook. To enhance the learning environment the use of posters, models and videos may be added. At the conclusion of this workshop, patients will comprehend the difference between physical activity and exercise, as well as the benefits of incorporating both, into their routine. Patients will understand the FITT (Frequency, Intensity, Time, and Type) principle and how to use it to build an exercise action plan. In addition, safety concerns and other considerations for exercise and cardiac rehab will  be addressed by the presenter. The purpose of this lesson is to promote a comprehensive and effective weekly exercise routine in order to improve patients' overall level of fitness.   Managing Heart Disease: Your Path to a Healthier Heart Clinical staff led group instruction and group discussion with PowerPoint presentation and patient guidebook. To enhance the learning environment the use of posters, models and videos may be added.At the conclusion of this workshop, patients will understand the anatomy and physiology of the heart. Additionally, they will understand how Pritikin's three pillars impact the risk factors, the progression, and the management of heart disease.  The purpose of this lesson is to provide a high-level overview of the heart, heart disease, and how the Pritikin lifestyle positively impacts risk factors.  Exercise Biomechanics Clinical staff led group instruction and group discussion with PowerPoint presentation and patient guidebook. To enhance the learning environment the use of posters, models and videos may be added. Patients will learn how the structural parts of their bodies function and how these functions impact their daily activities, movement, and exercise. Patients will learn how to promote a neutral spine, learn how to manage pain, and identify ways to improve their physical movement in order to  promote healthy living. The purpose of this lesson is to expose patients to common physical limitations that impact physical activity. Participants will learn practical ways to adapt and manage aches and pains, and to minimize their effect on regular exercise. Patients will learn how to maintain good posture while sitting, walking, and lifting.  Balance Training and Fall Prevention  Clinical staff led group instruction and group discussion with PowerPoint presentation and patient guidebook. To enhance the learning environment the use of posters, models and videos may be added. At the conclusion of this workshop, patients will understand the importance of their sensorimotor skills (vision, proprioception, and the vestibular system) in maintaining their ability to balance as they age. Patients will apply a variety of balancing exercises that are appropriate for their current level of function. Patients will understand the common causes for poor balance, possible solutions to these problems, and ways to modify their physical environment in order to minimize their fall risk. The purpose of this lesson is to teach patients about the importance of maintaining balance as they age and ways to minimize their risk of falling.  WORKSHOPS   Nutrition:  Fueling a Ship Broker led group instruction and group discussion with PowerPoint presentation and patient guidebook. To enhance the learning environment the use of posters, models and videos may be added. Patients will review the foundational principles of the Pritikin Eating Plan and understand what constitutes a serving size in each of the food groups. Patients will also learn Pritikin-friendly foods that are better choices when away from home and review make-ahead meal and snack options. Calorie density will be reviewed and applied to three nutrition priorities: weight maintenance, weight loss, and weight gain. The purpose of this lesson is to  reinforce (in a group setting) the key concepts around what patients are recommended to eat and how to apply these guidelines when away from home by planning and selecting Pritikin-friendly options. Patients will understand how calorie density may be adjusted for different weight management goals.  Mindful Eating  Clinical staff led group instruction and group discussion with PowerPoint presentation and patient guidebook. To enhance the learning environment the use of posters, models and videos may be added. Patients will briefly review the concepts of the Pritikin Eating Plan and  the importance of low-calorie dense foods. The concept of mindful eating will be introduced as well as the importance of paying attention to internal hunger signals. Triggers for non-hunger eating and techniques for dealing with triggers will be explored. The purpose of this lesson is to provide patients with the opportunity to review the basic principles of the Pritikin Eating Plan, discuss the value of eating mindfully and how to measure internal cues of hunger and fullness using the Hunger Scale. Patients will also discuss reasons for non-hunger eating and learn strategies to use for controlling emotional eating.  Targeting Your Nutrition Priorities Clinical staff led group instruction and group discussion with PowerPoint presentation and patient guidebook. To enhance the learning environment the use of posters, models and videos may be added. Patients will learn how to determine their genetic susceptibility to disease by reviewing their family history. Patients will gain insight into the importance of diet as part of an overall healthy lifestyle in mitigating the impact of genetics and other environmental insults. The purpose of this lesson is to provide patients with the opportunity to assess their personal nutrition priorities by looking at their family history, their own health history and current risk factors. Patients will  also be able to discuss ways of prioritizing and modifying the Pritikin Eating Plan for their highest risk areas  Menu  Clinical staff led group instruction and group discussion with PowerPoint presentation and patient guidebook. To enhance the learning environment the use of posters, models and videos may be added. Using menus brought in from [employer], or printed from [employer], patients will apply the Pritikin dining out guidelines that were presented in the [employer] video. Patients will also be able to practice these guidelines in a variety of provided scenarios. The purpose of this lesson is to provide patients with the opportunity to practice hands-on learning of the Pritikin Dining Out guidelines with actual menus and practice scenarios.  Label Reading Clinical staff led group instruction and group discussion with PowerPoint presentation and patient guidebook. To enhance the learning environment the use of posters, models and videos may be added. Patients will review and discuss the Pritikin label reading guidelines presented in Pritikin's Label Reading Educational series video. Using fool labels brought in from local grocery stores and markets, patients will apply the label reading guidelines and determine if the packaged food meet the Pritikin guidelines. The purpose of this lesson is to provide patients with the opportunity to review, discuss, and practice hands-on learning of the Pritikin Label Reading guidelines with actual packaged food labels. Cooking School  Pritikin's [employer] are designed to teach patients ways to prepare quick, simple, and affordable recipes at home. The importance of nutrition's role in chronic disease risk reduction is reflected in its emphasis in the overall Pritikin program. By learning how to prepare essential core Pritikin Eating Plan recipes, patients will increase control over what they eat; be able to customize the  flavor of foods without the use of added salt, sugar, or fat; and improve the quality of the food they consume. By learning a set of core recipes which are easily assembled, quickly prepared, and affordable, patients are more likely to prepare more healthy foods at home. These workshops focus on convenient breakfasts, simple entres, side dishes, and desserts which can be prepared with minimal effort and are consistent with nutrition recommendations for cardiovascular risk reduction. Cooking [employer] are taught by a armed forces logistics/support/administrative officer (RD) who has been trained  by the [employer] team. The chef or RD has a clear understanding of the importance of minimizing - if not completely eliminating - added fat, sugar, and sodium in recipes. Throughout the series of Cooking School Workshop sessions, patients will learn about healthy ingredients and efficient methods of cooking to build confidence in their capability to prepare    Cooking School weekly topics:  Adding Flavor- Sodium-Free  Fast and Healthy Breakfasts  Powerhouse Plant-Based Proteins  Satisfying Salads and Dressings  Simple Sides and Sauces  International Cuisine-Spotlight on the [employer] Zones  Delicious Desserts  Savory Soups  [employer] - Meals in a Astronomer Appetizers and Snacks  Comforting Weekend Breakfasts  One-Pot Wonders   Fast Evening Meals  Landscape Architect Your Pritikin Plate  WORKSHOPS   Healthy Mindset (Psychosocial):  Focused Goals, Sustainable Changes Clinical staff led group instruction and group discussion with PowerPoint presentation and patient guidebook. To enhance the learning environment the use of posters, models and videos may be added. Patients will be able to apply effective goal setting strategies to establish at least one personal goal, and then take consistent, meaningful action toward that goal. They will learn to identify common barriers to achieving  personal goals and develop strategies to overcome them. Patients will also gain an understanding of how our mind-set can impact our ability to achieve goals and the importance of cultivating a positive and growth-oriented mind-set. The purpose of this lesson is to provide patients with a deeper understanding of how to set and achieve personal goals, as well as the tools and strategies needed to overcome common obstacles which may arise along the way.  From Head to Heart: The Power of a Healthy Outlook  Clinical staff led group instruction and group discussion with PowerPoint presentation and patient guidebook. To enhance the learning environment the use of posters, models and videos may be added. Patients will be able to recognize and describe the impact of emotions and mood on physical health. They will discover the importance of self-care and explore self-care practices which may work for them. Patients will also learn how to utilize the 4 C's to cultivate a healthier outlook and better manage stress and challenges. The purpose of this lesson is to demonstrate to patients how a healthy outlook is an essential part of maintaining good health, especially as they continue their cardiac rehab journey.  Healthy Sleep for a Healthy Heart Clinical staff led group instruction and group discussion with PowerPoint presentation and patient guidebook. To enhance the learning environment the use of posters, models and videos may be added. At the conclusion of this workshop, patients will be able to demonstrate knowledge of the importance of sleep to overall health, well-being, and quality of life. They will understand the symptoms of, and treatments for, common sleep disorders. Patients will also be able to identify daytime and nighttime behaviors which impact sleep, and they will be able to apply these tools to help manage sleep-related challenges. The purpose of this lesson is to provide patients with a general  overview of sleep and outline the importance of quality sleep. Patients will learn about a few of the most common sleep disorders. Patients will also be introduced to the concept of "sleep hygiene," and discover ways to self-manage certain sleeping problems through simple daily behavior changes. Finally, the workshop will motivate patients by clarifying the links between quality sleep and their goals of heart-healthy living.   Recognizing and Reducing Stress Clinical staff led  group instruction and group discussion with PowerPoint presentation and patient guidebook. To enhance the learning environment the use of posters, models and videos may be added. At the conclusion of this workshop, patients will be able to understand the types of stress reactions, differentiate between acute and chronic stress, and recognize the impact that chronic stress has on their health. They will also be able to apply different coping mechanisms, such as reframing negative self-talk. Patients will have the opportunity to practice a variety of stress management techniques, such as deep abdominal breathing, progressive muscle relaxation, and/or guided imagery.  The purpose of this lesson is to educate patients on the role of stress in their lives and to provide healthy techniques for coping with it.  Learning Barriers/Preferences:  Learning Barriers/Preferences - 03/29/24 1344       Learning Barriers/Preferences   Learning Barriers Sight    Learning Preferences Audio;Computer/Internet;Group Instruction;Individual Instruction;Pictoral;Skilled Demonstration;Verbal Instruction;Video;Written Material          Education Topics:  Knowledge Questionnaire Score:  Knowledge Questionnaire Score - 03/29/24 1130       Knowledge Questionnaire Score   Pre Score 23/24          Core Components/Risk Factors/Patient Goals at Admission:  Personal Goals and Risk Factors at Admission - 03/29/24 1130       Core Components/Risk  Factors/Patient Goals on Admission    Weight Management Yes;Weight Loss;Obesity    Intervention Weight Management: Develop a combined nutrition and exercise program designed to reach desired caloric intake, while maintaining appropriate intake of nutrient and fiber, sodium and fats, and appropriate energy expenditure required for the weight goal.;Weight Management: Provide education and appropriate resources to help participant work on and attain dietary goals.;Weight Management/Obesity: Establish reasonable short term and long term weight goals.    Goal Weight: Long Term 225 lb (102.1 kg)    Hypertension Yes    Intervention Monitor prescription use compliance.;Provide education on lifestyle modifcations including regular physical activity/exercise, weight management, moderate sodium restriction and increased consumption of fresh fruit, vegetables, and low fat dairy, alcohol  moderation, and smoking cessation.    Expected Outcomes Short Term: Continued assessment and intervention until BP is < 140/21mm HG in hypertensive participants. < 130/82mm HG in hypertensive participants with diabetes, heart failure or chronic kidney disease.;Long Term: Maintenance of blood pressure at goal levels.    Lipids Yes    Intervention Provide education and support for participant on nutrition & aerobic/resistive exercise along with prescribed medications to achieve LDL 70mg , HDL >40mg .    Expected Outcomes Short Term: Participant states understanding of desired cholesterol values and is compliant with medications prescribed. Participant is following exercise prescription and nutrition guidelines.;Long Term: Cholesterol controlled with medications as prescribed, with individualized exercise RX and with personalized nutrition plan. Value goals: LDL < 70mg , HDL > 40 mg.    Stress Yes    Intervention Offer individual and/or small group education and counseling on adjustment to heart disease, stress management and  health-related lifestyle change. Teach and support self-help strategies.;Refer participants experiencing significant psychosocial distress to appropriate mental health specialists for further evaluation and treatment. When possible, include family members and significant others in education/counseling sessions.    Expected Outcomes Short Term: Participant demonstrates changes in health-related behavior, relaxation and other stress management skills, ability to obtain effective social support, and compliance with psychotropic medications if prescribed.;Long Term: Emotional wellbeing is indicated by absence of clinically significant psychosocial distress or social isolation.          Core  Components/Risk Factors/Patient Goals Review:    Core Components/Risk Factors/Patient Goals at Discharge (Final Review):    ITP Comments:  ITP Comments     Row Name 03/29/24 1056           ITP Comments Dr. Wilbert Bihari medical director. Introduction to pritikin education/intensive cardiac rehab. Initial orientation packet reviewed with patient.          Comments: Participant attended orientation for the cardiac rehabilitation program on  03/29/2024  to perform initial intake and exercise walk test. Patient introduced to the Pritikin Program education and orientation packet was reviewed. Completed 6-minute walk test, measurements, initial ITP, and exercise prescription. Vital signs stable. Telemetry-sinus bradycardiac/normal sinus rhythm, asymptomatic.   Service time was from 10:35 to 12:30.

## 2024-03-29 NOTE — Progress Notes (Addendum)
 Resting heart rate noted at 49 post 6 minute walk test at cardiac rehab orientation, nonsustained. Patient asymptomatic. Blood pressure 124/66. Oxygen saturation 97% on room air. Will forward today's ECG tracings to Katlyn West NP for review and continue to monitor the patient.Hadassah Elpidio Quan RN BSN

## 2024-04-02 ENCOUNTER — Encounter (HOSPITAL_COMMUNITY)
Admission: RE | Admit: 2024-04-02 | Discharge: 2024-04-02 | Disposition: A | Discharge status: Home / Self Care | Source: Ambulatory Visit | Attending: Cardiovascular Disease | Admitting: Cardiovascular Disease

## 2024-04-02 ENCOUNTER — Telehealth (HOSPITAL_COMMUNITY): Payer: Self-pay | Admitting: *Deleted

## 2024-04-02 DIAGNOSIS — Z951 Presence of aortocoronary bypass graft: Secondary | ICD-10-CM | POA: Diagnosis not present

## 2024-04-02 NOTE — Telephone Encounter (Signed)
-----   Message from Katlyn D [place] sent at 04/02/2024  4:26 PM EST ----- Thank you for letting me know!  We recently discontinued his amiodarone , so likely is still washing out of his system, he does remain on metoprolol .  If heart rate remains low in cardiac rehab or is symptomatic we can decrease his metoprolol  to 12.5. Thanks! Katlyn ----- Message ----- From: Cyrus Hadassah ORN, RN Sent: 03/29/2024   3:23 PM EST To: Katlyn D [place], NP  Good afternoon Katlyn, Mr Moure attended cardiac rehab orientation this morning and did well. I want to bring it to your attention that Resting heart rate noted at 49 post 6 minute walk test at cardiac rehab orientation, nonsustained. Patient asymptomatic. Blood pressure 124/66. Oxygen saturation 97% on room air. Please let me know if you have any further concerns or recommendations. I saw that he has a history of post op atrial fib.  I appreciate your input! Sincerely, [facility]  Cardiac Rehab

## 2024-04-02 NOTE — Progress Notes (Signed)
 Daily Session Note  Patient Details  Name: Jeffrey [name] MRN: [id number] Date of Birth: [birth year] Referring Provider:   Flowsheet Row CARDIAC REHAB PHASE II ORIENTATION from 03/29/2024 in [facility]  Referring Provider Lonni Cash, MD    Encounter Date: 04/02/2024  Check In:  Session Check In - 04/02/24 1438       Check-In   Supervising physician immediately available to respond to emergencies [facility] MD immediately available    Physician(s) Josefa Beauvais, NP    Location [facility]    Staff Present [name], MS, ACSM-CEP, CCRP, Exercise Physiologist;Olinty Valere, MS, ACSM-CEP, Exercise Physiologist;Marit Goodwill, RN, Valere Music, RN, BSN    Virtual Visit No    Medication changes reported     No    Fall or balance concerns reported    No    Tobacco Cessation No Change    Warm-up and Cool-down Performed as group-led instruction   CRP2 oreintation today   Resistance Training Performed Yes    VAD Patient? No    PAD/SET Patient? No      Pain Assessment   Currently in Pain? No/denies    Pain Score 0-No pain    Multiple Pain Sites No          Capillary Blood Glucose: No results found for this or any previous visit (from the past 24 hours).   Exercise Prescription Changes - 04/02/24 1512       Response to Exercise   Blood Pressure (Admit) 118/62    Blood Pressure (Exercise) 142/84    Blood Pressure (Exit) 128/70    Heart Rate (Admit) 61 bpm    Heart Rate (Exercise) 80 bpm    Heart Rate (Exit) 58 bpm    Rating of Perceived Exertion (Exercise) 9    Symptoms None    Comments Off to a good start with exercise.    Duration Continue with 30 min of aerobic exercise without signs/symptoms of physical distress.    Intensity THRR unchanged      Progression   Progression Continue to progress workloads to maintain intensity without signs/symptoms of physical distress.    Average  METs 1.8      Resistance Training   Training Prescription Yes    Weight 4 lbs    Reps 10-15    Time 5 Minutes      Interval Training   Interval Training No      Arm Ergometer   Level 2    Watts 16    RPM 52    Minutes 15    METs 1.7      Recumbant Elliptical   Level 2    RPM 44    Watts 66    Minutes 15    METs 1.9          Social History   Tobacco Use  Smoking Status Former   Types: Cigars   Quit date: 01/16/2016   Years since quitting: 8.2  Smokeless Tobacco Never  Tobacco Comments   02/26/2016 might smoke 1/month    Goals Met:  Exercise tolerated well No report of concerns or symptoms today Strength training completed today  Goals Unmet:  Not Applicable  Comments:Pt started cardiac rehab today.  Pt tolerated light exercise without difficulty. VSS, telemetry-Sinus Brady, sinus rhythm, asymptomatic.  Medication list reconciled. Pt denies barriers to medicaiton compliance.  PSYCHOSOCIAL ASSESSMENT:  PHQ-0. Pt exhibits positive coping skills, hopeful outlook with supportive family.  No psychosocial needs identified at this time, no psychosocial interventions necessary.    Pt enjoys golf.   Pt oriented to exercise equipment and routine.    Understanding verbalized.Hadassah Elpidio Quan RN BSN    Dr. Wilbert Bihari is Medical Director for Cardiac Rehab at [facility].

## 2024-04-04 ENCOUNTER — Encounter (HOSPITAL_COMMUNITY)
Admission: RE | Admit: 2024-04-04 | Discharge: 2024-04-04 | Disposition: A | Source: Ambulatory Visit | Attending: Cardiovascular Disease

## 2024-04-04 DIAGNOSIS — Z951 Presence of aortocoronary bypass graft: Secondary | ICD-10-CM | POA: Diagnosis not present

## 2024-04-06 ENCOUNTER — Encounter (HOSPITAL_COMMUNITY)
Admission: RE | Admit: 2024-04-06 | Discharge: 2024-04-06 | Disposition: A | Discharge status: Home / Self Care | Source: Ambulatory Visit | Attending: Cardiovascular Disease | Admitting: Cardiovascular Disease

## 2024-04-06 DIAGNOSIS — Z951 Presence of aortocoronary bypass graft: Secondary | ICD-10-CM | POA: Diagnosis not present

## 2024-04-09 ENCOUNTER — Encounter (HOSPITAL_COMMUNITY)

## 2024-04-11 ENCOUNTER — Encounter (HOSPITAL_COMMUNITY)

## 2024-04-13 ENCOUNTER — Encounter (HOSPITAL_COMMUNITY)

## 2024-04-16 ENCOUNTER — Encounter (HOSPITAL_COMMUNITY)

## 2024-04-16 ENCOUNTER — Telehealth: Payer: Self-pay | Admitting: Cardiovascular Disease

## 2024-04-16 NOTE — Telephone Encounter (Signed)
   Cardiac Monitor Alert  Date of alert:  04/16/2024   Patient Name: Jeffrey [name]  DOB: 05/05/1956  MRN: [id number]   [facility] Cardiologist: Lonni Cash, MD  [facility] EP:  None    Monitor Information: Cardiac Event Monitor [[employer]]  Reason:  eval for afib after d/c amiodarone  Ordering provider:  Cash MD[facility] NP   Alert Atrial Fibrillation/Flutter This is the 1st alert for this rhythm.  The patient has a hx of Paroxysmal Atrial Fibrillation/Flutter.    Anticoagulation medication as of 04/16/2024           apixaban  (ELIQUIS ) 5 MG TABS tablet Take 1 tablet (5 mg total) by mouth 2 (two) times daily.       Next Cardiology Appointment   Date:  05/03/2024  Provider:  Cash MD  Monitor strip from 04/11/24 indexed under media.   Attempted to call patient - left message  Other: Routed to MD  Loring Andriette HERO, RN  04/16/2024 2:37 PM

## 2024-04-16 NOTE — Telephone Encounter (Signed)
 Verlin Lonni BIRCH, MD to Me  Velinda Avelina CROME, RN (Selected Message)     04/16/24  2:48 PM Thanks. NO changes as of now. Will await final monitor. cdm

## 2024-04-17 NOTE — Telephone Encounter (Signed)
 Spoke with patient and shared monitor alert and recommendation from Dr. Verlin. Patient states at the time of the monitor alert he was walking on the treadmill with inclines. He denies any SOB, palpitations, dizziness, or chest pain. He is taking Eliquis , no missed doses.

## 2024-04-17 NOTE — Telephone Encounter (Signed)
 Pt returning call

## 2024-04-17 NOTE — Progress Notes (Signed)
 Cardiac Individual Treatment Plan  Patient Details  Name: Jeffrey [name] MRN: [id number] Date of Birth: 1955/09/29 Referring Provider:   Flowsheet Row CARDIAC REHAB PHASE II ORIENTATION from 03/29/2024 in 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 Swallow whole., Disp: , Rfl:    atorvastatin  (LIPITOR ) 80 MG tablet, Take 1 tablet (80 mg total) by mouth daily at 6 PM., Disp: 90 tablet, Rfl: 3   Glycerin-Hypromellose-PEG 400 (DRY EYE RELIEF DROPS OP), Place 1 drop into both eyes daily as needed (Dry eyes)., Disp: , Rfl:    Menthol, Topical Analgesic, (BIOFREEZE EX), Apply 1 Application topically daily as needed (pain)., Disp: , Rfl:    metoprolol  tartrate (LOPRESSOR ) 25 MG tablet, Take 1 tablet (25 mg total) by mouth 2 (two) times daily., Disp: 180 tablet, Rfl: 3   Multiple Vitamin (MULTIVITAMIN WITH MINERALS) TABS tablet, Take 1 tablet by mouth daily., Disp: , Rfl:    potassium chloride  SA (KLOR-CON  M) 20 MEQ tablet, Take 2 tablets (40 mEq total) by mouth once for 1 dose. Take after 12:00PM on 09/06 (Patient not taking: Reported on 03/29/2024), Disp: 2 tablet, Rfl: 0   sildenafil (REVATIO) 20 MG tablet, Take 40 mg by mouth daily as needed (ED)., Disp: , Rfl:    traMADol  (ULTRAM ) 50 MG tablet, Take 1 tablet (50 mg total) by mouth every 6 (six) hours as  needed for moderate pain (pain score 4-6)., Disp: 28 tablet, Rfl: 0 No current facility-administered medications for this encounter.  Facility-Administered Medications Ordered in Other Encounters:    [facility], Patient & Family Education, , Does not apply, Once, Shyrl Linnie KIDD, MD  Past Medical History: Past Medical History:  Diagnosis Date   Chest wall abscess 2012   lanced it & it's gone   Coronary artery disease    Elevated cholesterol    GERD (gastroesophageal reflux disease)     Tobacco Use: Social History   Tobacco Use  Smoking Status Former   Types: Cigars   Quit date: 01/16/2016   Years since quitting: 8.2  Smokeless Tobacco Never  Tobacco Comments   02/26/2016 might smoke 1/month    Labs: Review Flowsheet  More data may exist      Latest Ref Rng & Units 07/20/2016 08/17/2017 10/25/2018 01/11/2024 01/12/2024  Labs for ITP Cardiac and Pulmonary Rehab  Cholestrol 100 - 199 mg/dL 890  886  888  - -  LDL (calc) 0 - 99 mg/dL 59  61  54  - -  HDL-C >39 mg/dL 41  43  39  - -  Trlycerides 0 - 149 mg/dL 47  47  91  - -  Hemoglobin A1c 4.8 - 5.6 % - - 5.0  5.1  -  PH, Arterial 7.35 - 7.45 - - - - 7.337  7.270  7.302  7.374  7.363  7.378  7.377   PCO2 arterial 32 - 48 mmHg - - - - 38.5  43.8  37.8  38.3  37.4  38.6  36.0   Bicarbonate 20.0 - 28.0 mmol/L - - - - 20.6  20.2  18.8  22.5  21.3  23.0  22.7  21.2   TCO2 22 - 32 mmol/L - - - - 22  22  20  24  23  22  26   33  24  24  23  21  22    Acid-base deficit 0.0 - 2.0 mmol/L - - - - 5.0  7.0  7.0  3.0  4.0  3.0  2.0  4.0   O2 Saturation % - - - - 94  97  98  98  100  81  100  100     Details       Multiple values from one day are sorted in reverse-chronological order          Exercise Target Goals: Exercise Program Goal: Individual exercise prescription set using results from initial 6 min walk test and THRR while considering  patient's activity barriers and safety.   Exercise Prescription  Goal: Initial exercise prescription builds to 30-45 minutes a day of aerobic activity, 2-3 days per week.  Home exercise guidelines will be given to patient during program as part of exercise prescription that the participant will acknowledge.   Education: Aerobic Exercise: - Group verbal and visual presentation on the components of exercise prescription. Introduces F.I.T.T principle from ACSM for exercise prescriptions.  Reviews F.I.T.T. principles of aerobic exercise including progression. Written material provided at class time.   Education: Resistance Exercise: - Group verbal and visual presentation on the components of exercise prescription. Introduces F.I.T.T principle from ACSM for exercise prescriptions  Reviews F.I.T.T. principles of resistance exercise including progression. Written material provided at class time.    Education: Exercise & Equipment Safety: - Individual verbal instruction and demonstration of equipment use and safety with use of the equipment.   Education: Exercise Physiology & General Exercise Guidelines: - Group verbal and written instruction with models to review the exercise physiology of the cardiovascular system and associated critical values. Provides general exercise guidelines with specific guidelines to those with heart or lung disease. Written material provided at class time.   Education: Flexibility, Balance, Mind/Body Relaxation: - Group verbal and visual presentation with interactive activity on the components of exercise prescription. Introduces F.I.T.T principle from ACSM for exercise prescriptions. Reviews F.I.T.T. principles of flexibility and balance exercise training including progression. Also discusses the mind body connection.  Reviews various relaxation techniques to help reduce and manage stress (i.e. Deep breathing, progressive muscle relaxation, and visualization). Balance handout provided to take home. Written material provided at class  time.   Activity Barriers & Risk Stratification:  Activity Barriers & Cardiac Risk Stratification - 03/29/24 1112       Activity Barriers & Cardiac Risk Stratification   Activity Barriers History of Falls;Balance Concerns;Joint Problems    Cardiac Risk Stratification High          6 Minute Walk:  6 Minute Walk     Row Name 03/29/24 1344         6 Minute Walk   Phase Initial     Distance 1700 feet     Walk Time 6 minutes     # of Rest Breaks 0     MPH 3.22  METS 3.18     RPE 7     Perceived Dyspnea  0     VO2 Peak 11.1     Symptoms No     Resting HR 51 bpm     Resting BP 110/70     Resting Oxygen Saturation  96 %     Exercise Oxygen Saturation  during 6 min walk 97 %     Max Ex. HR 90 bpm     Max Ex. BP 136/76     2 Minute Post BP 124/66        Oxygen Initial Assessment:   Oxygen Re-Evaluation:   Oxygen Discharge (Final Oxygen Re-Evaluation):   Initial Exercise Prescription:  Initial Exercise Prescription - 03/29/24 1100       Date of Initial Exercise RX and Referring Provider   Date 03/29/24    Referring Provider Lonni Cash, MD    Expected Discharge Date 06/20/24      Arm Ergometer   Level 2    Watts 60    RPM 60    Minutes 15    METs 3.18      Recumbant Elliptical   Level 2    RPM 60    Watts 75    Minutes 15    METs 3.18      Prescription Details   Frequency (times per week) 3    Duration Progress to 30 minutes of continuous aerobic without signs/symptoms of physical distress      Intensity   THRR 40-80% of Max Heartrate 60-120    Ratings of Perceived Exertion 11-13    Perceived Dyspnea 0-4      Progression   Progression Continue progressive overload as per policy without signs/symptoms or physical distress.      Resistance Training   Training Prescription Yes    Weight 4 lbs    Reps 10-15          Perform Capillary Blood Glucose checks as needed.  Exercise Prescription Changes:   Exercise Prescription  Changes     Row Name 04/02/24 1512             Response to Exercise   Blood Pressure (Admit) 118/62       Blood Pressure (Exercise) 142/84       Blood Pressure (Exit) 128/70       Heart Rate (Admit) 61 bpm       Heart Rate (Exercise) 80 bpm       Heart Rate (Exit) 58 bpm       Rating of Perceived Exertion (Exercise) 9       Symptoms None       Comments Off to a good start with exercise.       Duration Continue with 30 min of aerobic exercise without signs/symptoms of physical distress.       Intensity THRR unchanged         Progression   Progression Continue to progress workloads to maintain intensity without signs/symptoms of physical distress.       Average METs 1.8         Resistance Training   Training Prescription Yes       Weight 4 lbs       Reps 10-15       Time 5 Minutes         Interval Training   Interval Training No         Arm Ergometer   Level 2  Watts 16       RPM 52       Minutes 15       METs 1.7         Recumbant Elliptical   Level 2       RPM 44       Watts 66       Minutes 15       METs 1.9          Exercise Comments:   Exercise Comments     Row Name 04/02/24 1601 04/16/24 1737         Exercise Comments Peterson tolerated first session of exercise well without symptoms. Oriented him to the exercise equipment and stretching routine. Patient has inconsistent attendance. Absent since 11/21. Will review education when attendance becomes more regular         Exercise Goals and Review:   Exercise Goals     Row Name 03/29/24 1111             Exercise Goals   Increase Physical Activity Yes       Intervention Provide advice, education, support and counseling about physical activity/exercise needs.;Develop an individualized exercise prescription for aerobic and resistive training based on initial evaluation findings, risk stratification, comorbidities and participant's personal goals.       Expected Outcomes Short Term: Attend  rehab on a regular basis to increase amount of physical activity.;Long Term: Add in home exercise to make exercise part of routine and to increase amount of physical activity.;Long Term: Exercising regularly at least 3-5 days a week.       Increase Strength and Stamina Yes       Intervention Provide advice, education, support and counseling about physical activity/exercise needs.;Develop an individualized exercise prescription for aerobic and resistive training based on initial evaluation findings, risk stratification, comorbidities and participant's personal goals.       Expected Outcomes Short Term: Increase workloads from initial exercise prescription for resistance, speed, and METs.;Short Term: Perform resistance training exercises routinely during rehab and add in resistance training at home;Long Term: Improve cardiorespiratory fitness, muscular endurance and strength as measured by increased METs and functional capacity ( )       Able to understand and use rate of perceived exertion (RPE) scale Yes       Intervention Provide education and explanation on how to use RPE scale       Expected Outcomes Short Term: Able to use RPE daily in rehab to express subjective intensity level;Long Term:  Able to use RPE to guide intensity level when exercising independently       Knowledge and understanding of Target Heart Rate Range (THRR) Yes       Intervention Provide education and explanation of THRR including how the numbers were predicted and where they are located for reference       Expected Outcomes Short Term: Able to state/look up THRR;Long Term: Able to use THRR to govern intensity when exercising independently;Short Term: Able to use daily as guideline for intensity in rehab       Understanding of Exercise Prescription Yes       Intervention Provide education, explanation, and written materials on patient's individual exercise prescription       Expected Outcomes Short Term: Able to explain program  exercise prescription;Long Term: Able to explain home exercise prescription to exercise independently          Exercise Goals Re-Evaluation :  Exercise Goals Re-Evaluation  Row Name 04/02/24 1601             Exercise Goal Re-Evaluation   Exercise Goals Review Increase Physical Activity;Increase Strength and Stamina;Able to understand and use rate of perceived exertion (RPE) scale       Comments Takeru was able to understand and use RPE scale appropriately.       Expected Outcomes Progress workloads as tolerated to help increase strength and stamina.          Discharge Exercise Prescription (Final Exercise Prescription Changes):  Exercise Prescription Changes - 04/02/24 1512       Response to Exercise   Blood Pressure (Admit) 118/62    Blood Pressure (Exercise) 142/84    Blood Pressure (Exit) 128/70    Heart Rate (Admit) 61 bpm    Heart Rate (Exercise) 80 bpm    Heart Rate (Exit) 58 bpm    Rating of Perceived Exertion (Exercise) 9    Symptoms None    Comments Off to a good start with exercise.    Duration Continue with 30 min of aerobic exercise without signs/symptoms of physical distress.    Intensity THRR unchanged      Progression   Progression Continue to progress workloads to maintain intensity without signs/symptoms of physical distress.    Average METs 1.8      Resistance Training   Training Prescription Yes    Weight 4 lbs    Reps 10-15    Time 5 Minutes      Interval Training   Interval Training No      Arm Ergometer   Level 2    Watts 16    RPM 52    Minutes 15    METs 1.7      Recumbant Elliptical   Level 2    RPM 44    Watts 66    Minutes 15    METs 1.9          Nutrition:  Target Goals: Understanding of nutrition guidelines, daily intake of sodium 1500mg , cholesterol 200mg , calories 30% from fat and 7% or less from saturated fats, daily to have 5 or more servings of fruits and vegetables.  Education: Nutrition 1 -Group  instruction provided by verbal, written material, interactive activities, discussions, models, and posters to present general guidelines for heart healthy nutrition including macronutrients, label reading, and promoting whole foods over processed counterparts. Education serves as pensions consultant of discussion of heart healthy eating for all. Written material provided at class time.    Education: Nutrition 2 -Group instruction provided by verbal, written material, interactive activities, discussions, models, and posters to present general guidelines for heart healthy nutrition including sodium, cholesterol, and saturated fat. Providing guidance of habit forming to improve blood pressure, cholesterol, and body weight. Written material provided at class time.     Biometrics:  Pre Biometrics - 03/29/24 1110       Pre Biometrics   Waist Circumference 45 inches    Hip Circumference 46.5 inches    Waist to Hip Ratio 0.97 %    Triceps Skinfold 22 mm    % Body Fat 33.5 %    Grip Strength 22 kg    Flexibility 0 in    Single Leg Stand 13.12 seconds           Nutrition Therapy Plan and Nutrition Goals:  Nutrition Therapy & Goals - 04/02/24 1526       Nutrition Therapy   Diet Heart Healthy    Drug/Food Interactions  Statins/Certain Fruits      Personal Nutrition Goals   Nutrition Goal Patient to improve diet quality by using the plate method as a guide for meal planning to include lean protein/plant protein, fruits, vegetables, whole grains, nonfat dairy as part of a well-balanced diet.    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 Pt reports making healthy changes to diet, particularly limiting/avoiding fried food. RD provided additional information regarding heart healthy diet with emphasis on opting for plant-based foods to fill majority of plate. Encouraged pt to continue opting for lean sources of protein such as baked/grilled  skinless poultry, fish.      Intervention Plan   Intervention Prescribe, educate and counsel regarding individualized specific dietary modifications aiming towards targeted core components such as weight, hypertension, lipid management, diabetes, heart failure and other comorbidities.;Nutrition handout(s) given to patient.   Heart Healthy Nutrition Therapy   Expected Outcomes Short Term Goal: Understand basic principles of dietary content, such as calories, fat, sodium, cholesterol and nutrients.;Long Term Goal: Adherence to prescribed nutrition plan.          Nutrition Assessments:  MEDIFICTS Score Key: >=70 Need to make dietary changes  40-70 Heart Healthy Diet <= 40 Therapeutic Level Cholesterol Diet   Picture Your Plate Scores: <59 Unhealthy dietary pattern with much room for improvement. 41-50 Dietary pattern unlikely to meet recommendations for good health and room for improvement. 51-60 More healthful dietary pattern, with some room for improvement.  >60 Healthy dietary pattern, although there may be some specific behaviors that could be improved.    Nutrition Goals Re-Evaluation:   Nutrition Goals Discharge (Final Nutrition Goals Re-Evaluation):   Psychosocial: Target Goals: Acknowledge presence or absence of significant depression and/or stress, maximize coping skills, provide positive support system. Participant is able to verbalize types and ability to use techniques and skills needed for reducing stress and depression.   Education: Stress, Anxiety, and Depression - Group verbal and visual presentation to define topics covered.  Reviews how body is impacted by stress, anxiety, and depression.  Also discusses healthy ways to reduce stress and to treat/manage anxiety and depression. Written material provided at class time.   Education: Sleep Hygiene -Provides group verbal and written instruction about how sleep can affect your health.  Define sleep hygiene, discuss  sleep cycles and impact of sleep habits. Review good sleep hygiene tips.   Initial Review & Psychosocial Screening:  Initial Psych Review & Screening - 03/29/24 1130       Initial Review   Current issues with --    Source of Stress Concerns --    Comments --      Barriers   Psychosocial barriers to participate in program --      Screening Interventions   Expected Outcomes --          Quality of Life Scores:   Quality of Life - 03/29/24 1343       Quality of Life   Select Quality of Life      Quality of Life Scores   Health/Function Pre 30 %    Socioeconomic Pre 30 %    Psych/Spiritual Pre 30 %    Family Pre 30 %    GLOBAL Pre 30 %         Scores of 19 and below usually indicate a poorer quality of life in these areas.  A difference of  2-3 points is a clinically meaningful difference.  A difference of 2-3 points in the total score of the Quality of Life Index has been associated with significant improvement in overall quality of life, self-image, physical symptoms, and general health in studies assessing change in quality of life.  PHQ-9: Review Flowsheet       03/29/2024  Depression screen PHQ 2/9  Decreased Interest 0  Down, Depressed, Hopeless 0  PHQ - 2 Score 0  Altered sleeping 0  Tired, decreased energy 0  Change in appetite 0  Feeling bad or failure about yourself  0  Trouble concentrating 0  Moving slowly or fidgety/restless 0  Suicidal thoughts 0  PHQ-9 Score 0   Interpretation of Total Score  Total Score Depression Severity:  1-4 = Minimal depression, 5-9 = Mild depression, 10-14 = Moderate depression, 15-19 = Moderately severe depression, 20-27 = Severe depression   Psychosocial Evaluation and Intervention:   Psychosocial Re-Evaluation:  Psychosocial Re-Evaluation     Row Name 04/04/24 1704 04/17/24 1618           Psychosocial Re-Evaluation   Current issues with None Identified Current Stress Concerns      Comments -- Lukah has  voiced no additional psychosocial concerns/stressors during exercise at cardiac rehab since his orientation (03/29/24).      Expected Outcomes -- Raymie will voice controlled or decreased psychosocial concerns/stressors by completion of cardiac rehab.      Interventions Encouraged to attend Cardiac Rehabilitation for the exercise Encouraged to attend Cardiac Rehabilitation for the exercise      Continue Psychosocial Services  No Follow up required No Follow up required         Psychosocial Discharge (Final Psychosocial Re-Evaluation):  Psychosocial Re-Evaluation - 04/17/24 1618       Psychosocial Re-Evaluation   Current issues with Current Stress Concerns    Comments Usbaldo has voiced no additional psychosocial concerns/stressors during exercise at cardiac rehab since his orientation (03/29/24).    Expected Outcomes Hampton will voice controlled or decreased psychosocial concerns/stressors by completion of cardiac rehab.    Interventions Encouraged to attend Cardiac Rehabilitation for the exercise    Continue Psychosocial Services  No Follow up required          Vocational Rehabilitation: Provide vocational rehab assistance to qualifying candidates.   Vocational Rehab Evaluation & Intervention:  Vocational Rehab - 03/29/24 1119       Initial Vocational Rehab Evaluation & Intervention   Assessment shows need for Vocational Rehabilitation No   back to work         Education: Education Goals: Education classes will be provided on a variety of topics geared toward better understanding of heart health and risk factor modification. Participant will state understanding/return demonstration of topics presented as noted by education test scores.  Learning Barriers/Preferences:  Learning Barriers/Preferences - 03/29/24 1344       Learning Barriers/Preferences   Learning Barriers Sight    Learning Preferences Audio;Computer/Internet;Group Instruction;Individual  Instruction;Pictoral;Skilled Demonstration;Verbal Instruction;Video;Written Material          General Cardiac Education Topics:  AED/CPR: - Group verbal and written instruction with the use of models to demonstrate the basic use of the AED with the basic ABC's of resuscitation.   Test and Procedures: - Group verbal and visual presentation and models provide information about basic cardiac anatomy and function. Reviews the testing methods done to diagnose heart disease and the outcomes of the test results. Describes the treatment choices: Medical Management, Angioplasty, or Coronary Bypass Surgery for treating various heart conditions  including Myocardial Infarction, Angina, Valve Disease, and Cardiac Arrhythmias. Written material provided at class time.   Medication Safety: - Group verbal and visual instruction to review commonly prescribed medications for heart and lung disease. Reviews the medication, class of the drug, and side effects. Includes the steps to properly store meds and maintain the prescription regimen. Written material provided at class time.   Intimacy: - Group verbal instruction through game format to discuss how heart and lung disease can affect sexual intimacy. Written material provided at class time.   Know Your Numbers and Heart Failure: - Group verbal and visual instruction to discuss disease risk factors for cardiac and pulmonary disease and treatment options.  Reviews associated critical values for Overweight/Obesity, Hypertension, Cholesterol, and Diabetes.  Discusses basics of heart failure: signs/symptoms and treatments.  Introduces Heart Failure Zone chart for action plan for heart failure. Written material provided at class time.   Infection Prevention: - Provides verbal and written material to individual with discussion of infection control including proper hand washing and proper equipment cleaning during exercise session.   Falls Prevention: -  Provides verbal and written material to individual with discussion of falls prevention and safety.   Other: -Provides group and verbal instruction on various topics (see comments)   Knowledge Questionnaire Score:  Knowledge Questionnaire Score - 03/29/24 1130       Knowledge Questionnaire Score   Pre Score 23/24          Core Components/Risk Factors/Patient Goals at Admission:  Personal Goals and Risk Factors at Admission - 03/29/24 1130       Core Components/Risk Factors/Patient Goals on Admission    Weight Management Yes;Weight Loss;Obesity    Intervention Weight Management: Develop a combined nutrition and exercise program designed to reach desired caloric intake, while maintaining appropriate intake of nutrient and fiber, sodium and fats, and appropriate energy expenditure required for the weight goal.;Weight Management: Provide education and appropriate resources to help participant work on and attain dietary goals.;Weight Management/Obesity: Establish reasonable short term and long term weight goals.    Goal Weight: Long Term 225 lb (102.1 kg)    Hypertension Yes    Intervention Monitor prescription use compliance.;Provide education on lifestyle modifcations including regular physical activity/exercise, weight management, moderate sodium restriction and increased consumption of fresh fruit, vegetables, and low fat dairy, alcohol  moderation, and smoking cessation.    Expected Outcomes Short Term: Continued assessment and intervention until BP is < 140/29mm HG in hypertensive participants. < 130/62mm HG in hypertensive participants with diabetes, heart failure or chronic kidney disease.;Long Term: Maintenance of blood pressure at goal levels.    Lipids Yes    Intervention Provide education and support for participant on nutrition & aerobic/resistive exercise along with prescribed medications to achieve LDL 70mg , HDL >40mg .    Expected Outcomes Short Term: Participant states  understanding of desired cholesterol values and is compliant with medications prescribed. Participant is following exercise prescription and nutrition guidelines.;Long Term: Cholesterol controlled with medications as prescribed, with individualized exercise RX and with personalized nutrition plan. Value goals: LDL < 70mg , HDL > 40 mg.    Stress Yes    Intervention Offer individual and/or small group education and counseling on adjustment to heart disease, stress management and health-related lifestyle change. Teach and support self-help strategies.;Refer participants experiencing significant psychosocial distress to appropriate mental health specialists for further evaluation and treatment. When possible, include family members and significant others in education/counseling sessions.    Expected Outcomes Short Term: Participant demonstrates changes in  health-related behavior, relaxation and other stress management skills, ability to obtain effective social support, and compliance with psychotropic medications if prescribed.;Long Term: Emotional wellbeing is indicated by absence of clinically significant psychosocial distress or social isolation.          Education:Diabetes - Individual verbal and written instruction to review signs/symptoms of diabetes, desired ranges of glucose level fasting, after meals and with exercise. Acknowledge that pre and post exercise glucose checks will be done for 3 sessions at entry of program.   Core Components/Risk Factors/Patient Goals Review:   Goals and Risk Factor Review     Row Name 04/04/24 1705 04/17/24 1620           Core Components/Risk Factors/Patient Goals Review   Personal Goals Review Weight Management/Obesity;Hypertension;Lipids;Stress Weight Management/Obesity;Hypertension;Lipids;Stress      Review Gibril started cardiac rehab on 04/02/24. Marce did well with exercise. Vital signs were stable. Alvino started cardiac rehab on 04/02/24 and has  attended one additional CR session since.  Vital signs stable.      Expected Outcomes Nekota will continue to participate in cardiac rehab for exercise, nutrition and lifestyle modifications Arias will continue to participate in cardiac rehab for exercise, nutrition and lifestyle modifications         Core Components/Risk Factors/Patient Goals at Discharge (Final Review):   Goals and Risk Factor Review - 04/17/24 1620       Core Components/Risk Factors/Patient Goals Review   Personal Goals Review Weight Management/Obesity;Hypertension;Lipids;Stress    Review Delos started cardiac rehab on 04/02/24 and has attended one additional CR session since.  Vital signs stable.    Expected Outcomes Kiel will continue to participate in cardiac rehab for exercise, nutrition and lifestyle modifications          ITP Comments:  ITP Comments     Row Name 03/29/24 1056 04/04/24 1703 04/17/24 1617       ITP Comments Dr. Wilbert Bihari medical director. Introduction to pritikin education/intensive cardiac rehab. Initial orientation packet reviewed with patient. 30 Day ITP Review. Khaza started cardiac rehab on 04/02/24. Andrick did well with exercise. 30 Day ITP Review. Mihcael started cardiac rehab on 04/02/24 and has attended one CR session since.        Comments: see ITP comments

## 2024-04-18 ENCOUNTER — Encounter (HOSPITAL_COMMUNITY)

## 2024-04-20 ENCOUNTER — Encounter (HOSPITAL_COMMUNITY)

## 2024-04-23 ENCOUNTER — Telehealth (HOSPITAL_COMMUNITY): Payer: Self-pay

## 2024-04-23 ENCOUNTER — Encounter (HOSPITAL_COMMUNITY)

## 2024-04-23 NOTE — Telephone Encounter (Signed)
 Called patient regarding 3x no call, no shows in a row. Patient states he was out of town last week and forgot to come in today, states he will be in Wednesday.

## 2024-04-25 ENCOUNTER — Encounter (HOSPITAL_COMMUNITY)
Admission: RE | Admit: 2024-04-25 | Discharge: 2024-04-25 | Disposition: A | Discharge status: Home / Self Care | Source: Ambulatory Visit | Attending: Cardiovascular Disease | Admitting: Cardiovascular Disease

## 2024-04-25 DIAGNOSIS — Z951 Presence of aortocoronary bypass graft: Secondary | ICD-10-CM | POA: Diagnosis not present

## 2024-04-27 ENCOUNTER — Encounter (HOSPITAL_COMMUNITY)

## 2024-04-27 ENCOUNTER — Ambulatory Visit: Attending: Cardiology

## 2024-04-27 DIAGNOSIS — I480 Paroxysmal atrial fibrillation: Secondary | ICD-10-CM | POA: Diagnosis not present

## 2024-04-29 ENCOUNTER — Ambulatory Visit: Payer: Self-pay | Admitting: Cardiology

## 2024-04-30 ENCOUNTER — Encounter (HOSPITAL_COMMUNITY)

## 2024-05-02 ENCOUNTER — Encounter (HOSPITAL_COMMUNITY): Admission: RE | Admit: 2024-05-02 | Discharge: 2024-05-02 | Attending: Cardiovascular Disease

## 2024-05-02 DIAGNOSIS — Z951 Presence of aortocoronary bypass graft: Secondary | ICD-10-CM

## 2024-05-02 NOTE — Progress Notes (Unsigned)
 No chief complaint [name] file.  History of Present Illness: 68 yo male with history of CAD s/p CABG, Jeffrey [name], Jeffrey and post-op atrial fibrillation who is here today for follow up. *** Note from here ***hyperlipidemia who is here today for follow up. He has been followed in our office by Dr. Claudene. 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 Also with moderate disease in the ostium of the obtuse marginal branch and diffuse moderate disease in the LAD and RCA. Nuclear stress test in September 2022 with no ischemia.   He is here today for follow up. The patient denies any chest [name], Jeffrey [name], Jeffrey [name], Jeffrey [name], Jeffrey [name], Jeffrey [name], Jeffrey [name], Jeffrey syncope or syncope.   Primary Care Physician: Ransom Other, MD   Past Medical History:  Diagnosis Date   Chest wall abscess 2012   lanced it & it's gone   Coronary artery disease    Elevated cholesterol    GERD (gastroesophageal reflux disease)     Past Surgical History:  Procedure Laterality Date   CARDIAC CATHETERIZATION N/A 02/26/2016   Procedure: Left Heart Cath and Coronary Angiography;  Surgeon: Victory LELON Claudene, MD;  Location: [facility] INVASIVE CV LAB;  Service: Cardiovascular;  Laterality: N/A;   CARDIAC CATHETERIZATION N/A 02/26/2016   Procedure: Coronary Stent Intervention;  Surgeon: Victory LELON Claudene, MD;  Location: [facility] INVASIVE CV LAB;  Service: Cardiovascular;  Laterality: N/A;   COLONOSCOPY  2007   COLONOSCOPY WITH PROPOFOL  N/A 02/10/2016   Procedure: COLONOSCOPY WITH PROPOFOL ;  Surgeon: Gladis MARLA Louder, MD;  Location: [facility] ENDOSCOPY;  Service: Endoscopy;  Laterality: N/A;   CORONARY ANGIOPLASTY WITH STENT PLACEMENT  02/26/2016   CORONARY ARTERY BYPASS GRAFT N/A 01/12/2024   Procedure: CORONARY ARTERY BYPASS GRAFTING (CABG) TIMES FIVE USING LEFT INTERNAL MAMMARY ARTERY AND ENDOSCOPICALLY HARVESTED RIGHT GREATER SAPHENOUS VEINS.;  Surgeon: Shyrl Linnie KIDD, MD;  Location: [facility] OR;  Service: Open Heart Surgery;  Laterality: N/A;   LEFT HEART CATH AND CORONARY ANGIOGRAPHY N/A 12/15/2023   Procedure: LEFT HEART CATH AND CORONARY ANGIOGRAPHY;  Surgeon: Verlin Lonni BIRCH, MD;  Location: [facility] INVASIVE CV LAB;  Service: Cardiovascular;  Laterality: N/A;   TEE WITHOUT CARDIOVERSION N/A 01/12/2024   Procedure: ECHOCARDIOGRAM, TRANSESOPHAGEAL;  Surgeon: Shyrl Linnie KIDD, MD;  Location: [facility] OR;  Service: Open Heart Surgery;  Laterality: N/A;    Current Outpatient Medications  Medication Sig Dispense Refill   amLODipine  (NORVASC ) 10 MG tablet Take 1 tablet (10 mg total) by mouth daily. 90 tablet 3   apixaban  (ELIQUIS ) 5 MG TABS tablet Take 1 tablet (5 mg total) by mouth 2 (two) times daily. 60 tablet 11   aspirin  EC 81 MG tablet Take 81 mg by mouth daily. Swallow whole.     atorvastatin  (LIPITOR ) 80 MG tablet Take 1 tablet (80 mg total) by mouth daily at 6 PM. 90 tablet 3   Glycerin-Hypromellose-PEG 400 (DRY EYE RELIEF DROPS OP) Place 1 drop into both eyes daily as needed (Dry eyes).     Menthol, Topical Analgesic, (BIOFREEZE EX) Apply 1 Application topically daily as needed ([name]).     metoprolol  tartrate (LOPRESSOR ) 25 MG tablet Take 1 tablet (25 mg total) by mouth 2 (two) times daily. 180 tablet 3   Multiple Vitamin (MULTIVITAMIN WITH MINERALS) TABS tablet Take 1 tablet by mouth daily.     potassium chloride  SA (KLOR-CON  M) 20 MEQ tablet Take 2 tablets (40 mEq  total) by mouth once for 1 dose. Take after 12:00PM [name] 09/06 (Patient not taking: Reported [name] 03/29/2024) 2 tablet 0   sildenafil (REVATIO) 20 MG tablet Take 40 mg by mouth daily as needed (ED).     traMADol  (ULTRAM ) 50 MG tablet Take 1 tablet (50 mg total) by mouth every 6 (six) hours as needed for moderate [name] ([name] score 4-6). 28 tablet 0   No current facility-administered medications for this visit.   Facility-Administered Medications Ordered in Other Visits  Medication Dose  Route Frequency Provider Last Rate Last Admin   [facility], Patient & Family Education   Does not apply Once Lightfoot, Linnie KIDD, MD        Allergies  Allergen Reactions   Brilinta  [Ticagrelor ] Shortness Of Breath    Social History   Socioeconomic History   Marital status: Married    Spouse name: Not [name] file   Number of children: Not [name] file   Years of education: Not [name] file   Highest education level: Not [name] file  Occupational History   Occupation: Insurance agent  Tobacco Use   Smoking status: Former    Types: Cigars    Quit date: 01/16/2016    Years since quitting: 8.2   Smokeless tobacco: Never   Tobacco comments:    02/26/2016 might smoke 1/month  Vaping Use   Vaping status: Never Used  Substance and Sexual Activity   Alcohol  use: Yes    Alcohol /week: 18.0 standard drinks of alcohol     Types: 18 Cans of beer per week   Drug use: No   Sexual activity: Not Currently  Other Topics Concern   Not [name] file  Social History Narrative   Not [name] file   Social Drivers of Health   Tobacco Use: Medium Risk (03/02/2024)   Patient History    Smoking Tobacco Use: Former    Smokeless Tobacco Use: Never    Passive Exposure: Not [name] Actuary Strain: Not [name] file  Food Insecurity: No Food Insecurity (01/12/2024)   Epic    Worried About Programme Researcher, Broadcasting/film/video in the Last Year: Never true    Ran Out of Food in the Last Year: Never true  Transportation Needs: No Transportation Needs (01/12/2024)   Epic    Lack of Transportation (Medical): No    Lack of Transportation (Non-Medical): No  Physical Activity: Not [name] file  Stress: Not [name] file  Social Connections: Patient Unable To Answer (01/12/2024)   Social Connection and Isolation Panel    Frequency of Communication with Friends and Family: Patient unable to answer    Frequency of Social Gatherings with Friends and Family: Patient unable to answer    Attends Religious Services: Patient unable to answer     Active Member of Clubs or Organizations: Patient unable to answer    Attends Banker Meetings: Patient unable to answer    Marital Status: Patient unable to answer  Intimate Partner Violence: Not At Risk (01/12/2024)   Epic    Fear of Current or Ex-Partner: No    Emotionally Abused: No    Physically Abused: No    Sexually Abused: No  Depression (PHQ2-9): Low Risk (03/29/2024)   Depression (PHQ2-9)    PHQ-2 Score: 0  Alcohol  Screen: Not [name] file  Housing: Low Risk (01/12/2024)   Epic    Unable to Pay for Housing in the Last Year: No    Number of Times Moved in the Last Year:  0    Homeless in the Last Year: No  Utilities: Not At Risk (01/12/2024)   Epic    Threatened with loss of utilities: No  Health Literacy: Not [name] file    Family History  Problem Relation Age of Onset   Hypertension Sister    Stroke Brother     Review of Systems:  As stated in the HPI and otherwise negative.   There were no vitals taken for this visit.  Physical Examination: General: Well developed, well nourished, NAD  HEENT: OP clear, mucus membranes moist  SKIN: warm, dry. No rashes. Neuro: No focal deficits  Musculoskeletal: Muscle strength 5/5 all ext  Psychiatric: Mood and affect normal  Neck: No JVD, no carotid bruits, no thyromegaly, no lymphadenopathy.  Lungs:Clear bilaterally, no wheezes, rhonci, crackles Cardiovascular: Regular rate and rhythm. No murmurs, gallops or rubs. Abdomen:Soft. Bowel sounds present. Non-tender.  Extremities: No Jeffrey [name]. Pulses are 2 + in the bilateral DP/PT.  EKG:  EKG is ordered today. The ekg ordered today demonstrates       Recent Labs: 01/25/2024: Magnesium  2.2; TSH 0.839 02/16/2024: ALT 69; BUN 16; Creatinine, Ser 0.98; Hemoglobin 12.0; Platelets 222; Potassium 4.5; Sodium 140   Lipid Panel    Component Value Date/Time   CHOL 111 10/25/2018 0833   TRIG 91 10/25/2018 0833   HDL 39 (L) 10/25/2018 0833   CHOLHDL 2.8  10/25/2018 0833   LDLCALC 54 10/25/2018 0833     Wt Readings from Last 3 Encounters:  03/29/24 243 lb 2.7 oz (110.3 kg)  03/02/24 237 lb (107.5 kg)  02/16/24 237 lb 9.6 oz (107.8 kg)    Assessment and Plan:   1. CAD without angina: He has no chest [name]. Continue Plavix  and statin.   2. Jeffrey [name]: BP is controlled at home. He will continue to follow at home. Continue Norvasc .   3. Hyperlipidemia: Lipids followed in primary care. LDL 61 in September 2024. Continue statin.   Labs/ tests ordered today include:   No orders of the defined types were placed in this encounter.  Disposition:   F/U with me in one year.    Signed, Lonni Cash, MD, [facility] 05/02/2024 3:52 PM    [facility] [address] [place], [place], [place]  [address] Phone: [phone]; Fax: [phone]

## 2024-05-03 ENCOUNTER — Encounter: Payer: Self-pay | Admitting: Cardiovascular Disease

## 2024-05-03 ENCOUNTER — Ambulatory Visit: Admitting: Cardiovascular Disease

## 2024-05-03 VITALS — BP 124/60 | HR 60 | Ht 71.0 in | Wt 237.2 lb

## 2024-05-03 DIAGNOSIS — I10 Essential (primary) hypertension: Secondary | ICD-10-CM | POA: Diagnosis not present

## 2024-05-03 DIAGNOSIS — I480 Paroxysmal atrial fibrillation: Secondary | ICD-10-CM

## 2024-05-03 DIAGNOSIS — I2510 Atherosclerotic heart disease of native coronary artery without angina pectoris: Secondary | ICD-10-CM | POA: Diagnosis not present

## 2024-05-03 DIAGNOSIS — E785 Hyperlipidemia, unspecified: Secondary | ICD-10-CM

## 2024-05-03 NOTE — Patient Instructions (Signed)
 Medication Instructions:  Your physician recommends that you continue on your current medications as directed. Please refer to the Current Medication list given to you today.  *If you need a refill on your cardiac medications before your next appointment, please call your pharmacy*  Lab Work: none If you have labs (blood work) drawn today and your tests are completely normal, you will receive your results only by: MyChart Message (if you have MyChart) OR A paper copy in the mail If you have any lab test that is abnormal or we need to change your treatment, we will call you to review the results.  Testing/Procedures: none  Follow-Up: At [facility], you and your health needs are our priority.  As part of our continuing mission to provide you with exceptional heart care, our providers are all part of one team.  This team includes your primary Cardiologist (physician) and Advanced Practice Providers or APPs (Physician Assistants and Nurse Practitioners) who all work together to provide you with the care you need, when you need it.  Your next appointment:   6 month(s)  Provider:   Lonni Cash, MD    We recommend signing up for the patient portal called MyChart.  Sign up information is provided on this After Visit Summary.  MyChart is used to connect with patients for Virtual Visits (Telemedicine).  Patients are able to view lab/test results, encounter notes, upcoming appointments, etc.  Non-urgent messages can be sent to your provider as well.   To learn more about what you can do with MyChart, go to [email or web address].   Other Instructions

## 2024-05-04 ENCOUNTER — Encounter (HOSPITAL_COMMUNITY): Admission: RE | Admit: 2024-05-04 | Source: Ambulatory Visit

## 2024-05-07 ENCOUNTER — Encounter (HOSPITAL_COMMUNITY)

## 2024-05-08 ENCOUNTER — Other Ambulatory Visit (HOSPITAL_COMMUNITY): Payer: Self-pay

## 2024-05-09 ENCOUNTER — Encounter (HOSPITAL_COMMUNITY)

## 2024-05-11 ENCOUNTER — Encounter (HOSPITAL_COMMUNITY)

## 2024-05-14 ENCOUNTER — Encounter (HOSPITAL_COMMUNITY)

## 2024-05-15 NOTE — Progress Notes (Signed)
 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  Patient's Home Medications on Admission: Current Medications[1]  Past Medical History: Past Medical History:  Diagnosis Date   Chest wall abscess 2012   lanced it & it's gone   Coronary artery disease    Elevated cholesterol    GERD (gastroesophageal reflux disease)     Tobacco Use: Tobacco Use History[2]  Labs: Review Flowsheet  More data may exist      Latest Ref Rng & Units 07/20/2016 08/17/2017 10/25/2018 01/11/2024 01/12/2024  Labs for ITP Cardiac and Pulmonary Rehab  Cholestrol 100 - 199 mg/dL 890  886  888  - -  LDL (calc) 0 - 99 mg/dL 59  61  54  - -  HDL-C >39 mg/dL 41  43  39  - -  Trlycerides 0 - 149 mg/dL 47  47  91  - -  Hemoglobin A1c 4.8 - 5.6 % - - 5.0  5.1  -  PH, Arterial 7.35 - 7.45 - - - - 7.337  7.270  7.302  7.374  7.363  7.378  7.377   PCO2 arterial 32 - 48 mmHg - - - - 38.5  43.8  37.8  38.3  37.4  38.6  36.0   Bicarbonate 20.0 - 28.0 mmol/L - - - - 20.6  20.2  18.8  22.5  21.3  23.0  22.7  21.2   TCO2 22 - 32 mmol/L - - - - 22  22  20  24  23  22  26   33  24  24  23  21  22    Acid-base deficit 0.0 - 2.0 mmol/L - - - - 5.0  7.0  7.0  3.0  4.0  3.0  2.0  4.0   O2 Saturation % - - - - 94  97  98  98  100  81  100  100     Details       Multiple values from one day are sorted in reverse-chronological order         Capillary Blood Glucose: Lab Results  Component Value Date   GLUCAP 101 (H) 01/17/2024    GLUCAP 110 (H) 01/17/2024   GLUCAP 167 (H) 01/16/2024   GLUCAP 118 (H) 01/16/2024   GLUCAP 102 (H) 01/16/2024     Exercise Target Goals: Exercise Program Goal: Individual exercise prescription set using results from initial 6 min walk test and THRR while considering  patients activity barriers and safety.   Exercise Prescription Goal: Initial exercise prescription builds to 30-45 minutes a day of aerobic activity, 2-3 days per week.  Home exercise guidelines will be given to patient during program as part of exercise prescription that the participant will acknowledge.  Activity Barriers & Risk Stratification:  Activity Barriers & Cardiac Risk Stratification - 03/29/24 1112       Activity Barriers & Cardiac Risk Stratification   Activity Barriers History of Falls;Balance Concerns;Joint Problems    Cardiac Risk Stratification High  6 Minute Walk:  6 Minute Walk     Row Name 03/29/24 1344         6 Minute Walk   Phase Initial     Distance 1700 feet     Walk Time 6 minutes     # of Rest Breaks 0     MPH 3.22     METS 3.18     RPE 7     Perceived Dyspnea  0     VO2 Peak 11.1     Symptoms No     Resting HR 51 bpm     Resting BP 110/70     Resting Oxygen Saturation  96 %     Exercise Oxygen Saturation  during 6 min walk 97 %     Max Ex. HR 90 bpm     Max Ex. BP 136/76     2 Minute Post BP 124/66        Oxygen Initial Assessment:   Oxygen Re-Evaluation:   Oxygen Discharge (Final Oxygen Re-Evaluation):   Initial Exercise Prescription:  Initial Exercise Prescription - 03/29/24 1100       Date of Initial Exercise RX and Referring Provider   Date 03/29/24    Referring Provider Lonni Cash, MD    Expected Discharge Date 06/20/24      Arm Ergometer   Level 2    Watts 60    RPM 60    Minutes 15    METs 3.18      Recumbant Elliptical   Level 2    RPM 60    Watts 75    Minutes 15    METs 3.18      Prescription Details   Frequency  (times per week) 3    Duration Progress to 30 minutes of continuous aerobic without signs/symptoms of physical distress      Intensity   THRR 40-80% of Max Heartrate 60-120    Ratings of Perceived Exertion 11-13    Perceived Dyspnea 0-4      Progression   Progression Continue progressive overload as per policy without signs/symptoms or physical distress.      Resistance Training   Training Prescription Yes    Weight 4 lbs    Reps 10-15          Perform Capillary Blood Glucose checks as needed.  Exercise Prescription Changes:   Exercise Prescription Changes     Row Name 04/02/24 1512 05/02/24 1723           Response to Exercise   Blood Pressure (Admit) 118/62 134/60      Blood Pressure (Exercise) 142/84 140/68      Blood Pressure (Exit) 128/70 128/60      Heart Rate (Admit) 61 bpm 70 bpm      Heart Rate (Exercise) 80 bpm 102 bpm      Heart Rate (Exit) 58 bpm 72 bpm      Rating of Perceived Exertion (Exercise) 9 8      Symptoms None None      Comments Off to a good start with exercise. Reviewed MET's and goals      Duration Continue with 30 min of aerobic exercise without signs/symptoms of physical distress. Continue with 30 min of aerobic exercise without signs/symptoms of physical distress.      Intensity THRR unchanged THRR unchanged        Progression   Progression Continue to progress workloads to maintain intensity without signs/symptoms of physical distress. Continue  to progress workloads to maintain intensity without signs/symptoms of physical distress.      Average METs 1.8 2        Resistance Training   Training Prescription Yes --      Weight 4 lbs 4 lbs      Reps 10-15 10-15      Time 5 Minutes 5 Minutes        Interval Training   Interval Training No No        Arm Ergometer   Level 2 2      Watts 16 16      RPM 52 --      Minutes 15 15      METs 1.7 1.7        Recumbant Elliptical   Level 2 2      RPM 44 50      Watts 66 63      Minutes  15 15      METs 1.9 2.3         Exercise Comments:   Exercise Comments     Row Name 04/02/24 1601 04/16/24 1737 05/02/24 1727       Exercise Comments Mandel tolerated first session of exercise well without symptoms. Oriented him to the exercise equipment and stretching routine. Patient has inconsistent attendance. Absent since 11/21. Will review education when attendance becomes more regular Reviewed MET's and goals. Pt tolerated exercise well with an average MEt level of 2.0. Talked over how in increase MET's and Wl's. He feels good with his goals and is already having some weight loss and he feels an increase in strength and stamina        Exercise Goals and Review:   Exercise Goals     Row Name 03/29/24 1111             Exercise Goals   Increase Physical Activity Yes       Intervention Provide advice, education, support and counseling about physical activity/exercise needs.;Develop an individualized exercise prescription for aerobic and resistive training based on initial evaluation findings, risk stratification, comorbidities and participant's personal goals.       Expected Outcomes Short Term: Attend rehab on a regular basis to increase amount of physical activity.;Long Term: Add in home exercise to make exercise part of routine and to increase amount of physical activity.;Long Term: Exercising regularly at least 3-5 days a week.       Increase Strength and Stamina Yes       Intervention Provide advice, education, support and counseling about physical activity/exercise needs.;Develop an individualized exercise prescription for aerobic and resistive training based on initial evaluation findings, risk stratification, comorbidities and participant's personal goals.       Expected Outcomes Short Term: Increase workloads from initial exercise prescription for resistance, speed, and METs.;Short Term: Perform resistance training exercises routinely during rehab and add in resistance  training at home;Long Term: Improve cardiorespiratory fitness, muscular endurance and strength as measured by increased METs and functional capacity ( )       Able to understand and use rate of perceived exertion (RPE) scale Yes       Intervention Provide education and explanation on how to use RPE scale       Expected Outcomes Short Term: Able to use RPE daily in rehab to express subjective intensity level;Long Term:  Able to use RPE to guide intensity level when exercising independently       Knowledge and understanding of Target Heart Rate Range (  THRR) Yes       Intervention Provide education and explanation of THRR including how the numbers were predicted and where they are located for reference       Expected Outcomes Short Term: Able to state/look up THRR;Long Term: Able to use THRR to govern intensity when exercising independently;Short Term: Able to use daily as guideline for intensity in rehab       Understanding of Exercise Prescription Yes       Intervention Provide education, explanation, and written materials on patient's individual exercise prescription       Expected Outcomes Short Term: Able to explain program exercise prescription;Long Term: Able to explain home exercise prescription to exercise independently          Exercise Goals Re-Evaluation :  Exercise Goals Re-Evaluation     Row Name 04/02/24 1601 05/02/24 1725           Exercise Goal Re-Evaluation   Exercise Goals Review Increase Physical Activity;Increase Strength and Stamina;Able to understand and use rate of perceived exertion (RPE) scale Increase Physical Activity;Increase Strength and Stamina;Able to understand and use rate of perceived exertion (RPE) scale;Understanding of Exercise Prescription;Knowledge and understanding of Target Heart Rate Range (THRR)      Comments Larkin was able to understand and use RPE scale appropriately. Reviewed MET's and goals. Pt tolerated exercise well with an average MEt level  of 2.0. Talked over how in increase MET's and Wl's. He feels good with his goals and is already having some weight loss and he feels an increase in strength and stamina      Expected Outcomes Progress workloads as tolerated to help increase strength and stamina. Progress workloads as tolerated to help increase strength and stamina.         Discharge Exercise Prescription (Final Exercise Prescription Changes):  Exercise Prescription Changes - 05/02/24 1723       Response to Exercise   Blood Pressure (Admit) 134/60    Blood Pressure (Exercise) 140/68    Blood Pressure (Exit) 128/60    Heart Rate (Admit) 70 bpm    Heart Rate (Exercise) 102 bpm    Heart Rate (Exit) 72 bpm    Rating of Perceived Exertion (Exercise) 8    Symptoms None    Comments Reviewed MET's and goals    Duration Continue with 30 min of aerobic exercise without signs/symptoms of physical distress.    Intensity THRR unchanged      Progression   Progression Continue to progress workloads to maintain intensity without signs/symptoms of physical distress.    Average METs 2      Resistance Training   Weight 4 lbs    Reps 10-15    Time 5 Minutes      Interval Training   Interval Training No      Arm Ergometer   Level 2    Watts 16    Minutes 15    METs 1.7      Recumbant Elliptical   Level 2    RPM 50    Watts 63    Minutes 15    METs 2.3          Nutrition:  Target Goals: Understanding of nutrition guidelines, daily intake of sodium 1500mg , cholesterol 200mg , calories 30% from fat and 7% or less from saturated fats, daily to have 5 or more servings of fruits and vegetables.  Biometrics:  Pre Biometrics - 03/29/24 1110       Pre Biometrics   Waist  Circumference 45 inches    Hip Circumference 46.5 inches    Waist to Hip Ratio 0.97 %    Triceps Skinfold 22 mm    % Body Fat 33.5 %    Grip Strength 22 kg    Flexibility 0 in    Single Leg Stand 13.12 seconds           Nutrition Therapy  Plan and Nutrition Goals:  Nutrition Therapy & Goals - 04/02/24 1526       Nutrition Therapy   Diet Heart Healthy    Drug/Food Interactions Statins/Certain Fruits      Personal Nutrition Goals   Nutrition Goal Patient to improve diet quality by using the plate method as a guide for meal planning to include lean protein/plant protein, fruits, vegetables, whole grains, nonfat dairy as part of a well-balanced diet.    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 Pt reports making healthy changes to diet, particularly limiting/avoiding fried food. RD provided additional information regarding heart healthy diet with emphasis on opting for plant-based foods to fill majority of plate. Encouraged pt to continue opting for lean sources of protein such as baked/grilled skinless poultry, fish.      Intervention Plan   Intervention Prescribe, educate and counsel regarding individualized specific dietary modifications aiming towards targeted core components such as weight, hypertension, lipid management, diabetes, heart failure and other comorbidities.;Nutrition handout(s) given to patient.   Heart Healthy Nutrition Therapy   Expected Outcomes Short Term Goal: Understand basic principles of dietary content, such as calories, fat, sodium, cholesterol and nutrients.;Long Term Goal: Adherence to prescribed nutrition plan.          Nutrition Assessments:  MEDIFICTS Score Key: >=70 Need to make dietary changes  40-70 Heart Healthy Diet <= 40 Therapeutic Level Cholesterol Diet    Picture Your Plate Scores: <59 Unhealthy dietary pattern with much room for improvement. 41-50 Dietary pattern unlikely to meet recommendations for good health and room for improvement. 51-60 More healthful dietary pattern, with some room for improvement.  >60 Healthy dietary pattern, although there may be some specific behaviors that could be improved.     Nutrition Goals Re-Evaluation:  Nutrition Goals Re-Evaluation     Row Name 04/25/24 1634             Goals   Current Weight 239 lb 13.8 oz (108.8 kg)       Nutrition Goal Patient to improve diet quality by using the plate method as a guide for meal planning to include lean protein/plant protein, fruits, vegetables, whole grains, nonfat dairy as part of a well-balanced diet.       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 Pt continues to avoid fried foods. States meals typically consist of lean protein such as chicken, fish. Also incorporating fruit/veggies and whole grains. Endorses eating oatmeal at breakfast some mornings. Pt pleased by 4 lb wt loss over past month; attributes largely to dietary changes. Patient will benefit from ongoing participation in cardiac rehab for nutrition education, exercise, and lifestyle modification.          Nutrition Goals Re-Evaluation:  Nutrition Goals Re-Evaluation     Row Name 04/25/24 1634             Goals   Current Weight 239 lb 13.8 oz (108.8 kg)       Nutrition Goal Patient  to improve diet quality by using the plate method as a guide for meal planning to include lean protein/plant protein, fruits, vegetables, whole grains, nonfat dairy as part of a well-balanced diet.       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 Pt continues to avoid fried foods. States meals typically consist of lean protein such as chicken, fish. Also incorporating fruit/veggies and whole grains. Endorses eating oatmeal at breakfast some mornings. Pt pleased by 4 lb wt loss over past month; attributes largely to dietary changes. Patient will benefit from ongoing participation in cardiac rehab for nutrition education, exercise, and lifestyle modification.          Nutrition Goals Discharge (Final Nutrition Goals  Re-Evaluation):  Nutrition Goals Re-Evaluation - 04/25/24 1634       Goals   Current Weight 239 lb 13.8 oz (108.8 kg)    Nutrition Goal Patient to improve diet quality by using the plate method as a guide for meal planning to include lean protein/plant protein, fruits, vegetables, whole grains, nonfat dairy as part of a well-balanced diet.    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 Pt continues to avoid fried foods. States meals typically consist of lean protein such as chicken, fish. Also incorporating fruit/veggies and whole grains. Endorses eating oatmeal at breakfast some mornings. Pt pleased by 4 lb wt loss over past month; attributes largely to dietary changes. Patient will benefit from ongoing participation in cardiac rehab for nutrition education, exercise, and lifestyle modification.          Psychosocial: Target Goals: Acknowledge presence or absence of significant depression and/or stress, maximize coping skills, provide positive support system. Participant is able to verbalize types and ability to use techniques and skills needed for reducing stress and depression.  Initial Review & Psychosocial Screening:  Initial Psych Review & Screening - 03/29/24 1130       Initial Review   Current issues with --    Source of Stress Concerns --    Comments --      Barriers   Psychosocial barriers to participate in program --      Screening Interventions   Expected Outcomes --          Quality of Life Scores:  Quality of Life - 03/29/24 1343       Quality of Life   Select Quality of Life      Quality of Life Scores   Health/Function Pre 30 %    Socioeconomic Pre 30 %    Psych/Spiritual Pre 30 %    Family Pre 30 %    GLOBAL Pre 30 %         Scores of 19 and below usually indicate a poorer quality of life in these areas.  A difference of  2-3 points is a clinically meaningful difference.  A  difference of 2-3 points in the total score of the Quality of Life Index has been associated with significant improvement in overall quality of life, self-image, physical symptoms, and general health in studies assessing change in quality of life.  PHQ-9: Review Flowsheet       03/29/2024  Depression screen PHQ 2/9  Decreased Interest 0  Down, Depressed, Hopeless 0  PHQ - 2 Score 0  Altered sleeping 0  Tired, decreased energy 0  Change in appetite 0  Feeling bad or  failure about yourself  0  Trouble concentrating 0  Moving slowly or fidgety/restless 0  Suicidal thoughts 0  PHQ-9 Score 0   Interpretation of Total Score  Total Score Depression Severity:  1-4 = Minimal depression, 5-9 = Mild depression, 10-14 = Moderate depression, 15-19 = Moderately severe depression, 20-27 = Severe depression   Psychosocial Evaluation and Intervention:   Psychosocial Re-Evaluation:  Psychosocial Re-Evaluation     Row Name 04/04/24 1704 04/17/24 1618 05/15/24 1626         Psychosocial Re-Evaluation   Current issues with None Identified Current Stress Concerns None Identified     Comments -- Kjell has voiced no additional psychosocial concerns/stressors during exercise at cardiac rehab since his orientation (03/29/24). --     Expected Outcomes -- Clarance will voice controlled or decreased psychosocial concerns/stressors by completion of cardiac rehab. --     Interventions Encouraged to attend Cardiac Rehabilitation for the exercise Encouraged to attend Cardiac Rehabilitation for the exercise Encouraged to attend Cardiac Rehabilitation for the exercise     Continue Psychosocial Services  No Follow up required No Follow up required No Follow up required        Psychosocial Discharge (Final Psychosocial Re-Evaluation):  Psychosocial Re-Evaluation - 05/15/24 1626       Psychosocial Re-Evaluation   Current issues with None Identified    Interventions Encouraged to attend Cardiac  Rehabilitation for the exercise    Continue Psychosocial Services  No Follow up required          Vocational Rehabilitation: Provide vocational rehab assistance to qualifying candidates.   Vocational Rehab Evaluation & Intervention:  Vocational Rehab - 03/29/24 1119       Initial Vocational Rehab Evaluation & Intervention   Assessment shows need for Vocational Rehabilitation No   back to work         Education: Education Goals: Education classes will be provided on a weekly basis, covering required topics. Participant will state understanding/return demonstration of topics presented.    Education     Row Name 04/02/24 1400     Education   Cardiac Education Topics Pritikin   Psychologist, Forensic Exercise Education   Exercise Education Move It!   Instruction Review Code 1- Verbalizes Understanding   Class Start Time 1412   Class Stop Time 1447   Class Time Calculation (min) 35 min    Row Name 04/04/24 1400     Education   Cardiac Education Topics Pritikin   Customer Service Manager   Weekly Topic Efficiency Cooking - Meals in a Snap   Instruction Review Code 1- Verbalizes Understanding   Class Start Time 1400   Class Stop Time 1444   Class Time Calculation (min) 44 min    Row Name 04/25/24 1500     Education   Cardiac Education Topics Pritikin   Customer Service Manager   Weekly Topic Fast Evening Meals   Instruction Review Code 1- Verbalizes Understanding   Class Start Time 1403   Class Stop Time 1447   Class Time Calculation (min) 44 min      Core Videos: Exercise    Move It!  Clinical staff conducted group or individual video education with verbal and written material and guidebook.  Patient learns the recommended Pritikin exercise program. Exercise with the goal  of living a long, healthy life. Some of the health  benefits of exercise include controlled diabetes, healthier blood pressure levels, improved cholesterol levels, improved heart and lung capacity, improved sleep, and better body composition. Everyone should speak with their doctor before starting or changing an exercise routine.  Biomechanical Limitations Clinical staff conducted group or individual video education with verbal and written material and guidebook.  Patient learns how biomechanical limitations can impact exercise and how we can mitigate and possibly overcome limitations to have an impactful and balanced exercise routine.  Body Composition Clinical staff conducted group or individual video education with verbal and written material and guidebook.  Patient learns that body composition (ratio of muscle mass to fat mass) is a key component to assessing overall fitness, rather than body weight alone. Increased fat mass, especially visceral belly fat, can put us  at increased risk for metabolic syndrome, type 2 diabetes, heart disease, and even death. It is recommended to combine diet and exercise (cardiovascular and resistance training) to improve your body composition. Seek guidance from your physician and exercise physiologist before implementing an exercise routine.  Exercise Action Plan Clinical staff conducted group or individual video education with verbal and written material and guidebook.  Patient learns the recommended strategies to achieve and enjoy long-term exercise adherence, including variety, self-motivation, self-efficacy, and positive decision making. Benefits of exercise include fitness, good health, weight management, more energy, better sleep, less stress, and overall well-being.  Medical   Heart Disease Risk Reduction Clinical staff conducted group or individual video education with verbal and written material and guidebook.  Patient learns our heart is our most vital organ as it circulates oxygen, nutrients, white  blood cells, and hormones throughout the entire body, and carries waste away. Data supports a plant-based eating plan like the Pritikin Program for its effectiveness in slowing progression of and reversing heart disease. The video provides a number of recommendations to address heart disease.   Metabolic Syndrome and Belly Fat  Clinical staff conducted group or individual video education with verbal and written material and guidebook.  Patient learns what metabolic syndrome is, how it leads to heart disease, and how one can reverse it and keep it from coming back. You have metabolic syndrome if you have 3 of the following 5 criteria: abdominal obesity, high blood pressure, high triglycerides, low HDL cholesterol, and high blood sugar.  Hypertension and Heart Disease Clinical staff conducted group or individual video education with verbal and written material and guidebook.  Patient learns that high blood pressure, or hypertension, is very common in the [place] . Hypertension is largely due to excessive salt intake, but other important risk factors include being overweight, physical inactivity, drinking too much alcohol , smoking, and not eating enough potassium from fruits and vegetables. High blood pressure is a leading risk factor for heart attack, stroke, congestive heart failure, dementia, kidney failure, and premature death. Long-term effects of excessive salt intake include stiffening of the arteries and thickening of heart muscle and organ damage. Recommendations include ways to reduce hypertension and the risk of heart disease.  Diseases of Our Time - Focusing on Diabetes Clinical staff conducted group or individual video education with verbal and written material and guidebook.  Patient learns why the best way to stop diseases of our time is prevention, through food and other lifestyle changes. Medicine (such as prescription pills and surgeries) is often only a Band-Aid on the problem, not a  long-term solution. Most common diseases of our time include obesity,  type 2 diabetes, hypertension, heart disease, and cancer. The Pritikin Program is recommended and has been proven to help reduce, reverse, and/or prevent the damaging effects of metabolic syndrome.  Nutrition   Overview of the Pritikin Eating Plan  Clinical staff conducted group or individual video education with verbal and written material and guidebook.  Patient learns about the Pritikin Eating Plan for disease risk reduction. The Pritikin Eating Plan emphasizes a wide variety of unrefined, minimally-processed carbohydrates, like fruits, vegetables, whole grains, and legumes. Go, Caution, and Stop food choices are explained. Plant-based and lean animal proteins are emphasized. Rationale provided for low sodium intake for blood pressure control, low added sugars for blood sugar stabilization, and low added fats and oils for coronary artery disease risk reduction and weight management.  Calorie Density  Clinical staff conducted group or individual video education with verbal and written material and guidebook.  Patient learns about calorie density and how it impacts the Pritikin Eating Plan. Knowing the characteristics of the food you choose will help you decide whether those foods will lead to weight gain or weight loss, and whether you want to consume more or less of them. Weight loss is usually a side effect of the Pritikin Eating Plan because of its focus on low calorie-dense foods.  Label Reading  Clinical staff conducted group or individual video education with verbal and written material and guidebook.  Patient learns about the Pritikin recommended label reading guidelines and corresponding recommendations regarding calorie density, added sugars, sodium content, and whole grains.  Dining Out - Part 1  Clinical staff conducted group or individual video education with verbal and written material and guidebook.  Patient learns  that restaurant meals can be sabotaging because they can be so high in calories, fat, sodium, and/or sugar. Patient learns recommended strategies on how to positively address this and avoid unhealthy pitfalls.  Facts on Fats  Clinical staff conducted group or individual video education with verbal and written material and guidebook.  Patient learns that lifestyle modifications can be just as effective, if not more so, as many medications for lowering your risk of heart disease. A Pritikin lifestyle can help to reduce your risk of inflammation and atherosclerosis (cholesterol build-up, or plaque, in the artery walls). Lifestyle interventions such as dietary choices and physical activity address the cause of atherosclerosis. A review of the types of fats and their impact on blood cholesterol levels, along with dietary recommendations to reduce fat intake is also included.  Nutrition Action Plan  Clinical staff conducted group or individual video education with verbal and written material and guidebook.  Patient learns how to incorporate Pritikin recommendations into their lifestyle. Recommendations include planning and keeping personal health goals in mind as an important part of their success.  Healthy Mind-Set    Healthy Minds, Bodies, Hearts  Clinical staff conducted group or individual video education with verbal and written material and guidebook.  Patient learns how to identify when they are stressed. Video will discuss the impact of that stress, as well as the many benefits of stress management. Patient will also be introduced to stress management techniques. The way we think, act, and feel has an impact on our hearts.  How Our Thoughts Can Heal Our Hearts  Clinical staff conducted group or individual video education with verbal and written material and guidebook.  Patient learns that negative thoughts can cause depression and anxiety. This can result in negative lifestyle behavior and serious  health problems. Cognitive behavioral therapy is an  effective method to help control our thoughts in order to change and improve our emotional outlook.  Additional Videos:  Exercise    Improving Performance  Clinical staff conducted group or individual video education with verbal and written material and guidebook.  Patient learns to use a non-linear approach by alternating intensity levels and lengths of time spent exercising to help burn more calories and lose more body fat. Cardiovascular exercise helps improve heart health, metabolism, hormonal balance, blood sugar control, and recovery from fatigue. Resistance training improves strength, endurance, balance, coordination, reaction time, metabolism, and muscle mass. Flexibility exercise improves circulation, posture, and balance. Seek guidance from your physician and exercise physiologist before implementing an exercise routine and learn your capabilities and proper form for all exercise.  Introduction to Yoga  Clinical staff conducted group or individual video education with verbal and written material and guidebook.  Patient learns about yoga, a discipline of the coming together of mind, breath, and body. The benefits of yoga include improved flexibility, improved range of motion, better posture and core strength, increased lung function, weight loss, and positive self-image. Yogas heart health benefits include lowered blood pressure, healthier heart rate, decreased cholesterol and triglyceride levels, improved immune function, and reduced stress. Seek guidance from your physician and exercise physiologist before implementing an exercise routine and learn your capabilities and proper form for all exercise.  Medical   Aging: Enhancing Your Quality of Life  Clinical staff conducted group or individual video education with verbal and written material and guidebook.  Patient learns key strategies and recommendations to stay in good physical health  and enhance quality of life, such as prevention strategies, having an advocate, securing a Health Care Proxy and Power of Attorney, and keeping a list of medications and system for tracking them. It also discusses how to avoid risk for bone loss.  Biology of Weight Control  Clinical staff conducted group or individual video education with verbal and written material and guidebook.  Patient learns that weight gain occurs because we consume more calories than we burn (eating more, moving less). Even if your body weight is normal, you may have higher ratios of fat compared to muscle mass. Too much body fat puts you at increased risk for cardiovascular disease, heart attack, stroke, type 2 diabetes, and obesity-related cancers. In addition to exercise, following the Pritikin Eating Plan can help reduce your risk.  Decoding Lab Results  Clinical staff conducted group or individual video education with verbal and written material and guidebook.  Patient learns that lab test reflects one measurement whose values change over time and are influenced by many factors, including medication, stress, sleep, exercise, food, hydration, pre-existing medical conditions, and more. It is recommended to use the knowledge from this video to become more involved with your lab results and evaluate your numbers to speak with your doctor.   Diseases of Our Time - Overview  Clinical staff conducted group or individual video education with verbal and written material and guidebook.  Patient learns that according to the CDC, 50% to 70% of chronic diseases (such as obesity, type 2 diabetes, elevated lipids, hypertension, and heart disease) are avoidable through lifestyle improvements including healthier food choices, listening to satiety cues, and increased physical activity.  Sleep Disorders Clinical staff conducted group or individual video education with verbal and written material and guidebook.  Patient learns how good  quality and duration of sleep are important to overall health and well-being. Patient also learns about sleep disorders and how they  impact health along with recommendations to address them, including discussing with a physician.  Nutrition  Dining Out - Part 2 Clinical staff conducted group or individual video education with verbal and written material and guidebook.  Patient learns how to plan ahead and communicate in order to maximize their dining experience in a healthy and nutritious manner. Included are recommended food choices based on the type of restaurant the patient is visiting.   Fueling a Banker conducted group or individual video education with verbal and written material and guidebook.  There is a strong connection between our food choices and our health. Diseases like obesity and type 2 diabetes are very prevalent and are in large-part due to lifestyle choices. The Pritikin Eating Plan provides plenty of food and hunger-curbing satisfaction. It is easy to follow, affordable, and helps reduce health risks.  Menu Workshop  Clinical staff conducted group or individual video education with verbal and written material and guidebook.  Patient learns that restaurant meals can sabotage health goals because they are often packed with calories, fat, sodium, and sugar. Recommendations include strategies to plan ahead and to communicate with the manager, chef, or server to help order a healthier meal.  Planning Your Eating Strategy  Clinical staff conducted group or individual video education with verbal and written material and guidebook.  Patient learns about the Pritikin Eating Plan and its benefit of reducing the risk of disease. The Pritikin Eating Plan does not focus on calories. Instead, it emphasizes high-quality, nutrient-rich foods. By knowing the characteristics of the foods, we choose, we can determine their calorie density and make informed  decisions.  Targeting Your Nutrition Priorities  Clinical staff conducted group or individual video education with verbal and written material and guidebook.  Patient learns that lifestyle habits have a tremendous impact on disease risk and progression. This video provides eating and physical activity recommendations based on your personal health goals, such as reducing LDL cholesterol, losing weight, preventing or controlling type 2 diabetes, and reducing high blood pressure.  Vitamins and Minerals  Clinical staff conducted group or individual video education with verbal and written material and guidebook.  Patient learns different ways to obtain key vitamins and minerals, including through a recommended healthy diet. It is important to discuss all supplements you take with your doctor.   Healthy Mind-Set    Smoking Cessation  Clinical staff conducted group or individual video education with verbal and written material and guidebook.  Patient learns that cigarette smoking and tobacco addiction pose a serious health risk which affects millions of people. Stopping smoking will significantly reduce the risk of heart disease, lung disease, and many forms of cancer. Recommended strategies for quitting are covered, including working with your doctor to develop a successful plan.  Culinary   Becoming a Set Designer conducted group or individual video education with verbal and written material and guidebook.  Patient learns that cooking at home can be healthy, cost-effective, quick, and puts them in control. Keys to cooking healthy recipes will include looking at your recipe, assessing your equipment needs, planning ahead, making it simple, choosing cost-effective seasonal ingredients, and limiting the use of added fats, salts, and sugars.  Cooking - Breakfast and Snacks  Clinical staff conducted group or individual video education with verbal and written material and guidebook.   Patient learns how important breakfast is to satiety and nutrition through the entire day. Recommendations include key foods to eat during breakfast to help stabilize  blood sugar levels and to prevent overeating at meals later in the day. Planning ahead is also a key component.  Cooking - Educational Psychologist conducted group or individual video education with verbal and written material and guidebook.  Patient learns eating strategies to improve overall health, including an approach to cook more at home. Recommendations include thinking of animal protein as a side on your plate rather than center stage and focusing instead on lower calorie dense options like vegetables, fruits, whole grains, and plant-based proteins, such as beans. Making sauces in large quantities to freeze for later and leaving the skin on your vegetables are also recommended to maximize your experience.  Cooking - Healthy Salads and Dressing Clinical staff conducted group or individual video education with verbal and written material and guidebook.  Patient learns that vegetables, fruits, whole grains, and legumes are the foundations of the Pritikin Eating Plan. Recommendations include how to incorporate each of these in flavorful and healthy salads, and how to create homemade salad dressings. Proper handling of ingredients is also covered. Cooking - Soups and [employer] - Soups and Desserts Clinical staff conducted group or individual video education with verbal and written material and guidebook.  Patient learns that Pritikin soups and desserts make for easy, nutritious, and delicious snacks and meal components that are low in sodium, fat, sugar, and calorie density, while high in vitamins, minerals, and filling fiber. Recommendations include simple and healthy ideas for soups and desserts.   Overview     The Pritikin Solution Program Overview Clinical staff conducted group or individual video education with  verbal and written material and guidebook.  Patient learns that the results of the Pritikin Program have been documented in more than 100 articles published in peer-reviewed journals, and the benefits include reducing risk factors for (and, in some cases, even reversing) high cholesterol, high blood pressure, type 2 diabetes, obesity, and more! An overview of the three key pillars of the Pritikin Program will be covered: eating well, doing regular exercise, and having a healthy mind-set.  WORKSHOPS  Exercise: Exercise Basics: Building Your Action Plan Clinical staff led group instruction and group discussion with PowerPoint presentation and patient guidebook. To enhance the learning environment the use of posters, models and videos may be added. At the conclusion of this workshop, patients will comprehend the difference between physical activity and exercise, as well as the benefits of incorporating both, into their routine. Patients will understand the FITT (Frequency, Intensity, Time, and Type) principle and how to use it to build an exercise action plan. In addition, safety concerns and other considerations for exercise and cardiac rehab will be addressed by the presenter. The purpose of this lesson is to promote a comprehensive and effective weekly exercise routine in order to improve patients overall level of fitness.   Managing Heart Disease: Your Path to a Healthier Heart Clinical staff led group instruction and group discussion with PowerPoint presentation and patient guidebook. To enhance the learning environment the use of posters, models and videos may be added.At the conclusion of this workshop, patients will understand the anatomy and physiology of the heart. Additionally, they will understand how Pritikins three pillars impact the risk factors, the progression, and the management of heart disease.  The purpose of this lesson is to provide a high-level overview of the heart, heart  disease, and how the Pritikin lifestyle positively impacts risk factors.  Exercise Biomechanics Clinical staff led group instruction and group discussion with PowerPoint  presentation and patient guidebook. To enhance the learning environment the use of posters, models and videos may be added. Patients will learn how the structural parts of their bodies function and how these functions impact their daily activities, movement, and exercise. Patients will learn how to promote a neutral spine, learn how to manage pain, and identify ways to improve their physical movement in order to promote healthy living. The purpose of this lesson is to expose patients to common physical limitations that impact physical activity. Participants will learn practical ways to adapt and manage aches and pains, and to minimize their effect on regular exercise. Patients will learn how to maintain good posture while sitting, walking, and lifting.  Balance Training and Fall Prevention  Clinical staff led group instruction and group discussion with PowerPoint presentation and patient guidebook. To enhance the learning environment the use of posters, models and videos may be added. At the conclusion of this workshop, patients will understand the importance of their sensorimotor skills (vision, proprioception, and the vestibular system) in maintaining their ability to balance as they age. Patients will apply a variety of balancing exercises that are appropriate for their current level of function. Patients will understand the common causes for poor balance, possible solutions to these problems, and ways to modify their physical environment in order to minimize their fall risk. The purpose of this lesson is to teach patients about the importance of maintaining balance as they age and ways to minimize their risk of falling.  WORKSHOPS   Nutrition:  Fueling a Ship Broker led group instruction and group  discussion with PowerPoint presentation and patient guidebook. To enhance the learning environment the use of posters, models and videos may be added. Patients will review the foundational principles of the Pritikin Eating Plan and understand what constitutes a serving size in each of the food groups. Patients will also learn Pritikin-friendly foods that are better choices when away from home and review make-ahead meal and snack options. Calorie density will be reviewed and applied to three nutrition priorities: weight maintenance, weight loss, and weight gain. The purpose of this lesson is to reinforce (in a group setting) the key concepts around what patients are recommended to eat and how to apply these guidelines when away from home by planning and selecting Pritikin-friendly options. Patients will understand how calorie density may be adjusted for different weight management goals.  Mindful Eating  Clinical staff led group instruction and group discussion with PowerPoint presentation and patient guidebook. To enhance the learning environment the use of posters, models and videos may be added. Patients will briefly review the concepts of the Pritikin Eating Plan and the importance of low-calorie dense foods. The concept of mindful eating will be introduced as well as the importance of paying attention to internal hunger signals. Triggers for non-hunger eating and techniques for dealing with triggers will be explored. The purpose of this lesson is to provide patients with the opportunity to review the basic principles of the Pritikin Eating Plan, discuss the value of eating mindfully and how to measure internal cues of hunger and fullness using the Hunger Scale. Patients will also discuss reasons for non-hunger eating and learn strategies to use for controlling emotional eating.  Targeting Your Nutrition Priorities Clinical staff led group instruction and group discussion with PowerPoint presentation and  patient guidebook. To enhance the learning environment the use of posters, models and videos may be added. Patients will learn how to determine their genetic susceptibility to disease  by reviewing their family history. Patients will gain insight into the importance of diet as part of an overall healthy lifestyle in mitigating the impact of genetics and other environmental insults. The purpose of this lesson is to provide patients with the opportunity to assess their personal nutrition priorities by looking at their family history, their own health history and current risk factors. Patients will also be able to discuss ways of prioritizing and modifying the Pritikin Eating Plan for their highest risk areas  Menu  Clinical staff led group instruction and group discussion with PowerPoint presentation and patient guidebook. To enhance the learning environment the use of posters, models and videos may be added. Using menus brought in from [employer], or printed from [employer], patients will apply the Pritikin dining out guidelines that were presented in the [employer] video. Patients will also be able to practice these guidelines in a variety of provided scenarios. The purpose of this lesson is to provide patients with the opportunity to practice hands-on learning of the Pritikin Dining Out guidelines with actual menus and practice scenarios.  Label Reading Clinical staff led group instruction and group discussion with PowerPoint presentation and patient guidebook. To enhance the learning environment the use of posters, models and videos may be added. Patients will review and discuss the Pritikin label reading guidelines presented in Pritikins Label Reading Educational series video. Using fool labels brought in from local grocery stores and markets, patients will apply the label reading guidelines and determine if the packaged food meet the Pritikin guidelines. The purpose of this  lesson is to provide patients with the opportunity to review, discuss, and practice hands-on learning of the Pritikin Label Reading guidelines with actual packaged food labels. Cooking School  Pritikins [employer] are designed to teach patients ways to prepare quick, simple, and affordable recipes at home. The importance of nutritions role in chronic disease risk reduction is reflected in its emphasis in the overall Pritikin program. By learning how to prepare essential core Pritikin Eating Plan recipes, patients will increase control over what they eat; be able to customize the flavor of foods without the use of added salt, sugar, or fat; and improve the quality of the food they consume. By learning a set of core recipes which are easily assembled, quickly prepared, and affordable, patients are more likely to prepare more healthy foods at home. These workshops focus on convenient breakfasts, simple entres, side dishes, and desserts which can be prepared with minimal effort and are consistent with nutrition recommendations for cardiovascular risk reduction. Cooking [employer] are taught by a armed forces logistics/support/administrative officer (RD) who has been trained by the [employer]. The chef or RD has a clear understanding of the importance of minimizing - if not completely eliminating - added fat, sugar, and sodium in recipes. Throughout the series of Cooking School Workshop sessions, patients will learn about healthy ingredients and efficient methods of cooking to build confidence in their capability to prepare    Cooking School weekly topics:  Adding Flavor- Sodium-Free  Fast and Healthy Breakfasts  Powerhouse Plant-Based Proteins  Satisfying Salads and Dressings  Simple Sides and Sauces  International Cuisine-Spotlight on the [employer] Zones  Delicious Desserts  Savory Soups  [employer] - Meals in a Astronomer Appetizers and Snacks  Comforting Weekend Breakfasts  One-Pot  Wonders   Fast Evening Meals  Landscape Architect Your Pritikin Plate  WORKSHOPS   Healthy Mindset (Psychosocial):  Focused Goals, Sustainable Changes Clinical staff led group instruction and group discussion with PowerPoint presentation and patient guidebook. To enhance the learning environment the use of posters, models and videos may be added. Patients will be able to apply effective goal setting strategies to establish at least one personal goal, and then take consistent, meaningful action toward that goal. They will learn to identify common barriers to achieving personal goals and develop strategies to overcome them. Patients will also gain an understanding of how our mind-set can impact our ability to achieve goals and the importance of cultivating a positive and growth-oriented mind-set. The purpose of this lesson is to provide patients with a deeper understanding of how to set and achieve personal goals, as well as the tools and strategies needed to overcome common obstacles which may arise along the way.  From Head to Heart: The Power of a Healthy Outlook  Clinical staff led group instruction and group discussion with PowerPoint presentation and patient guidebook. To enhance the learning environment the use of posters, models and videos may be added. Patients will be able to recognize and describe the impact of emotions and mood on physical health. They will discover the importance of self-care and explore self-care practices which may work for them. Patients will also learn how to utilize the 4 Cs to cultivate a healthier outlook and better manage stress and challenges. The purpose of this lesson is to demonstrate to patients how a healthy outlook is an essential part of maintaining good health, especially as they continue their cardiac rehab journey.  Healthy Sleep for a Healthy Heart Clinical staff led group instruction and group discussion with PowerPoint presentation and  patient guidebook. To enhance the learning environment the use of posters, models and videos may be added. At the conclusion of this workshop, patients will be able to demonstrate knowledge of the importance of sleep to overall health, well-being, and quality of life. They will understand the symptoms of, and treatments for, common sleep disorders. Patients will also be able to identify daytime and nighttime behaviors which impact sleep, and they will be able to apply these tools to help manage sleep-related challenges. The purpose of this lesson is to provide patients with a general overview of sleep and outline the importance of quality sleep. Patients will learn about a few of the most common sleep disorders. Patients will also be introduced to the concept of sleep hygiene, and discover ways to self-manage certain sleeping problems through simple daily behavior changes. Finally, the workshop will motivate patients by clarifying the links between quality sleep and their goals of heart-healthy living.   Recognizing and Reducing Stress Clinical staff led group instruction and group discussion with PowerPoint presentation and patient guidebook. To enhance the learning environment the use of posters, models and videos may be added. At the conclusion of this workshop, patients will be able to understand the types of stress reactions, differentiate between acute and chronic stress, and recognize the impact that chronic stress has on their health. They will also be able to apply different coping mechanisms, such as reframing negative self-talk. Patients will have the opportunity to practice a variety of stress management techniques, such as deep abdominal breathing, progressive muscle relaxation, and/or guided imagery.  The purpose of this lesson is to educate patients on the role of stress in their lives and to provide healthy techniques for coping with it.  Learning Barriers/Preferences:  Learning  Barriers/Preferences - 03/29/24 1344       Learning Barriers/Preferences  Learning Barriers Sight    Learning Preferences Audio;Computer/Internet;Group Instruction;Individual Instruction;Pictoral;Skilled Demonstration;Verbal Instruction;Video;Written Material          Education Topics:  Knowledge Questionnaire Score:  Knowledge Questionnaire Score - 03/29/24 1130       Knowledge Questionnaire Score   Pre Score 23/24          Core Components/Risk Factors/Patient Goals at Admission:  Personal Goals and Risk Factors at Admission - 03/29/24 1130       Core Components/Risk Factors/Patient Goals on Admission    Weight Management Yes;Weight Loss;Obesity    Intervention Weight Management: Develop a combined nutrition and exercise program designed to reach desired caloric intake, while maintaining appropriate intake of nutrient and fiber, sodium and fats, and appropriate energy expenditure required for the weight goal.;Weight Management: Provide education and appropriate resources to help participant work on and attain dietary goals.;Weight Management/Obesity: Establish reasonable short term and long term weight goals.    Goal Weight: Long Term 225 lb (102.1 kg)    Hypertension Yes    Intervention Monitor prescription use compliance.;Provide education on lifestyle modifcations including regular physical activity/exercise, weight management, moderate sodium restriction and increased consumption of fresh fruit, vegetables, and low fat dairy, alcohol  moderation, and smoking cessation.    Expected Outcomes Short Term: Continued assessment and intervention until BP is < 140/49mm HG in hypertensive participants. < 130/30mm HG in hypertensive participants with diabetes, heart failure or chronic kidney disease.;Long Term: Maintenance of blood pressure at goal levels.    Lipids Yes    Intervention Provide education and support for participant on nutrition & aerobic/resistive exercise along with  prescribed medications to achieve LDL 70mg , HDL >40mg .    Expected Outcomes Short Term: Participant states understanding of desired cholesterol values and is compliant with medications prescribed. Participant is following exercise prescription and nutrition guidelines.;Long Term: Cholesterol controlled with medications as prescribed, with individualized exercise RX and with personalized nutrition plan. Value goals: LDL < 70mg , HDL > 40 mg.    Stress Yes    Intervention Offer individual and/or small group education and counseling on adjustment to heart disease, stress management and health-related lifestyle change. Teach and support self-help strategies.;Refer participants experiencing significant psychosocial distress to appropriate mental health specialists for further evaluation and treatment. When possible, include family members and significant others in education/counseling sessions.    Expected Outcomes Short Term: Participant demonstrates changes in health-related behavior, relaxation and other stress management skills, ability to obtain effective social support, and compliance with psychotropic medications if prescribed.;Long Term: Emotional wellbeing is indicated by absence of clinically significant psychosocial distress or social isolation.          Core Components/Risk Factors/Patient Goals Review:   Goals and Risk Factor Review     Row Name 04/04/24 1705 04/17/24 1620 05/15/24 1628         Core Components/Risk Factors/Patient Goals Review   Personal Goals Review Weight Management/Obesity;Hypertension;Lipids;Stress Weight Management/Obesity;Hypertension;Lipids;Stress Weight Management/Obesity;Hypertension;Lipids;Stress     Review Demarious started cardiac rehab on 04/02/24. Hagan did well with exercise. Vital signs were stable. Clary started cardiac rehab on 04/02/24 and has attended one additional CR session since.  Vital signs stable. Kaydin is doing well with exercise at cardiac  rehab when in attendance. Attendance has been sporadic..  Vital signs  have been stable.last day of attendance was on 05/02/24.     Expected Outcomes Nekota will continue to participate in cardiac rehab for exercise, nutrition and lifestyle modifications Hazel will continue to participate in cardiac rehab for exercise, nutrition  and lifestyle modifications Murle will continue to participate in cardiac rehab for exercise, nutrition and lifestyle modifications        Core Components/Risk Factors/Patient Goals at Discharge (Final Review):   Goals and Risk Factor Review - 05/15/24 1628       Core Components/Risk Factors/Patient Goals Review   Personal Goals Review Weight Management/Obesity;Hypertension;Lipids;Stress    Review Nicodemus is doing well with exercise at cardiac rehab when in attendance. Attendance has been sporadic..  Vital signs  have been stable.last day of attendance was on 05/02/24.    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 Introduction to [facility]ikin education/intensive cardiac rehab. Initial orientation packet reviewed with patient. 30 Day ITP Review. Tyaire started cardiac rehab on 04/02/24. Anish did well with exercise. 30 Day ITP Review. Kidus started cardiac rehab on 04/02/24 and has attended one CR session since. 30 Day ITP Review. Jonatha does well with cardiac rehab when in attendance. last exercise session was on 05/02/24. Shearon has only attended a few exercise sessions.       Comments: See ITP comments.Hadassah Elpidio Quan RN BSN     [1]  Current Outpatient Medications:    amLODipine  (NORVASC ) 10 MG tablet, Take 1 tablet (10 mg total) by mouth daily., Disp: 90 tablet, Rfl: 3   apixaban  (ELIQUIS ) 5 MG TABS tablet, Take 1  tablet (5 mg total) by mouth 2 (two) times daily., Disp: 60 tablet, Rfl: 11   aspirin  EC 81 MG tablet, Take 81 mg by mouth daily. Swallow whole., Disp: , Rfl:    atorvastatin  (LIPITOR ) 80 MG tablet, Take 1 tablet (80 mg total) by mouth daily at 6 PM., Disp: 90 tablet, Rfl: 3   Glycerin-Hypromellose-PEG 400 (DRY EYE RELIEF DROPS OP), Place 1 drop into both eyes daily as needed (Dry eyes)., Disp: , Rfl:    Menthol, Topical Analgesic, (BIOFREEZE EX), Apply 1 Application topically daily as needed (pain)., Disp: , Rfl:    metoprolol  tartrate (LOPRESSOR ) 25 MG tablet, Take 1 tablet (25 mg total) by mouth 2 (two) times daily., Disp: 180 tablet, Rfl: 3   Multiple Vitamin (MULTIVITAMIN WITH MINERALS) TABS tablet, Take 1 tablet by mouth daily., Disp: , Rfl:    potassium chloride  SA (KLOR-CON  M) 20 MEQ tablet, Take 2 tablets (40 mEq total) by mouth once for 1 dose. Take after 12:00PM on 09/06 (Patient not taking: Reported on 05/03/2024), Disp: 2 tablet, Rfl: 0   sildenafil (REVATIO) 20 MG tablet, Take 40 mg by mouth daily as needed (ED)., Disp: , Rfl:    traMADol  (ULTRAM ) 50 MG tablet, Take 1 tablet (50 mg total) by mouth every 6 (six) hours as needed for moderate pain (pain score 4-6)., Disp: 28 tablet, Rfl: 0 No current facility-administered medications for this encounter.  Facility-Administered Medications Ordered in Other Encounters:    [facility], Patient & Family Education, , Does not apply, Once, [place], Linnie KIDD, MD [2]  Social History Tobacco Use  Smoking Status Former   Types: Cigars   Quit date: 01/16/2016   Years since quitting: 8.3  Smokeless Tobacco Never  Tobacco Comments   02/26/2016 might smoke 1/month

## 2024-05-16 ENCOUNTER — Telehealth (HOSPITAL_COMMUNITY): Payer: Self-pay

## 2024-05-16 ENCOUNTER — Encounter (HOSPITAL_COMMUNITY)

## 2024-05-16 NOTE — Telephone Encounter (Signed)
 Spoke with pt regarding his 5x no call/no show and whether or not he plans to continue with cardiac rehab or not.  Encouraged pt to continue with rehab if he is able and to speak with his insurance company regarding upcoming 2026 costs.  Pt stated he would let us  know by Monday 05/21/24 whether or not he would be continuing with CR.

## 2024-05-18 ENCOUNTER — Encounter (HOSPITAL_COMMUNITY): Admission: RE | Admit: 2024-05-18 | Source: Ambulatory Visit

## 2024-05-21 ENCOUNTER — Encounter (HOSPITAL_COMMUNITY)

## 2024-05-23 ENCOUNTER — Encounter (HOSPITAL_COMMUNITY): Admission: RE | Admit: 2024-05-23 | Source: Ambulatory Visit

## 2024-05-23 ENCOUNTER — Telehealth (HOSPITAL_COMMUNITY): Payer: Self-pay | Admitting: *Deleted

## 2024-05-23 NOTE — Telephone Encounter (Signed)
 Left message to call cardiac rehab. Will discharge due to nonattendance.Hadassah Elpidio Quan RN BSN

## 2024-05-25 ENCOUNTER — Encounter (HOSPITAL_COMMUNITY): Admission: RE | Admit: 2024-05-25 | Source: Ambulatory Visit

## 2024-05-25 ENCOUNTER — Encounter (HOSPITAL_COMMUNITY): Payer: Self-pay | Admitting: *Deleted

## 2024-05-25 DIAGNOSIS — Z951 Presence of aortocoronary bypass graft: Secondary | ICD-10-CM

## 2024-05-25 NOTE — Progress Notes (Signed)
 Jeffrey [name] attended 10 exercise sessions between 03/29/24-05/02/24. Jeffrey [name] did well with exercise when in attendance. Jeffrey [name] has been discharged due to nonattendance.Denna Elpidio Quan RN BSN

## 2024-05-28 ENCOUNTER — Encounter (HOSPITAL_COMMUNITY)

## 2024-05-30 ENCOUNTER — Encounter (HOSPITAL_COMMUNITY)

## 2024-06-01 ENCOUNTER — Encounter (HOSPITAL_COMMUNITY)

## 2024-06-04 ENCOUNTER — Encounter (HOSPITAL_COMMUNITY)

## 2024-06-06 ENCOUNTER — Encounter (HOSPITAL_COMMUNITY)

## 2024-06-08 ENCOUNTER — Encounter (HOSPITAL_COMMUNITY)

## 2024-06-11 ENCOUNTER — Encounter (HOSPITAL_COMMUNITY)

## 2024-06-13 ENCOUNTER — Encounter (HOSPITAL_COMMUNITY)

## 2024-06-15 ENCOUNTER — Encounter (HOSPITAL_COMMUNITY)

## 2024-06-18 ENCOUNTER — Encounter (HOSPITAL_COMMUNITY)

## 2024-06-20 ENCOUNTER — Encounter (HOSPITAL_COMMUNITY)
